# Patient Record
Sex: Male | Born: 1950 | Race: Black or African American | Hispanic: No | Marital: Married | Smoking: Never smoker
Health system: Southern US, Community
[De-identification: ages and names within clinical notes are randomized; demographics above are authoritative.]

## PROBLEM LIST (undated history)

## (undated) DIAGNOSIS — C801 Malignant (primary) neoplasm, unspecified: Secondary | ICD-10-CM

## (undated) DIAGNOSIS — E669 Obesity, unspecified: Secondary | ICD-10-CM

## (undated) DIAGNOSIS — Q791 Other congenital malformations of diaphragm: Secondary | ICD-10-CM

## (undated) DIAGNOSIS — N400 Enlarged prostate without lower urinary tract symptoms: Secondary | ICD-10-CM

## (undated) DIAGNOSIS — R7303 Prediabetes: Secondary | ICD-10-CM

## (undated) DIAGNOSIS — G4733 Obstructive sleep apnea (adult) (pediatric): Secondary | ICD-10-CM

## (undated) DIAGNOSIS — I5189 Other ill-defined heart diseases: Secondary | ICD-10-CM

## (undated) DIAGNOSIS — Z972 Presence of dental prosthetic device (complete) (partial): Secondary | ICD-10-CM

## (undated) DIAGNOSIS — E7800 Pure hypercholesterolemia, unspecified: Secondary | ICD-10-CM

## (undated) DIAGNOSIS — I10 Essential (primary) hypertension: Secondary | ICD-10-CM

## (undated) DIAGNOSIS — K08109 Complete loss of teeth, unspecified cause, unspecified class: Secondary | ICD-10-CM

## (undated) DIAGNOSIS — K219 Gastro-esophageal reflux disease without esophagitis: Secondary | ICD-10-CM

## (undated) DIAGNOSIS — Z87442 Personal history of urinary calculi: Secondary | ICD-10-CM

## (undated) DIAGNOSIS — E559 Vitamin D deficiency, unspecified: Secondary | ICD-10-CM

## (undated) DIAGNOSIS — I5032 Chronic diastolic (congestive) heart failure: Secondary | ICD-10-CM

## (undated) DIAGNOSIS — G4730 Sleep apnea, unspecified: Secondary | ICD-10-CM

## (undated) DIAGNOSIS — E119 Type 2 diabetes mellitus without complications: Secondary | ICD-10-CM

## (undated) DIAGNOSIS — M1990 Unspecified osteoarthritis, unspecified site: Secondary | ICD-10-CM

## (undated) HISTORY — PX: COLONOSCOPY: SHX174

---

## 1994-02-17 DIAGNOSIS — C642 Malignant neoplasm of left kidney, except renal pelvis: Secondary | ICD-10-CM

## 1994-02-17 DIAGNOSIS — C801 Malignant (primary) neoplasm, unspecified: Secondary | ICD-10-CM

## 1994-02-17 HISTORY — DX: Malignant (primary) neoplasm, unspecified: C80.1

## 1994-02-17 HISTORY — DX: Malignant neoplasm of left kidney, except renal pelvis: C64.2

## 1994-02-17 HISTORY — PX: OTHER SURGICAL HISTORY: SHX169

## 2004-08-16 ENCOUNTER — Ambulatory Visit: Payer: Self-pay | Admitting: Gastroenterology

## 2004-09-17 ENCOUNTER — Emergency Department: Payer: Self-pay | Admitting: Emergency Medicine

## 2006-02-06 ENCOUNTER — Ambulatory Visit: Payer: Self-pay | Admitting: Family Medicine

## 2006-02-25 ENCOUNTER — Ambulatory Visit: Payer: Self-pay

## 2007-02-21 ENCOUNTER — Emergency Department: Payer: Self-pay | Admitting: Emergency Medicine

## 2007-02-21 ENCOUNTER — Other Ambulatory Visit: Payer: Self-pay

## 2007-07-23 ENCOUNTER — Ambulatory Visit: Payer: Self-pay | Admitting: Family Medicine

## 2007-08-10 ENCOUNTER — Ambulatory Visit: Payer: Self-pay | Admitting: Family Medicine

## 2007-08-18 ENCOUNTER — Ambulatory Visit: Payer: Self-pay | Admitting: Oncology

## 2007-08-30 ENCOUNTER — Ambulatory Visit: Payer: Self-pay | Admitting: Oncology

## 2007-09-18 ENCOUNTER — Ambulatory Visit: Payer: Self-pay | Admitting: Oncology

## 2009-01-06 IMAGING — CR METASTATIC BONE SURVEY
1 series · 8 of 8 positions shown · non-contrast
Comparison: none

REASON FOR EXAM: mgus
COMMENTS:

[Series 1: view not recorded · 0.17mm/px · 8 of 21 slices shown]
[im 1/21]
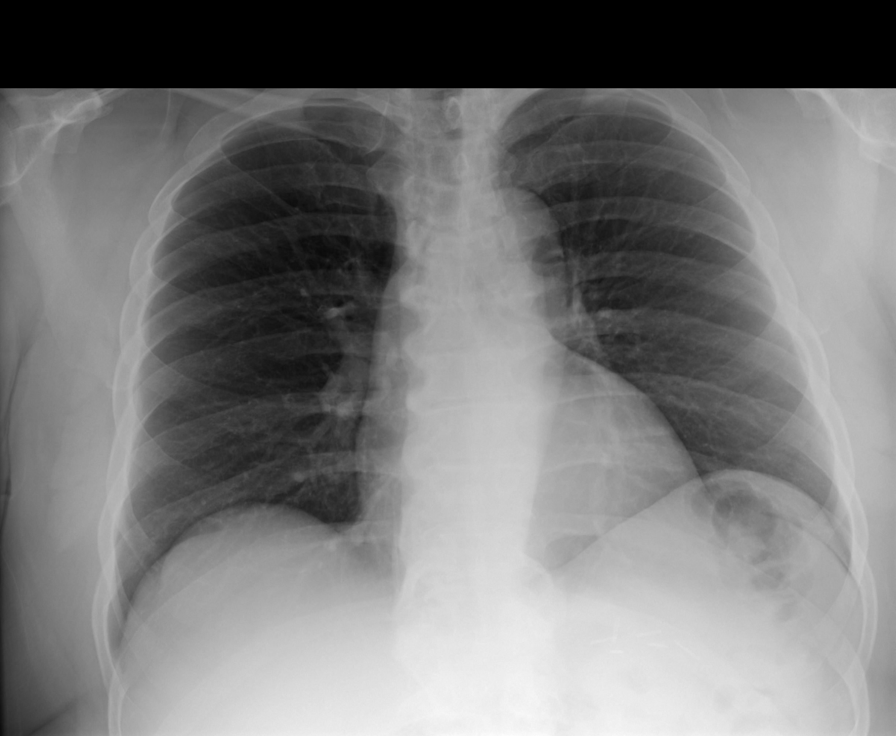
[im 3/21]
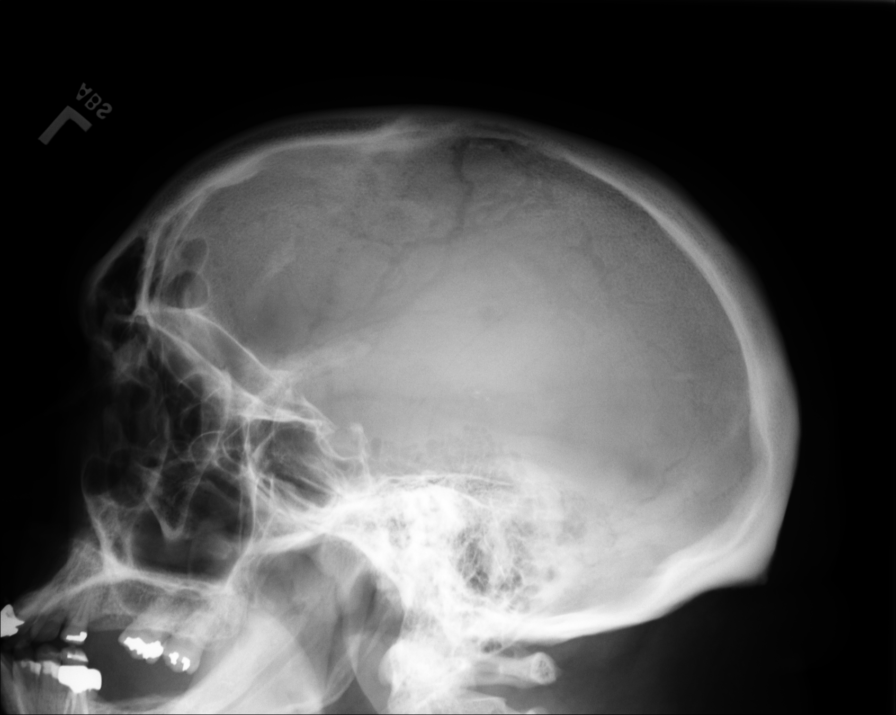
[im 6/21]
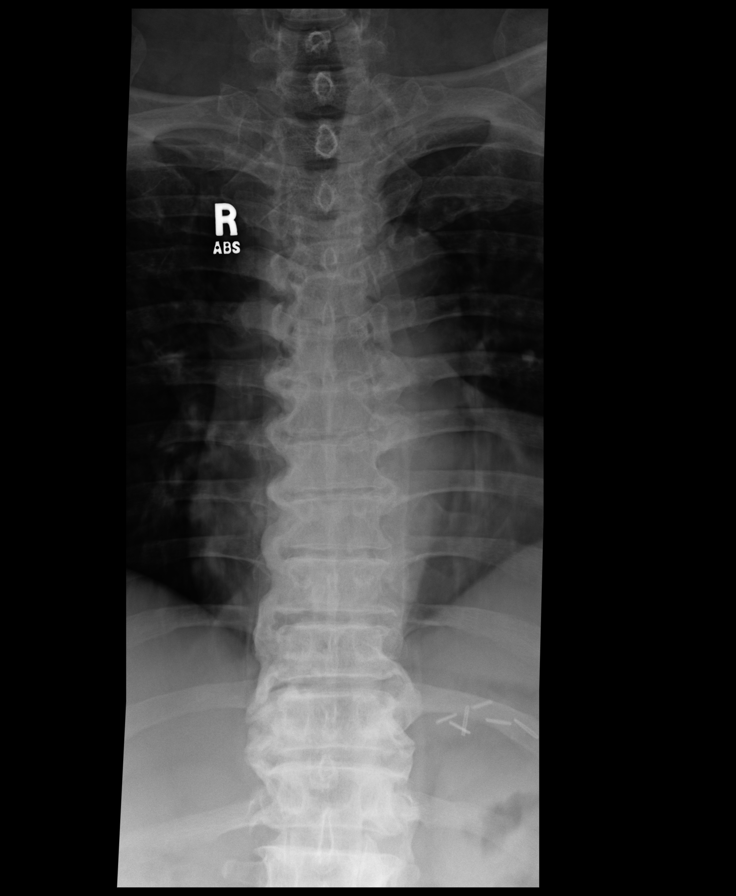
[im 9/21]
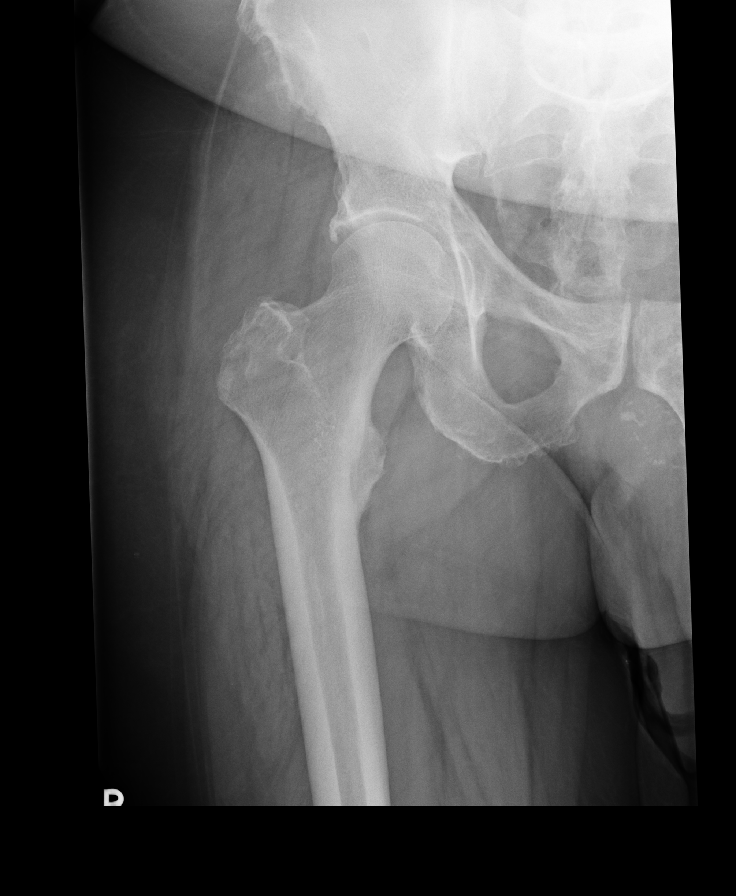
[im 12/21]
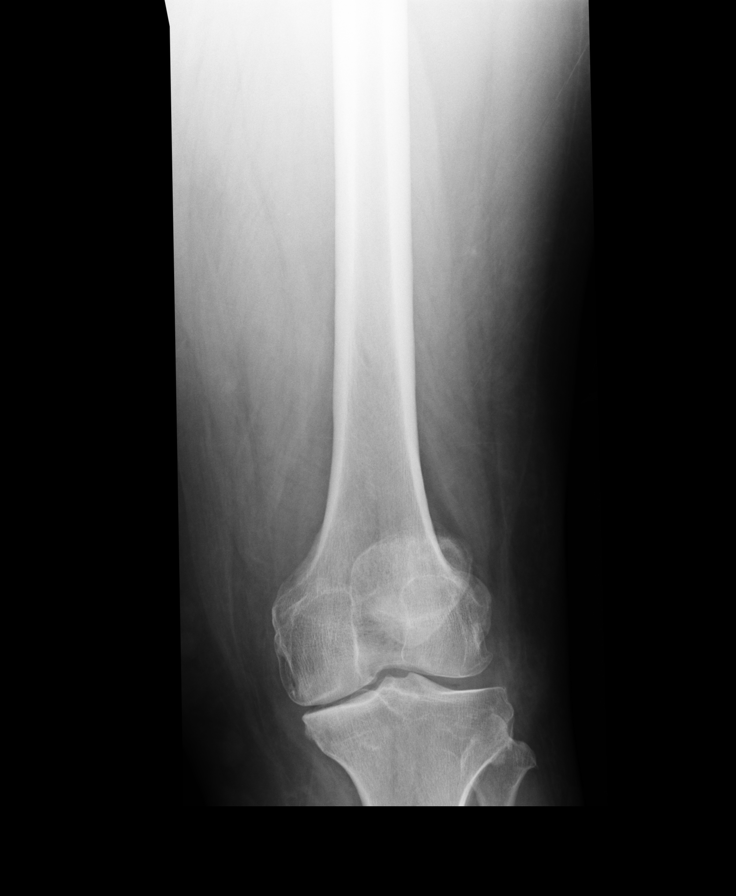
[im 15/21]
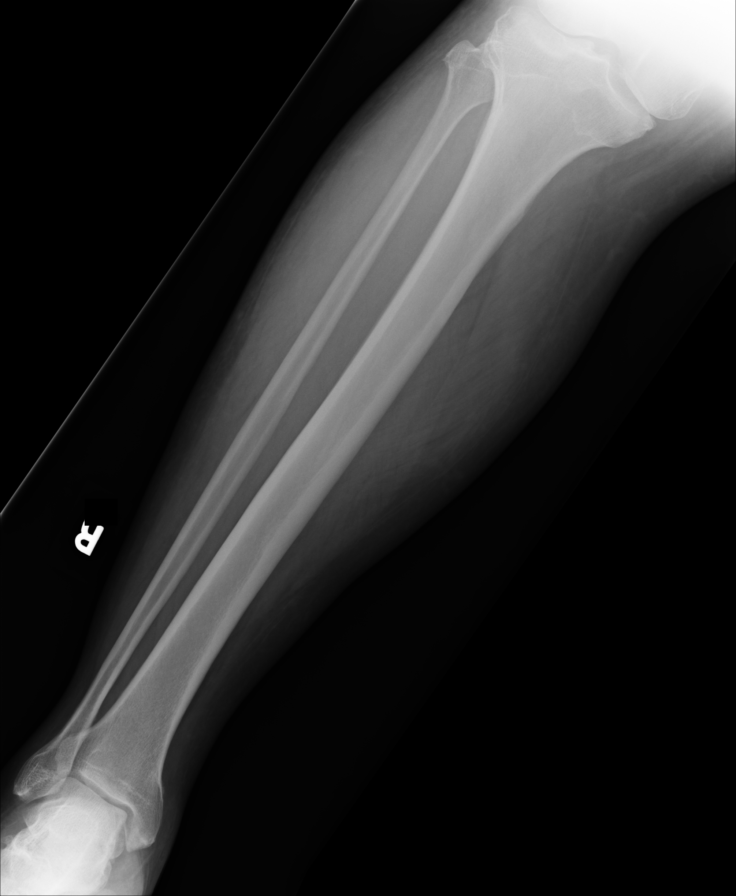
[im 18/21]
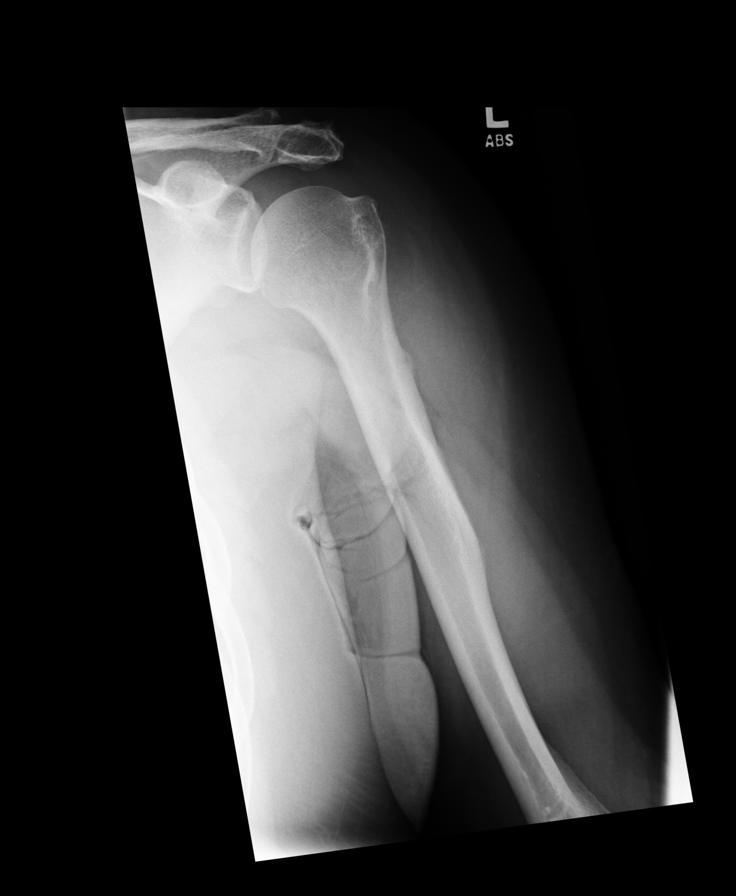
[im 21/21]
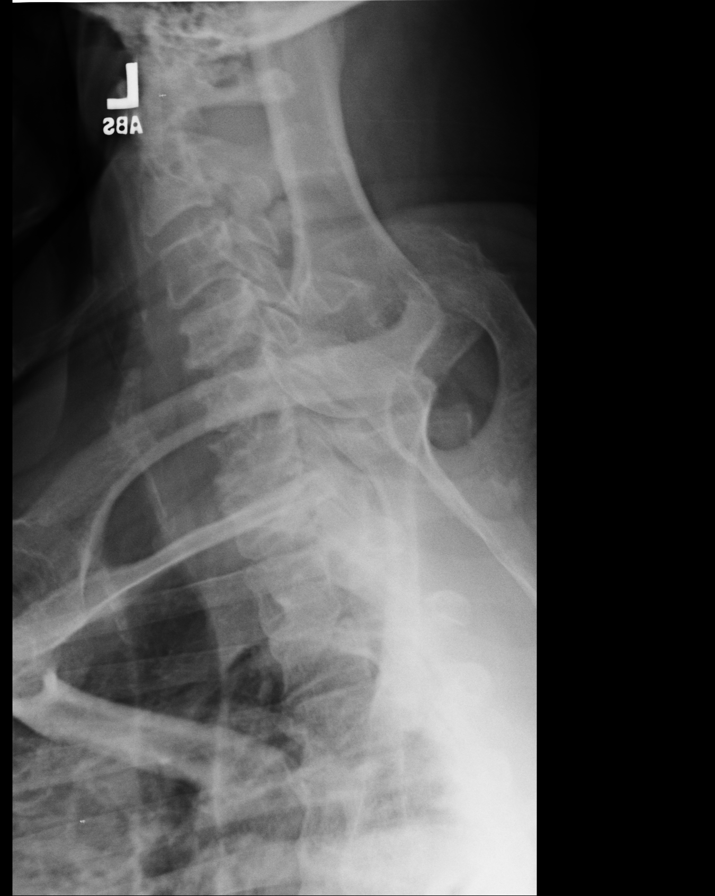

[8 of 8 positions shown; findings below may reference images not displayed]

PROCEDURE:     DXR - DXR BONE SURVEY METASTATIC  - August 30, 2007  [DATE]

RESULT:     No evidence of focal bony lesion to suggest metastasis or
metastatic disease. Nevertheless, a whole bone scan and serum studies for
myeloma should be considered if either of these two conditions is suspected.
 Diffuse degenerative change is present.  This is particularly prominent in
the spine.
IMPRESSION: 1.     Negative study for metastatic disease or myeloma.  However, whole
body bone scan and serum protein electrophoresis may prove useful for
further evaluation.
2.     Diffuse degenerative changes.
3.     Also incidental note is made of carotid atherosclerotic vascular
disease.

## 2013-02-17 DIAGNOSIS — C61 Malignant neoplasm of prostate: Secondary | ICD-10-CM

## 2013-02-17 HISTORY — DX: Malignant neoplasm of prostate: C61

## 2013-02-17 HISTORY — PX: OTHER SURGICAL HISTORY: SHX169

## 2013-04-20 LAB — URINALYSIS, COMPLETE
BACTERIA: NONE SEEN
Bilirubin,UR: NEGATIVE
Blood: NEGATIVE
Glucose,UR: NEGATIVE mg/dL (ref 0–75)
KETONE: NEGATIVE
Leukocyte Esterase: NEGATIVE
Nitrite: NEGATIVE
PH: 5 (ref 4.5–8.0)
Protein: NEGATIVE
RBC,UR: <1 /HPF (ref 0–5)
Specific Gravity: 1.021 (ref 1.003–1.030)
Squamous Epithelial: NONE SEEN
WBC UR: <1 /HPF (ref 0–5)

## 2013-04-20 LAB — CBC
HCT: 43.7 % (ref 40.0–52.0)
HGB: 14.1 g/dL (ref 13.0–18.0)
MCH: 30.8 pg (ref 26.0–34.0)
MCHC: 32.2 g/dL (ref 32.0–36.0)
MCV: 96 fL (ref 80–100)
Platelet: 193 10*3/uL (ref 150–440)
RBC: 4.57 10*6/uL (ref 4.40–5.90)
RDW: 14.9 % — ABNORMAL HIGH (ref 11.5–14.5)
WBC: 6.6 10*3/uL (ref 3.8–10.6)

## 2013-04-20 LAB — COMPREHENSIVE METABOLIC PANEL
ALK PHOS: 72 U/L
AST: 18 U/L (ref 15–37)
Albumin: 3.6 g/dL (ref 3.4–5.0)
Anion Gap: 6 — ABNORMAL LOW (ref 7–16)
BILIRUBIN TOTAL: 0.4 mg/dL (ref 0.2–1.0)
BUN: 15 mg/dL (ref 7–18)
CREATININE: 1.26 mg/dL (ref 0.60–1.30)
Calcium, Total: 9.5 mg/dL (ref 8.5–10.1)
Chloride: 103 mmol/L (ref 98–107)
Co2: 28 mmol/L (ref 21–32)
EGFR (African American): >60
EGFR (Non-African Amer.): 60 — ABNORMAL LOW
Glucose: 101 mg/dL — ABNORMAL HIGH (ref 65–99)
OSMOLALITY: 275 (ref 275–301)
POTASSIUM: 3.5 mmol/L (ref 3.5–5.1)
SGPT (ALT): 23 U/L (ref 12–78)
Sodium: 137 mmol/L (ref 136–145)
Total Protein: 8.2 g/dL (ref 6.4–8.2)

## 2013-04-20 LAB — LIPASE, BLOOD: Lipase: 131 U/L (ref 73–393)

## 2013-04-20 LAB — TROPONIN I: Troponin-I: <0.02 ng/mL

## 2013-04-21 ENCOUNTER — Observation Stay: Payer: Self-pay | Admitting: Internal Medicine

## 2013-04-21 LAB — CK-MB
CK-MB: 1.8 ng/mL (ref 0.5–3.6)
CK-MB: 1.6 ng/mL (ref 0.5–3.6)
CK-MB: 1.5 ng/mL (ref 0.5–3.6)

## 2013-04-21 LAB — TROPONIN I

## 2013-07-10 ENCOUNTER — Ambulatory Visit: Payer: Self-pay

## 2013-07-10 LAB — COMPREHENSIVE METABOLIC PANEL
Albumin: 3.6 g/dL (ref 3.4–5.0)
Alkaline Phosphatase: 82 U/L
Anion Gap: 12 (ref 7–16)
BILIRUBIN TOTAL: 0.4 mg/dL (ref 0.2–1.0)
BUN: 10 mg/dL (ref 7–18)
CALCIUM: 9.5 mg/dL (ref 8.5–10.1)
CHLORIDE: 101 mmol/L (ref 98–107)
Co2: 28 mmol/L (ref 21–32)
Creatinine: 1.19 mg/dL (ref 0.60–1.30)
EGFR (African American): >60
EGFR (Non-African Amer.): >60
GLUCOSE: 137 mg/dL — AB (ref 65–99)
Osmolality: 282 (ref 275–301)
Potassium: 4 mmol/L (ref 3.5–5.1)
SGOT(AST): 22 U/L (ref 15–37)
SGPT (ALT): 28 U/L (ref 12–78)
SODIUM: 141 mmol/L (ref 136–145)
TOTAL PROTEIN: 8.4 g/dL — AB (ref 6.4–8.2)

## 2013-07-10 LAB — CBC WITH DIFFERENTIAL/PLATELET
Basophil #: 0 10*3/uL (ref 0.0–0.1)
Basophil %: 0.3 %
EOS PCT: 0 %
Eosinophil #: 0 10*3/uL (ref 0.0–0.7)
HCT: 44.8 % (ref 40.0–52.0)
HGB: 15 g/dL (ref 13.0–18.0)
LYMPHS ABS: 0.9 10*3/uL — AB (ref 1.0–3.6)
LYMPHS PCT: 9.8 %
MCH: 31.8 pg (ref 26.0–34.0)
MCHC: 33.4 g/dL (ref 32.0–36.0)
MCV: 95 fL (ref 80–100)
MONOS PCT: 4.4 %
Monocyte #: 0.4 x10 3/mm (ref 0.2–1.0)
Neutrophil #: 7.5 10*3/uL — ABNORMAL HIGH (ref 1.4–6.5)
Neutrophil %: 85.5 %
Platelet: 199 10*3/uL (ref 150–440)
RBC: 4.71 10*6/uL (ref 4.40–5.90)
RDW: 15.1 % — ABNORMAL HIGH (ref 11.5–14.5)
WBC: 8.8 10*3/uL (ref 3.8–10.6)

## 2013-07-10 LAB — SEDIMENTATION RATE: Erythrocyte Sed Rate: 15 mm/hr (ref 0–20)

## 2013-07-10 LAB — URIC ACID: Uric Acid: 5.5 mg/dL (ref 3.5–7.2)

## 2013-07-20 DIAGNOSIS — R972 Elevated prostate specific antigen [PSA]: Secondary | ICD-10-CM | POA: Insufficient documentation

## 2013-09-02 ENCOUNTER — Ambulatory Visit: Payer: Self-pay | Admitting: Radiation Oncology

## 2014-04-06 DIAGNOSIS — M170 Bilateral primary osteoarthritis of knee: Secondary | ICD-10-CM | POA: Insufficient documentation

## 2014-06-10 NOTE — Discharge Summary (Signed)
PATIENT NAME:  Colin Mccoy, Colin Mccoy MR#:  [id number] DATE OF BIRTH:  November 14, 1950  DATE OF ADMISSION:  04/21/2013 DATE OF DISCHARGE:  04/21/2013  ADMITTING DIAGNOSIS: Chest pain, dizziness.   DISCHARGE DIAGNOSES:  1.  Chest pain was very atypical, seen by cardiology. He has had a recent stress test within the past few months that has been negative.  2.  Dizziness felt to be due to ongoing upper respiratory sinus-related issues. No evidence of arrhythmia or bradycardia.  3.  Morbid obesity.  4.  Constipation.  5.  History of kidney cancer on the left side.  6.  Left partial nephrectomy. 7.  Abnormal chest x-ray. Needs a followup CT and will let his primary care MD arrange this.  CONSULTANTS: Dr. Saralyn Pilar.  PERTINENT LABS AND EVALUATIONS: LFTs were normal. WBC count was normal. Urinalysis was negative for nitrates and leukocytes. Glucose 101, anion gap 6. Chest x-ray showed no acute cardiopulmonary processes. Right upper lobe nodular densities may represent scarring. EKG showed normal sinus rhythm and nonspecific ST-T wave changes. His troponin was less than 0.02 x 3.    HOSPITAL COURSE: The patient is a 64 year old African American male with previous history of having chest pain, who has had a negative stress test. He presented with multiple complaints, including dizziness, abdominal discomfort and bloating sensation. He was seen in the ED and was admitted. The patient had serial cardiac enzymes. The cardiac enzymes remained negative. He was seen by cardiology. They did not feel that his symptoms were cardiac related. He has had some upper airway congestion and other symptoms that could be causing the dizziness. The patient was placed under observation. Serial cardiac enzymes were checked and they were negative. He was ambulated. He was doing much better. He will be treated with supportive care. If no improvement, then he will need ENT and a GI evaluation. At this time, he is stable for discharge.    DISCHARGE MEDICATIONS: The patient was not able to provide [place] with his blood pressure medication and reflux medication, which he is supposed to continue. Along with that, he will be on aspirin 81, 1 tab p.o. daily, Mylanta maximum strength 5 mL 4 times a day as needed, Flonase 1 spray nasally daily to each nostril, Gas-X 1 tab 4 times a day p.r.n. for gas.   DIET: Low-sodium, low-fat, low-cholesterol.   ACTIVITY: As tolerated.   FOLLOWUP: With primary M.D. in 1 to 2 weeks. Follow up with Dr. Saralyn Pilar in 1 to 2 weeks.   TIME SPENT: 35 minutes.  ____________________________ Lafonda Mosses Posey Pronto, MD shp:aw D: 04/22/2013 08:27:12 ET T: 04/22/2013 08:57:57 ET JOB#: [id number]  cc: Bonham Zingale H. Posey Pronto, MD, <Dictator> Alric Seton MD ELECTRONICALLY SIGNED 04/26/2013 15:30

## 2014-06-10 NOTE — Consult Note (Signed)
PATIENT NAME:  Colin Mccoy, Colin Mccoy MR#:  [id number] DATE OF BIRTH:  11-29-1950  DATE OF CONSULTATION:  04/21/2013  REFERRING PHYSICIAN:  [place]. CONSULTING PHYSICIAN:  Isaias Cowman, MD  CHIEF COMPLAINT: Dizziness.   REASON FOR CONSULTATION: Consultation requested for evaluation of chest pain.   HISTORY OF PRESENT ILLNESS: The patient is a 64 year old gentleman with history of hypertension, hyperlipidemia and obesity. The patient reports that he was in his usual state of health until this past week, when he has had intermittent episodes of dizziness as well as vertigo. The patient reports that 3 days prior to admission, the patient was laying in bed and he felt like the room was spinning around. Yesterday, the patient experienced severe dizziness and presented to [facility] Emergency Room. In the Emergency Room, the patient did report that he has had some intermittent chest discomfort as well as abdominal discomfort with associated belching and burning. EKG was nondiagnostic. The patient was admitted to telemetry where he has ruled out for myocardial infarction by CPK, isoenzymes and troponin. The patient currently is chest pain free. The patient has undergone previous cardiac catheterization, which did not reveal evidence for significant coronary artery disease.   PAST MEDICAL HISTORY:  1.  Hypertension.  2.  Hyperlipidemia.  3.  Obesity.  4.  Gastroesophageal reflux disease.   MEDICATIONS: Aspirin 81 mg daily, blood pressure medication, acid reflux medication and cholesterol medication; however, the patient was unable to give definitive names of drugs for dosing.   SOCIAL HISTORY: The patient is married and resides with his wife. He denies tobacco abuse. He drinks 1 beer per day.   FAMILY HISTORY: No immediate family history for coronary artery disease or myocardial infarction.  REVIEW OF SYSTEMS: CONSTITUTIONAL: No fever or chills. The patient does have dizziness as described above. EYES:  No blurry vision. EARS: No hearing loss. RESPIRATORY: No shortness of breath. CARDIOVASCULAR: The patient does have some chest discomfort, which radiates up both from his abdomen. GASTROINTESTINAL: The patient complains of abdominal discomfort and bloating with belching. GENITOURINARY: No dysuria or hematuria. ENDOCRINE: No polyuria or polydipsia. MUSCULOSKELETAL: No arthralgias or myalgias. NEUROLOGICAL: No focal muscle weakness or numbness. The patient does have vertigo and dizziness as described above. PSYCHIATRIC: No depression or anxiety.   PHYSICAL EXAMINATION:  VITAL SIGNS: Blood pressure 116/77, pulse 60, respirations 20, temperature 98.4, pulse oximetry 93%.  HEENT: Pupils equal and reactive to light and accommodation.  NECK: Supple without thyromegaly.  LUNGS: Clear.  HEART: Normal JVP. Normal PMI. Regular rate and rhythm. Normal S1, S2. No appreciable gallop, murmur, or rub.  ABDOMEN: Soft and nontender. Pulses were intact bilaterally.  MUSCULOSKELETAL: Normal muscle tone.  NEUROLOGIC: The patient is alert and oriented x 3. Motor and sensory both grossly intact.   IMPRESSION: A 64 year old gentleman with known normal coronary anatomy by previous cardiac catheterization, who presents with recent history of sinus infection, vertigo and dizziness with intermittent chest discomfort, abdominal bloating and belching with negative stress test 3 months ago.   RECOMMENDATIONS:  1.  I agree with overall current therapy.  2.  Would defer full dose anticoagulation.  3.  Would defer invasive cardiac evaluation at this time in the absence of chest pain, negative troponin with known history of normal coronary anatomy.  4.  May discharge later today if the patient ambulates without difficulty. I will see him in follow-up at which time may consider GI evaluation for persistent abdominal belching, bloating and discomfort consistent with gastroesophageal reflux disease. The patient  may also benefit with  ENT evaluation for recent sinus infection and vertigo. ____________________________ Isaias Cowman, MD ap:aw D: 04/21/2013 09:09:47 ET T: 04/21/2013 09:30:44 ET JOB#: [id number]  cc: Isaias Cowman, MD, <Dictator> Isaias Cowman MD ELECTRONICALLY SIGNED 04/22/2013 13:15

## 2014-06-10 NOTE — H&P (Signed)
PATIENT NAME:  Colin Mccoy, Colin Mccoy MR#:  [id number] DATE OF BIRTH:  [birth year]  DATE OF ADMISSION:  04/21/2013  PRIMARY CARE PHYSICIAN:  Dr. Debbora Dus.  REFERRING PHYSICIAN:  Dr. Beather Arbour.   PRIMARY CARDIOLOGIST:  Dr. Saralyn Pilar.   CHIEF COMPLAINT:  Chest pain, severe dizziness.   HISTORY OF PRESENT ILLNESS:  The patient is a 64 year old African American male with past medical history of hypertension, hyperlipidemia and obesity, sees Dr. Saralyn Pilar as an outpatient, is presenting to the ER with a chief complaint of chest discomfort.  The patient is reporting that he has been having intermittent episodes of chest discomfort associated with dizziness for the past one week.  Denies any loss of consciousness.  Not associated with any shortness of breath and no radiation of the pain.  He denies any diaphoresis.  The patient is reporting that he was seen by Dr. Saralyn Pilar approximately three months ago and had a stress test done which was normal.  In the ER, the patient's initial troponin is negative.  The patient has received aspirin, nitroglycerin and Lovenox 1 mg/kg subQ was given by the ER physician.  During my examination, the patient is feeling comfortable and denies any chest pain or shortness of breath.  The patient's brother-in-law is at bedside.   PAST MEDICAL HISTORY:  Hypertension, hyperlipidemia and obesity, constipation, history of kidney cancer on the left side.   PAST SURGICAL HISTORY:  Left partial nephrectomy.   ALLERGIES:  No known drug allergies.   PSYCHOSOCIAL HISTORY:  Lives at home with wife.  Drinks one beer per day.  Denies any illicit drug usage.  Denies any history of smoking.   FAMILY HISTORY:  Mother had history of colon cancer.  Father with prostate cancer and coronary artery disease.   HOME MEDICATIONS:  Aspirin 81 mg once daily.  Acid reflux medicine, BP medication.  Cholesterol medication.  Stool softener.  Names and frequencies were not documented.   REVIEW OF SYSTEMS:   CONSTITUTIONAL:  Denies fever, fatigue, weakness.  EYES:  Denies blurry vision, glaucoma, cataracts.  EARS, NOSE, THROAT:  Denies epistaxis, discharge.  RESPIRATION:  Denies cough, COPD.  CARDIOVASCULAR:  Complaining of chest discomfort with dizziness.  Denies any palpitations.  GASTROINTESTINAL:  Denies nausea, vomiting, diarrhea, or abdominal pain.  Complaining of abdominal discomfort.  Feels bloated.  GENITOURINARY:  No dysuria or hematuria.  ENDOCRINE:  Denies polyuria, nocturia, thyroid problems.  The patient is obese.  HEMATOLOGIC AND LYMPHATIC:  No anemia, easy bruising, bleeding.  INTEGUMENTARY:  No acne, rash, lesions.  MUSCULOSKELETAL:  No joint pain in the neck and back.  Denies gout.  NEUROLOGIC:  No vertigo, ataxia.  PSYCHIATRIC:  No ADD, OCD.   PHYSICAL EXAMINATION: VITAL SIGNS:  Temperature 98.4, pulse 55, respirations 16 to 18, blood pressure 126/80, pulse ox 98% on 2 liters.  GENERAL APPEARANCE:  Not under acute distress.  Moderately built and obese.  HEENT:  Normocephalic, atraumatic.  Pupils are equally reacting to light and accommodation.  No scleral icterus.  No conjunctival injection.  No sinus tenderness.  No postnasal drip.  NECK:  Supple.  No JVD.  No thyromegaly.  Moist mucous membranes.  LUNGS:  Clear to auscultation bilaterally.  No accessory muscle usage.  No anterior chest wall tenderness on palpation. CARDIAC:  S1, S2 normal.  Regular rate and rhythm.  No murmurs.  GASTROINTESTINAL:  Soft.  Bowel sounds are positive in all four quadrants.  Obese, nontender, nondistended.  No hepatosplenomegaly.  No masses felt.  Abdomen  is distended.  NEUROLOGIC:  Awake, alert, oriented x 3.  Motor and sensory grossly intact.  Cranial nerves II through XII are intact.  Reflexes are 2+.  EXTREMITIES:  No edema.  No cyanosis.  No clubbing.  SKIN:  Warm to touch.  Normal turgor.  No rashes.  No lesions.  MUSCULOSKELETAL:  No joint effusion, tenderness, erythema.  PSYCHIATRIC:   Normal mood and affect.   LABORATORY AND IMAGING STUDIES:  LFTs are normal.  Troponin is normal.  CBC is normal.  Urinalysis yellow in color, clear in appearance.  Bilirubin and ketones are negative, specific gravity 1.021, nitrites and leukocyte esterase are negative.  Chem-8, glucose is at 101.  Anion gap is at 6.  The rest of the Chem-8 is normal.  Chest x-ray PA and lateral:  No acute cardiopulmonary disease,  right upper lobe nodular density may represent scarring or pulmonary nodule.  Follow-up noncontrast CT chest is recommended.  A 12 lead EKG:  Normal sinus rhythm at 71 beats per minute.  Normal PR and QRS interval.  No acute ST-T wave changes.   ASSESSMENT AND PLAN:  A 64 year old African American male presenting to the ER with a chief complaint of one week history of intermittent episodes of chest discomfort associated with dizziness and no loss of consciousness will be admitted with the following assessment and plan.  1.  Chest pain, rule out acute myocardial infarction.  We will admit him to telemetry.  Cycle cardiac biomarkers.  Acute coronary syndrome protocol with oxygen, nitroglycerin, aspirin, beta blocker and statin.  Cardiology consult is placed to Dr. Saralyn Pilar.  The patient is reporting that recent stress test done three months ago was normal.  2.  Hypertension.  Blood pressure is stable at this time.  The patient will be on beta blocker, metoprolol 25 mg by mouth twice daily and we will resume his home medications once medications are reconciled.  3.  Hyperlipidemia.  The patient will be on statin and we will check fasting lipid panel.  4.  History of pulmonary nodule.  Radiologist is recommending a noncontrast CT of the chest as a follow-up on a non-emergent basis.  5.  Obesity.  The patient will be benefited with diet and lifestyle modifications.  6.  CODE STATUS:  HE IS FULL CODE.   Plan of care discussed with the patient.  Total time spent on the admission is 45 minutes.     ____________________________ Nicholes Mango, MD ag:ea D: 04/21/2013 01:59:38 ET T: 04/21/2013 03:55:08 ET JOB#: [id number]  cc: Nicholes Mango, MD, <Dictator> Nicholes Mango MD ELECTRONICALLY SIGNED 04/27/2013 4:48

## 2014-09-29 DIAGNOSIS — K219 Gastro-esophageal reflux disease without esophagitis: Secondary | ICD-10-CM | POA: Insufficient documentation

## 2014-09-29 DIAGNOSIS — C61 Malignant neoplasm of prostate: Secondary | ICD-10-CM | POA: Insufficient documentation

## 2014-09-29 DIAGNOSIS — R079 Chest pain, unspecified: Secondary | ICD-10-CM | POA: Insufficient documentation

## 2014-09-29 DIAGNOSIS — R5383 Other fatigue: Secondary | ICD-10-CM | POA: Insufficient documentation

## 2014-09-29 DIAGNOSIS — E6609 Other obesity due to excess calories: Secondary | ICD-10-CM | POA: Insufficient documentation

## 2014-12-29 DIAGNOSIS — R300 Dysuria: Secondary | ICD-10-CM | POA: Insufficient documentation

## 2015-01-30 ENCOUNTER — Observation Stay
Admission: EM | Admit: 2015-01-30 | Discharge: 2015-02-01 | Disposition: A | Discharge status: Home / Self Care | Payer: Medicare Other | Attending: Internal Medicine | Admitting: Internal Medicine

## 2015-01-30 ENCOUNTER — Emergency Department: Payer: Medicare Other

## 2015-01-30 ENCOUNTER — Other Ambulatory Visit: Payer: Self-pay

## 2015-01-30 ENCOUNTER — Encounter: Payer: Self-pay | Admitting: Medical Oncology

## 2015-01-30 DIAGNOSIS — J0130 Acute sphenoidal sinusitis, unspecified: Secondary | ICD-10-CM | POA: Diagnosis not present

## 2015-01-30 DIAGNOSIS — K5900 Constipation, unspecified: Secondary | ICD-10-CM | POA: Insufficient documentation

## 2015-01-30 DIAGNOSIS — M1990 Unspecified osteoarthritis, unspecified site: Secondary | ICD-10-CM | POA: Insufficient documentation

## 2015-01-30 DIAGNOSIS — Z800 Family history of malignant neoplasm of digestive organs: Secondary | ICD-10-CM | POA: Insufficient documentation

## 2015-01-30 DIAGNOSIS — R42 Dizziness and giddiness: Secondary | ICD-10-CM | POA: Insufficient documentation

## 2015-01-30 DIAGNOSIS — M542 Cervicalgia: Secondary | ICD-10-CM | POA: Insufficient documentation

## 2015-01-30 DIAGNOSIS — E7800 Pure hypercholesterolemia, unspecified: Secondary | ICD-10-CM | POA: Diagnosis not present

## 2015-01-30 DIAGNOSIS — Z801 Family history of malignant neoplasm of trachea, bronchus and lung: Secondary | ICD-10-CM | POA: Insufficient documentation

## 2015-01-30 DIAGNOSIS — Z905 Acquired absence of kidney: Secondary | ICD-10-CM | POA: Insufficient documentation

## 2015-01-30 DIAGNOSIS — I10 Essential (primary) hypertension: Secondary | ICD-10-CM | POA: Diagnosis not present

## 2015-01-30 DIAGNOSIS — Z9842 Cataract extraction status, left eye: Secondary | ICD-10-CM | POA: Insufficient documentation

## 2015-01-30 DIAGNOSIS — Z7982 Long term (current) use of aspirin: Secondary | ICD-10-CM | POA: Diagnosis not present

## 2015-01-30 DIAGNOSIS — Z79899 Other long term (current) drug therapy: Secondary | ICD-10-CM | POA: Diagnosis not present

## 2015-01-30 DIAGNOSIS — Z6841 Body Mass Index (BMI) 40.0 and over, adult: Secondary | ICD-10-CM | POA: Diagnosis not present

## 2015-01-30 DIAGNOSIS — N400 Enlarged prostate without lower urinary tract symptoms: Secondary | ICD-10-CM | POA: Diagnosis not present

## 2015-01-30 DIAGNOSIS — M25512 Pain in left shoulder: Secondary | ICD-10-CM | POA: Insufficient documentation

## 2015-01-30 DIAGNOSIS — Z842 Family history of other diseases of the genitourinary system: Secondary | ICD-10-CM | POA: Diagnosis not present

## 2015-01-30 DIAGNOSIS — R9431 Abnormal electrocardiogram [ECG] [EKG]: Secondary | ICD-10-CM | POA: Insufficient documentation

## 2015-01-30 DIAGNOSIS — E785 Hyperlipidemia, unspecified: Secondary | ICD-10-CM | POA: Diagnosis not present

## 2015-01-30 DIAGNOSIS — K219 Gastro-esophageal reflux disease without esophagitis: Secondary | ICD-10-CM | POA: Diagnosis not present

## 2015-01-30 DIAGNOSIS — Z8546 Personal history of malignant neoplasm of prostate: Secondary | ICD-10-CM | POA: Insufficient documentation

## 2015-01-30 HISTORY — DX: Unspecified osteoarthritis, unspecified site: M19.90

## 2015-01-30 HISTORY — DX: Pure hypercholesterolemia, unspecified: E78.00

## 2015-01-30 HISTORY — DX: Malignant (primary) neoplasm, unspecified: C80.1

## 2015-01-30 HISTORY — DX: Benign prostatic hyperplasia without lower urinary tract symptoms: N40.0

## 2015-01-30 HISTORY — DX: Essential (primary) hypertension: I10

## 2015-01-30 HISTORY — DX: Gastro-esophageal reflux disease without esophagitis: K21.9

## 2015-01-30 LAB — BASIC METABOLIC PANEL
Anion gap: 12 (ref 5–15)
BUN: 11 mg/dL (ref 6–20)
CALCIUM: 9.2 mg/dL (ref 8.9–10.3)
CHLORIDE: 103 mmol/L (ref 101–111)
CO2: 27 mmol/L (ref 22–32)
CREATININE: 0.9 mg/dL (ref 0.61–1.24)
GFR calc Af Amer: >60 mL/min (ref >60)
GFR calc non Af Amer: >60 mL/min (ref >60)
Glucose, Bld: 149 mg/dL — ABNORMAL HIGH (ref 65–99)
Potassium: 3.8 mmol/L (ref 3.5–5.1)
SODIUM: 142 mmol/L (ref 135–145)

## 2015-01-30 LAB — CBC
HCT: 41 % (ref 40.0–52.0)
Hemoglobin: 13.2 g/dL (ref 13.0–18.0)
MCH: 30.8 pg (ref 26.0–34.0)
MCHC: 32.3 g/dL (ref 32.0–36.0)
MCV: 95.4 fL (ref 80.0–100.0)
PLATELETS: 186 10*3/uL (ref 150–440)
RBC: 4.3 MIL/uL — ABNORMAL LOW (ref 4.40–5.90)
RDW: 14.7 % — AB (ref 11.5–14.5)
WBC: 8.2 10*3/uL (ref 3.8–10.6)

## 2015-01-30 LAB — URINALYSIS COMPLETE WITH MICROSCOPIC (ARMC ONLY)
BILIRUBIN URINE: NEGATIVE
Bacteria, UA: NONE SEEN
GLUCOSE, UA: NEGATIVE mg/dL
Hgb urine dipstick: NEGATIVE
Ketones, ur: NEGATIVE mg/dL
LEUKOCYTES UA: NEGATIVE
Nitrite: NEGATIVE
Protein, ur: NEGATIVE mg/dL
Specific Gravity, Urine: 1.014 (ref 1.005–1.030)
pH: 7 (ref 5.0–8.0)

## 2015-01-30 MED ORDER — DOCUSATE SODIUM 100 MG PO CAPS
100.0000 mg | ORAL_CAPSULE | Freq: Two times a day (BID) | ORAL | Status: DC
  Administered 2015-01-30 – 2015-02-01 (×4): 100 mg via ORAL
  Filled 2015-01-30 (×5): qty 1

## 2015-01-30 MED ORDER — BENAZEPRIL HCL 20 MG PO TABS
20.0000 mg | ORAL_TABLET | Freq: Every day | ORAL | Status: DC
  Administered 2015-01-31 – 2015-02-01 (×2): 20 mg via ORAL
  Filled 2015-01-30 (×2): qty 1

## 2015-01-30 MED ORDER — AMLODIPINE BESYLATE 10 MG PO TABS
10.0000 mg | ORAL_TABLET | Freq: Every day | ORAL | Status: DC
  Administered 2015-01-31 – 2015-02-01 (×2): 10 mg via ORAL
  Filled 2015-01-30 (×2): qty 1

## 2015-01-30 MED ORDER — PANTOPRAZOLE SODIUM 40 MG PO TBEC
40.0000 mg | DELAYED_RELEASE_TABLET | Freq: Every day | ORAL | Status: DC
  Administered 2015-01-30 – 2015-02-01 (×3): 40 mg via ORAL
  Filled 2015-01-30 (×3): qty 1

## 2015-01-30 MED ORDER — PRAVASTATIN SODIUM 20 MG PO TABS
80.0000 mg | ORAL_TABLET | Freq: Every day | ORAL | Status: DC
  Administered 2015-01-31: 80 mg via ORAL
  Filled 2015-01-30 (×3): qty 4

## 2015-01-30 MED ORDER — MECLIZINE HCL 25 MG PO TABS
25.0000 mg | ORAL_TABLET | Freq: Three times a day (TID) | ORAL | Status: DC
  Administered 2015-01-30 – 2015-02-01 (×6): 25 mg via ORAL
  Filled 2015-01-30 (×9): qty 2

## 2015-01-30 MED ORDER — DIAZEPAM 5 MG/ML IJ SOLN
10.0000 mg | Freq: Once | INTRAMUSCULAR | Status: AC
  Administered 2015-01-30: 10 mg via INTRAVENOUS
  Filled 2015-01-30: qty 2

## 2015-01-30 MED ORDER — ASPIRIN EC 81 MG PO TBEC
81.0000 mg | DELAYED_RELEASE_TABLET | Freq: Every day | ORAL | Status: DC
  Administered 2015-01-31 – 2015-02-01 (×2): 81 mg via ORAL
  Filled 2015-01-30 (×2): qty 1

## 2015-01-30 MED ORDER — SODIUM CHLORIDE 0.9 % IV SOLN
Freq: Once | INTRAVENOUS | Status: AC
  Administered 2015-01-30: 17:00:00 via INTRAVENOUS

## 2015-01-30 MED ORDER — TAMSULOSIN HCL 0.4 MG PO CAPS
0.4000 mg | ORAL_CAPSULE | Freq: Every day | ORAL | Status: DC
  Administered 2015-01-30 – 2015-01-31 (×2): 0.4 mg via ORAL
  Filled 2015-01-30 (×2): qty 1

## 2015-01-30 MED ORDER — POLYETHYLENE GLYCOL 3350 17 G PO PACK: 17.0000 g | PACK | Freq: Every day | ORAL | Status: DC | PRN

## 2015-01-30 MED ORDER — AMOXICILLIN-POT CLAVULANATE 875-125 MG PO TABS
1.0000 | ORAL_TABLET | Freq: Two times a day (BID) | ORAL | Status: DC
  Administered 2015-01-30 – 2015-02-01 (×5): 1 via ORAL
  Filled 2015-01-30 (×5): qty 1

## 2015-01-30 MED ORDER — ENOXAPARIN SODIUM 40 MG/0.4ML ~~LOC~~ SOLN
40.0000 mg | Freq: Two times a day (BID) | SUBCUTANEOUS | Status: DC
  Administered 2015-01-30 – 2015-02-01 (×4): 40 mg via SUBCUTANEOUS
  Filled 2015-01-30 (×4): qty 0.4

## 2015-01-30 NOTE — ED Provider Notes (Addendum)
[facility] Emergency Department Provider Note  Time seen: 11:44 AM  I have reviewed the triage vital signs and the nursing notes.   HISTORY  Chief Complaint Dizziness    HPI Colin PICARIELLO Sr. is a 64 y.o. male with a past medical history of BPH, hypertension, arthritis, gastric reflux, hyperlipidemia who presents the emergency department with acute onset of dizziness. According to the patient he was sitting in his bed, when he got up he felt acutely dizzy as if the bed was spinning around him.Denies any headache, focal weakness or numbness. Denies ear pain. Denies history of vertigo.     Past Medical History  Diagnosis Date  . BPH (benign prostatic hyperplasia)   . Cancer [facility])     Prostate CA  . Hypertension   . Arthritis   . GERD (gastroesophageal reflux disease)   . High cholesterol     There are no active problems to display for this patient.   No past surgical history on file.  No current outpatient prescriptions on file.  Allergies Review of patient's allergies indicates no known allergies.  No family history on file.  Social History Social History  Substance Use Topics  . Smoking status: Never Smoker   . Smokeless tobacco: None  . Alcohol Use: None    Review of Systems Constitutional: Negative for fever. Cardiovascular: Negative for chest pain. Respiratory: Negative for shortness of breath. Gastrointestinal: Negative for abdominal pain Musculoskeletal: Negative for back pain. Neurological: Negative for headache 10-point ROS otherwise negative.  ____________________________________________   PHYSICAL EXAM:  VITAL SIGNS: ED Triage Vitals  Enc Vitals Group     BP 01/30/15 1044 159/83 mmHg     Pulse Rate 01/30/15 1044 63     Resp 01/30/15 1044 22     Temp 01/30/15 1044 98.4 F (36.9 C)     Temp Source 01/30/15 1044 Oral     SpO2 01/30/15 1044 99 %     Weight 01/30/15 1044 350 lb (158.759 kg)     Height 01/30/15 1044  5\' 9"  (1.753 m)     Head Cir --      Peak Flow --      Pain Score 01/30/15 1106 3     Pain Loc --      Pain Edu? --      Excl. in [place]? --     Constitutional: Alert and oriented. Well appearing and in no distress. Eyes: Normal exam, beating nystagmus on exam, PERRL ENT   Head: Normocephalic and atraumatic   Mouth/Throat: Mucous membranes are moist. Cardiovascular: Normal rate, regular rhythm. No murmur Respiratory: Normal respiratory effort without tachypnea nor retractions. Breath sounds are clear  Gastrointestinal: Soft and nontender. No distention.  Musculoskeletal: Nontender with normal range of motion in all extremities. Neurologic:  Normal speech and language. No gross focal neurologic deficits Skin:  Skin is warm, dry and intact.  Psychiatric: Mood and affect are normal. Speech and behavior are normal.  ____________________________________________   RADIOLOGY  CT head negative  EKG reviewed and interpreted by myself shows normal sinus rhythm at 63 bpm, narrow QRS, left axis deviation, normal intervals, nonspecific ST changes. ____________________________________________    INITIAL IMPRESSION / ASSESSMENT AND PLAN / ED COURSE  Pertinent labs & imaging results that were available during my care of the patient were reviewed by me and considered in my medical decision making (see chart for details).  Patient presents with sudden onset of spinning sensation this morning. Patient states feels like  the room is spinning around him, somewhat better with his eyes closed. Patient has nystagmus on exam, history very suggestive of vertigo. We will check labs, CT head to rule out intracranial abnormality. We will treat with IV Valium, and closely monitor in the emergency department.  CT head is negative, labs within normal limits. Exam is very consistent with BPPV. Patient currently sleeping status post Valium administration.  Patient unable to sit up due to significant  vertigo with nausea. Symptoms still most suggestive of BPPV however the patient is unable to sit up, I do not believe he could be safely discharged home at this time. The patient will be admitted for further treatment.  ____________________________________________   FINAL CLINICAL IMPRESSION(S) / ED DIAGNOSES  Vertigo   Harvest Dark, MD 01/30/15 [place], MD 01/30/15 [phone]

## 2015-01-30 NOTE — ED Notes (Signed)
Patient c/o dizziness with the room spinning upon the arrival of this Probation officer. Patient was anxious and throwing out his arm to steady himself, states the bed is moving. Patient is alert and oriented. Wife at bedside.

## 2015-01-30 NOTE — H&P (Signed)
[facility] [place] NAME: Colin Mccoy    MR#:  [id number]  DATE OF BIRTH:  11-12-1950  DATE OF ADMISSION:  01/30/2015  PRIMARY CARE PHYSICIAN: [facility] clinic. Dr. Jeananne Rama  REQUESTING/REFERRING PHYSICIAN: [facility]  CHIEF COMPLAINT:   Chief Complaint  Patient presents with  . Dizziness    HISTORY OF PRESENT ILLNESS:  Colin Mccoy  is a 64 y.o. male with a known history of hypertension, hyperlipidemia, gastroesophageal reflux disease. He presents to the emergency room today with dizziness and room spinning starting this a.m. at 8 AM. The ER physician has been watching him for while but they have been unsuccessful on trying to get him up and walking 3 times already today. Hospitalist services were contacted for further evaluation. The patient is also having some neck pain and left shoulder pain going on for 3 weeks. When I went into the room, he was unable to lift his head up off the pillow.  PAST MEDICAL HISTORY:   Past Medical History  Diagnosis Date  . BPH (benign prostatic hyperplasia)   . Cancer [facility])     Prostate CA  . Hypertension   . Arthritis   . GERD (gastroesophageal reflux disease)   . High cholesterol     PAST SURGICAL HISTORY:   Past Surgical History  Procedure Laterality Date  . Partial kidney resection    . Prostate seed implantation      SOCIAL HISTORY:   Social History  Substance Use Topics  . Smoking status: Never Smoker   . Smokeless tobacco: Not on file  . Alcohol Use: Yes    FAMILY HISTORY:   Family History  Problem Relation Age of Onset  . Colon cancer Mother   . Lung cancer Father   . Benign prostatic hyperplasia Father     DRUG ALLERGIES:  No Known Allergies  REVIEW OF SYSTEMS:  CONSTITUTIONAL: No fever, positive for fatigue.  EYES: No blurred or double vision. Wears reading glasses EARS, NOSE, AND THROAT: No tinnitus or ear pain. No sore throat dysphagia to  solid food.  RESPIRATORY:  some cough,  no shortness of breath, wheezing or hemoptysis.  CARDIOVASCULAR: No chest pain, orthopnea, edema.  GASTROINTESTINAL: No nausea, vomiting, diarrhea or abdominal pain. No blood in bowel movements. Positive for constipation  GENITOURINARY:  occasional burning on urination  ENDOCRINE: No polyuria, nocturia,  HEMATOLOGY: No anemia, easy bruising or bleeding SKIN: No rash or lesion. MUSCULOSKELETAL: positive for joint pain and left neck and shoulder pain   NEUROLOGIC: No tingling, numbness, weakness.  PSYCHIATRY: No anxiety or depression.   MEDICATIONS AT HOME:       medication reconciliation not done. Pravastatin 80 mg daily, omeprazole 20 mg daily,, Norvasc 10 mg daily, benazepril 10 mg daily, meloxicam 15 mg daily, aspirin 81 mg daily, Colace 100 mg possibly twice a day, MiraLAX when necessary, Flomax 0.4 mg daily    VITAL SIGNS:  Blood pressure 147/87, pulse 56, temperature 98.4 F (36.9 C), temperature source Oral, resp. rate 19, height 5\' 9"  (1.753 m), weight 158.759 kg (350 lb), SpO2 100 %.  PHYSICAL EXAMINATION:  GENERAL:  64 y.o.-year-old patient lying in the bed with no acute distress.  EYES: Pupils equal, round, reactive to light and accommodation. No scleral icterus. Extraocular muscles intact.  HEENT: Head atraumatic, normocephalic. Oropharynx and nasopharynx clear.  tympanic membrane on the left bulging and erythematous  NECK:  Supple, no jugular venous distention. No thyroid enlargement,  no tenderness.  LUNGS: Normal breath sounds bilaterally, no wheezing, rales,rhonchi or crepitation. No use of accessory muscles of respiration.  CARDIOVASCULAR: S1, S2 normal. No murmurs, rubs, or gallops.  ABDOMEN: Soft, nontender, nondistended. Bowel sounds present. No organomegaly or mass.  EXTREMITIES: 2+  edema,  no cyanosis, or clubbing.  NEUROLOGIC: Cranial nerves II through XII are intact. Muscle strength 5/5 in all extremities. Sensation  intact. Gait not checked.  PSYCHIATRIC: The patient is alert and oriented x 3.  SKIN: No rash, lesion, or ulcer.   LABORATORY PANEL:   CBC  Recent Labs Lab 01/30/15 1133  WBC 8.2  HGB 13.2  HCT 41.0  PLT 186   ------------------------------------------------------------------------------------------------------------------  Chemistries   Recent Labs Lab 01/30/15 1133  NA 142  K 3.8  CL 103  CO2 27  GLUCOSE 149*  BUN 11  CREATININE 0.90  CALCIUM 9.2   ------------------------------------------------------------------------------------------------------------------    RADIOLOGY:  Ct Head Wo Contrast  01/30/2015  CLINICAL DATA:  Dizziness which began after the patient woke up this morning. No known injury. Initial encounter. EXAM: CT HEAD WITHOUT CONTRAST TECHNIQUE: Contiguous axial images were obtained from the base of the skull through the vertex without intravenous contrast. COMPARISON:  None. FINDINGS: There is no evidence of acute intracranial abnormality including hemorrhage, infarct, mass lesion, mass effect and abnormal extra-axial fluid collection. No hydrocephalus or pneumocephalus. The calvarium is intact. Imaged paranasal sinuses and mastoid air cells are clear. IMPRESSION: Negative head CT. Electronically Signed   By: Inge Rise M.D.   On: 01/30/2015 12:51    EKG:   Normal sinus rhythm 63 bpm left axis deviation   IMPRESSION AND PLAN:   1. Benign positional vertigo with otitis media on the left. Since the patient is unable to lift up his head from the pillow today, I will admit as an observation. I will give IV fluid hydration 1 L. Augmentin 875 mg twice a day. Antivert 25 mg 3 times a day. Physical therapy evaluation tomorrow. 2. Essential hypertension continue Norvasc and benazepril 3. hyperlipidemia unspecified continue pravastatin  4. BPH and history of prostate cancer on Flomax 5. Constipation on Colace and MiraLAX 6. Obesity- weight loss  needed 7. Left neck and left shoulder pain- hard to assess with the patient unable to lift his head up off the pillow without feeling dizzy. Better exam will have to be repeated tomorrow.  All the records are reviewed and case discussed with ED provider. Management plans discussed with the patient, family and they are in agreement.  CODE STATUS: Full code   TOTAL TIME TAKING CARE OF THIS PATIENT: 50  minutes.    Loletha Grayer M.D on 01/30/2015 at 3:25 PM  Between 7am to 6pm - Pager - [phone]  After 6pm call admission pager [facility]  Office  [phone]  CC: Primary care physician; [facility] clinic Dr. Jeananne Rama.

## 2015-01-30 NOTE — Progress Notes (Signed)
ANTICOAGULATION CONSULT NOTE - Initial Consult  Pharmacy Consult for Lovenox  Indication: VTE prophylaxis  No Known Allergies  Patient Measurements: Height: 5\' 9"  (175.3 cm) Weight: (!) 350 lb (158.759 kg) IBW/kg (Calculated) : 70.7 Heparin Dosing Weight:   Vital Signs: Temp: 98.4 F (36.9 C) (12/13 1044) Temp Source: Oral (12/13 1044) BP: 138/87 mmHg (12/13 1545) Pulse Rate: 53 (12/13 1545)  Labs:  Recent Labs  01/30/15 1133  HGB 13.2  HCT 41.0  PLT 186  CREATININE 0.90    Estimated Creatinine Clearance: 120.9 mL/min (by C-G formula based on Cr of 0.9).   Medical History: Past Medical History  Diagnosis Date  . BPH (benign prostatic hyperplasia)   . Cancer [facility])     Prostate CA  . Hypertension   . Arthritis   . GERD (gastroesophageal reflux disease)   . High cholesterol     Medications:  Scheduled:  . sodium chloride   Intravenous Once  . [START ON 01/31/2015] amLODipine  10 mg Oral Daily  . amoxicillin-clavulanate  1 tablet Oral Q12H  . [START ON 01/31/2015] aspirin EC  81 mg Oral Daily  . [START ON 01/31/2015] benazepril  20 mg Oral Daily  . docusate sodium  100 mg Oral BID  . enoxaparin (LOVENOX) injection  40 mg Subcutaneous Q12H  . meclizine  25 mg Oral TID  . pantoprazole  40 mg Oral Daily  . pravastatin  80 mg Oral q1800  . tamsulosin  0.4 mg Oral QPC supper    Assessment: CrCl = 120.9 ml/min TBW = 158.8 kg  BMI = 51.8  Goal of Therapy:  DVT prophylaxis   Plan:  Lovenox 40 mg SQ Q24H originally ordered. Will adjust dose to lovenox 40 mg SQ Q12H based on BMI > 40.   Senai Ramnath D 01/30/2015,4:24 PM

## 2015-01-30 NOTE — ED Notes (Signed)
Patient attempted to sit up, but was unable due to worsening dizziness.

## 2015-01-30 NOTE — ED Notes (Signed)
Patient was able to sit on side of the stretcher for 1 minute, but then had to lie down due to dizziness. Dizziness resolved when supine.

## 2015-01-30 NOTE — ED Notes (Signed)
PT reports that he woke up this am with dizziness, pt denies headache. Pt also reports that he has been having sob with exertion. Pt reports left shoulder pain x 1 month without injury.

## 2015-01-31 ENCOUNTER — Observation Stay: Payer: Medicare Other

## 2015-01-31 MED ORDER — DIAZEPAM 5 MG PO TABS: 5.0000 mg | ORAL_TABLET | Freq: Three times a day (TID) | ORAL | Status: DC | PRN

## 2015-01-31 NOTE — Evaluation (Addendum)
Physical Therapy Evaluation Patient Details Name: Colin HONEA Sr. MRN: [id number] DOB: 09-19-1950 Today's Date: 01/31/2015   History of Present Illness  Keyun Trenary is a 64 y.o. male with a known history of hypertension, hyperlipidemia, gastroesophageal reflux disease. He presented to the emergency room yesterday with dizziness and vertigo. The patient is also having some neck pain and left shoulder pain going on for 3 weeks. Pt was unable to lift his head up off the pillow in the ER. ER physician indicates nystagmus on exam but does not clarify direction or quality of nystagmus. Hospitalist reports otitis media in L ear and admitted pt under observation for BPPV. Pt reports that yesterday morning he was laying still in bed when all of a sudden he started to see the room spin. Pt denies rolling or turning in bed. Symptoms lasted for 1-2 seconds initially and then the symptoms returned a second time and it lasted a couple seconds. Pt initially thought it was his blood pressure. He took his BP meds and sat in the living room. Pt went to the restroom and had a bowel movement. When he stood up from the commode he started having vertigo again. Pt reports that occasionally he has trouble focusing and he feels like his visual field is shifting. Pt reports history of L neck and shoulder pain over the last month. Pt describes his symptoms as vertigo, lightheadedness, unsteadiness. Denies syncope. He had multiple episodes yesterday which lasted seconds in duration. Pt reports stress test a couple months ago which was normal. Symptoms are motion provoked and occur with head changes and position changes. Easing factors are resting and closing his eyes. Pt states that his symptoms are improved today. He has no prior history of similar episodes. No reported falls in the last 12 months. Pt was previously independent with ADLs/IADLs without assistive device. Pt denies tinnitus, pain, or ear drainage. No diplopia or recent  changes in vision. Pt denies nausea/vomiting, dysarthria, dysphagia, drop attacks, bowel and bladder changes, recent weight loss, headache, migraines,  Clinical Impression  Upon arrival pt reports improving symptoms. Pt with normal ocular alignment and full ocular ROM. L horizontal nystagmus noted at mid range L gaze which is absent in central and R gaze. With smooth pursuit gaze appears to be mildly disconjugate. Convergence intact bilaterally. Horizontal and vertical saccades are accurate. Pt comes up to sitting independently. After sitting for 2-3 minutes pt becomes acutely "lightheaded" as well as vertiginous. Pt reports severe vertigo and starts to have spontaneous R horizontal nystagmus which persists for greater than 1 minute and then starts to convert to pure R rotational nystagmus. Nystagmus slowly dissipates. Pt comes to standing and while standing after approximately 15 seconds vertigo starts again but this time nystagmus converts to L pure horizontal. Pt returned to supine position in bed as he becomes severely unsteady reporting that he feels like "the bed is turning over." RN contacted who comes to room and observes nystagmus. Nystagmus persists for >2 minutes. MD contacted and notified of direction changing long duration nystagmus with intermittent torsional component. Highly concerning for CVA. Patient's vertigo does not occur with head turns/head positioning during evaluation. No upbeating component to nystamgus observed. Nystagmus does not follow any pattern which would be present with BPPV. VOR testing deferred at this time due to central signs and further vestibular work-up deferred. MD notes that she will order MRI. If complete neuro work-up is negative pt will need outpatient follow-up with ENT. At this time pt is severely  unsteady in standing due to vertigo. Unable/unsafe to ambulate. Pt will need SNF placement at discharge until he can ambulate safely. Pt will benefit from skilled PT  services to address deficits in strength, balance, and mobility in order to return to full function at home.       Follow Up Recommendations SNF (Pt unable to ambulate at this time due to vertigo)    Equipment Recommendations  None recommended by PT    Recommendations for Other Services       Precautions / Restrictions Precautions Precautions: Fall Restrictions Weight Bearing Restrictions: No      Mobility  Bed Mobility Overal bed mobility: Independent             General bed mobility comments: No difficulty going from supine to sitting  Transfers Overall transfer level: Needs assistance Equipment used: Rolling walker (2 wheeled) Transfers: Sit to/from Stand Sit to Stand: Min guard         General transfer comment: Pt comes from sit to stand with CGA only. Good LE strength noted during transfer. Upon standing pt reports he feels lightheaded. Waiting at EOB and then pt starts with severe vertigo forcing him to lay back down in the bed. Pt reports that he feels like the bed is "flipping over." Vertigo persists for 1-2 minutes before it improves. RN notified who comes to room and observes nystagmus.   Ambulation/Gait             General Gait Details: Unable to attempt at this time due to severe vertigo  Stairs            Wheelchair Mobility    Modified Rankin (Stroke Patients Only)       Balance Overall balance assessment: Needs assistance   Sitting balance-Leahy Scale: Good       Standing balance-Leahy Scale: Poor                               Pertinent Vitals/Pain Pain Assessment: No/denies pain    Home Living Family/patient expects to be discharged to:: Private residence Living Arrangements: Spouse/significant other;Children Available Help at Discharge: Family Type of Home: House Home Access: Stairs to enter Entrance Stairs-Rails: None Entrance Stairs-Number of Steps: 2 Home Layout: One level Home Equipment: Environmental consultant - 2  wheels;Cane - single point;Shower seat - built in (no BSC, no wheelchair, no hosptial bed)      Prior Function Level of Independence: Independent               Hand Dominance   Dominant Hand: Right (Alternates between L and R)    Extremity/Trunk Assessment   Upper Extremity Assessment: Overall WFL for tasks assessed           Lower Extremity Assessment: Overall WFL for tasks assessed         Communication   Communication: No difficulties  Cognition Arousal/Alertness: Awake/alert Behavior During Therapy: WFL for tasks assessed/performed Overall Cognitive Status: Within Functional Limits for tasks assessed                      General Comments      Exercises        Assessment/Plan    PT Assessment Patient needs continued PT services  PT Diagnosis Difficulty walking;Abnormality of gait   PT Problem List Decreased activity tolerance;Decreased balance;Decreased mobility;Obesity  PT Treatment Interventions DME instruction;Gait training;Stair training;Functional mobility training;Therapeutic activities;Therapeutic exercise;Balance training;Neuromuscular re-education;Cognitive remediation;Patient/family  education   PT Goals (Current goals can be found in the Care Plan section) Acute Rehab PT Goals Patient Stated Goal: Decrease vertigo PT Goal Formulation: With patient Time For Goal Achievement: 02/14/15 Potential to Achieve Goals: Fair    Frequency Min 2X/week   Barriers to discharge        Co-evaluation               End of Session Equipment Utilized During Treatment: Gait belt Activity Tolerance: Treatment limited secondary to medical complications (Comment) Patient left: in bed;with call bell/phone within reach;with bed alarm set;with nursing/sitter in room Nurse Communication: Mobility status;Other (comment) (Concerns regarding nystagmus)    Functional Assessment Tool Used: clinical judgement Functional Limitation: Mobility: Walking  and moving around Mobility: Walking and Moving Around Current Status [phone]): At least 60 percent but less than 80 percent impaired, limited or restricted Mobility: Walking and Moving Around Goal Status [phone]): At least 1 percent but less than 20 percent impaired, limited or restricted    Time: 0850-0935 PT Time Calculation (min) (ACUTE ONLY): 45 min   Charges:   PT Evaluation $Initial PT Evaluation Tier I: 1 Procedure PT Treatments $Neuromuscular Re-education: 8-22 mins   PT G Codes:   PT G-Codes **NOT FOR INPATIENT CLASS** Functional Assessment Tool Used: clinical judgement Functional Limitation: Mobility: Walking and moving around Mobility: Walking and Moving Around Current Status [phone]): At least 60 percent but less than 80 percent impaired, limited or restricted Mobility: Walking and Moving Around Goal Status [phone]): At least 1 percent but less than 20 percent impaired, limited or restricted   Phillips Grout PT, DPT   Regan Mcbryar 01/31/2015, 9:55 AM

## 2015-01-31 NOTE — Clinical Social Work Note (Signed)
Clinical Social Work Assessment  Patient Details  Name: Colin Mccoy. MRN: [id number] Date of Birth: 05-06-1950  Date of referral:  01/31/15               Reason for consult:  Facility Placement                Permission sought to share information with:  Chartered certified accountant granted to share information::  No  Name::        Agency::     Relationship::     Contact Information:     Housing/Transportation Living arrangements for the past 2 months:  Single Family Home Source of Information:  Patient, Spouse Patient Interpreter Needed:  None Criminal Activity/Legal Involvement Pertinent to Current Situation/Hospitalization:  No - Comment as needed Significant Relationships:  Adult Children, Spouse Lives with:  Adult Children, Spouse Do you feel safe going back to the place where you live?  Yes Need for family participation in patient care:  Yes (Comment)  Care giving concerns: Patient lives with his wife Colin Mccoy [phone] and adult son Colin Mccoy who has autism spectrum disorder.    Social Worker assessment / plan: Holiday representative (CSW) met with patient to discuss D/C plan. PT is recommending SNF because patient could not ambulate today. Patient's wife Colin Mccoy and son Moo were at bedside. Patient was alert and oriented and laying in the bed. CSW explained that PT is recommending short term rehab. Patient reported that he is not going to rehab and that he is going home. Wife reported that if patient's dizziness becomes under control she can manage patient at home. Wife reported that patient's sister and daughters live close by and can provide support. Wife reported that she works with the school system and is going on Holiday break for 2 weeks and can be with her husband 24/7 during that time. CSW asked if patient would be interested in home health. Patient reported that he does not feel like he would benefit from PT. Patient reported that he is independent  and this dizziness is new and had a sudden onset. RN Case Manager aware of above. CSW will continue to follow and assist as needed.   Employment status:  Retired Nurse, adult PT Recommendations:  [place] / Referral to community resources:  Other (Comment Required) (Patient is refusing SNF. )  Patient/Family's Response to care: Patient is refusing SNF.   Patient/Family's Understanding of and Emotional Response to Diagnosis, Current Treatment, and Prognosis: Patient was pleasant throughout assessment.   Emotional Assessment Appearance:  Appears stated age Attitude/Demeanor/Rapport:    Affect (typically observed):  Pleasant Orientation:  Oriented to Self, Oriented to Place, Oriented to  Time, Oriented to Situation Alcohol / Substance use:  Not Applicable Psych involvement (Current and /or in the community):  No (Comment)  Discharge Needs  Concerns to be addressed:  Discharge Planning Concerns Readmission within the last 30 days:  No Current discharge risk:  Dependent with Mobility Barriers to Discharge:  Continued Medical Work up   Loralyn Freshwater, LCSW 01/31/2015, 12:24 PM

## 2015-01-31 NOTE — Progress Notes (Signed)
Pts, heart rate down to 30's per telemetry monitor. Dr. Lavetta Nielsen notified and no new orders.

## 2015-01-31 NOTE — Progress Notes (Signed)
Pt called nursing to bedside this AM during session, pt experiencing severe dizziness and vertigo, states "the bed is spinning, I feel like I'm falling", noted with left rotation nystagmus. Per PT, pt began to experience symptoms when trying to stand up. PT paged MD, will update w/ conversation.

## 2015-01-31 NOTE — Care Management Note (Signed)
Case Management Note  Patient Details  Name: MAE CIANCI Sr. MRN: [id number] Date of Birth: 08/02/1950  Subjective/Objective:    Case discussed with CSW. She has met with patient and he is refusing SNF and home PT.  No additional needs identified.                Action/Plan:   Expected Discharge Date:                  Expected Discharge Plan:     In-House Referral:     Discharge planning Services     Post Acute Care Choice:    Choice offered to:     DME Arranged:    DME Agency:     HH Arranged:    [place] Agency:     Status of Service:     Medicare Important Message Given:    Date Medicare IM Given:    Medicare IM give by:    Date Additional Medicare IM Given:    Additional Medicare Important Message give by:     If discussed at [place] of Stay Meetings, dates discussed:    Additional Comments:  Jolly Mango, RN 01/31/2015, 10:44 AM

## 2015-01-31 NOTE — Progress Notes (Addendum)
Pt stated that he had similar issue with dizziness but less severe, occurred approx. 10 yrs ago. Was prescribed "equilibruim medicine" by ENT specialist at [facility]. Unable to remember MD's name or the name of medication. Pt had no further information available.

## 2015-01-31 NOTE — Progress Notes (Signed)
[facility] NAME: Treven Pegan    MR#:  [id number]  DATE OF BIRTH:  16-Nov-1950  SUBJECTIVE:   Came in with feeling dizzy and light headed. No vomting. Denies ear pain or discharge REVIEW OF SYSTEMS:   Review of Systems  Constitutional: Negative for fever, chills and weight loss.  HENT: Negative for ear discharge, ear pain and nosebleeds.   Eyes: Negative for blurred vision, pain and discharge.  Respiratory: Negative for sputum production, shortness of breath, wheezing and stridor.   Cardiovascular: Negative for chest pain, palpitations, orthopnea and PND.  Gastrointestinal: Negative for nausea, vomiting, abdominal pain and diarrhea.  Genitourinary: Negative for urgency and frequency.  Musculoskeletal: Positive for joint pain. Negative for back pain.  Neurological: Positive for dizziness and weakness. Negative for sensory change, speech change and focal weakness.  Psychiatric/Behavioral: Negative for depression and hallucinations. The patient is not nervous/anxious.   All other systems reviewed and are negative.  Tolerating Diet:yes Tolerating PT: yes  DRUG ALLERGIES:  No Known Allergies  VITALS:  Blood pressure 114/61, pulse 65, temperature 98.4 F (36.9 C), temperature source Oral, resp. rate 20, height 5\' 9"  (1.753 m), weight 350 lb (158.759 kg), SpO2 97 %.  PHYSICAL EXAMINATION:   Physical Exam  GENERAL:  64 y.o.-year-old patient lying in the bed with no acute distress. Morbidly obese EYES: Pupils equal, round, reactive to light and accommodation. No scleral icterus. Extraocular muscles intact.  HEENT: Head atraumatic, normocephalic. Oropharynx and nasopharynx clear. Nystagmus+ NECK:  Supple, no jugular venous distention. No thyroid enlargement, no tenderness.  LUNGS: Normal breath sounds bilaterally, no wheezing, rales, rhonchi. No use of accessory muscles of respiration.  CARDIOVASCULAR: S1, S2 normal. No murmurs,  rubs, or gallops.  ABDOMEN: Soft, nontender, nondistended. Bowel sounds present. No organomegaly or mass.  EXTREMITIES: No cyanosis, clubbing or edema b/l.    NEUROLOGIC: Cranial nerves II through XII are intact. No focal Motor or sensory deficits b/l.  nystagmus + PSYCHIATRIC: The patient is alert and oriented x 3.  SKIN: No obvious rash, lesion, or ulcer.    LABORATORY PANEL:   CBC  Recent Labs Lab 01/30/15 1133  WBC 8.2  HGB 13.2  HCT 41.0  PLT 186    Chemistries   Recent Labs Lab 01/30/15 1133  NA 142  K 3.8  CL 103  CO2 27  GLUCOSE 149*  BUN 11  CREATININE 0.90  CALCIUM 9.2    Cardiac Enzymes No results for input(s): TROPONINI in the last 168 hours.  RADIOLOGY:  Ct Head Wo Contrast  01/30/2015  CLINICAL DATA:  Dizziness which began after the patient woke up this morning. No known injury. Initial encounter. EXAM: CT HEAD WITHOUT CONTRAST TECHNIQUE: Contiguous axial images were obtained from the base of the skull through the vertex without intravenous contrast. COMPARISON:  None. FINDINGS: There is no evidence of acute intracranial abnormality including hemorrhage, infarct, mass lesion, mass effect and abnormal extra-axial fluid collection. No hydrocephalus or pneumocephalus. The calvarium is intact. Imaged paranasal sinuses and mastoid air cells are clear. IMPRESSION: Negative head CT. Electronically Signed   By: Inge Rise M.D.   On: 01/30/2015 12:51     ASSESSMENT AND PLAN:  Oza Ramnath is a 64 y.o. male with a known history of hypertension, hyperlipidemia, gastroesophageal reflux disease. He presents to the emergency room today with dizziness and room spinning starting this a.m. at 8 AM.  1.Acute onset vertigo/dizziness -D/D  Benign positional vertigo with  otitis media on the left vs central causes -cont po augmetin -asa -MRI brain unable to do due to pt size -repeat CT head in am -ENT consult as out pt. D/w pt -seen by PT. -cont meclizine and prn  valium  2. Essential hypertension continue Norvasc and benazepril  3. hyperlipidemia unspecified continue pravastatin   4. BPH and history of prostate cancer on Flomax  5. Constipation on Colace and MiraLAX  6. Obesity- weight loss needed  Case discussed with Care Management/Social Worker. Management plans discussed with the patient, family and they are in agreement.  CODE STATUS: full  DVT Prophylaxis: lovenox  TOTAL TIME TAKING CARE OF THIS PATIENT: 40 minutes.  >50% time spent on counselling and coordination of care  POSSIBLE D/C IN 1-2 DAYS, DEPENDING ON CLINICAL CONDITION.   Aries Kasa M.D on 01/31/2015 at 4:15 PM  Between 7am to 6pm - Pager - [phone]  After 6pm go to [email or web address] - password [email or web address] [facility]  [facility]  Office  [phone]  CC: Primary care physician; No primary care provider on file.

## 2015-01-31 NOTE — Progress Notes (Signed)
Per report, O2 at 2L applied due to low HR (40's). Pt stable this AM, removed O2, will continue to monitor. Pt states no dizziness/vertigo this AM, good appetite, resting in bed.

## 2015-02-01 ENCOUNTER — Observation Stay: Payer: Medicare Other

## 2015-02-01 MED ORDER — LORATADINE 10 MG PO TABS: 10.0000 mg | ORAL_TABLET | Freq: Every day | ORAL | Status: DC

## 2015-02-01 MED ORDER — AMOXICILLIN-POT CLAVULANATE 875-125 MG PO TABS: 1.0000 | ORAL_TABLET | Freq: Two times a day (BID) | ORAL | Status: DC

## 2015-02-01 MED ORDER — MECLIZINE HCL 25 MG PO TABS: 25.0000 mg | ORAL_TABLET | Freq: Two times a day (BID) | ORAL | Status: AC | PRN

## 2015-02-01 NOTE — Discharge Instructions (Signed)
Dizziness °Dizziness is a common problem. It makes you feel unsteady or lightheaded. You may feel like you are about to pass out (faint). Dizziness can lead to injury if you stumble or fall. Anyone can get dizzy, but dizziness is more common in older adults. This condition can be caused by a number of things, including: °· Medicines. °· Dehydration. °· Illness. °HOME CARE °Following these instructions may help with your condition: °Eating and Drinking °· Drink enough fluid to keep your pee (urine) clear or pale yellow. This helps to keep you from getting dehydrated. Try to drink more clear fluids, such as water. °· Do not drink alcohol. °· Limit how much caffeine you drink or eat if told by your doctor. °· Limit how much salt you drink or eat if told by your doctor. °Activity °· Avoid making quick movements. °¨ When you stand up from sitting in a chair, steady yourself until you feel okay. °¨ In the morning, first sit up on the side of the bed. When you feel okay, stand slowly while you hold onto something. Do this until you know that your balance is fine. °· Move your legs often if you need to stand in one place for a long time. Tighten and relax your muscles in your legs while you are standing. °· Do not drive or use heavy machinery if you feel dizzy. °· Avoid bending down if you feel dizzy. Place items in your home so that they are easy for you to reach without leaning over. °Lifestyle °· Do not use any tobacco products, including cigarettes, chewing tobacco, or electronic cigarettes. If you need help quitting, ask your doctor. °· Try to lower your stress level, such as with yoga or meditation. Talk with your doctor if you need help. °General Instructions °· Watch your dizziness for any changes. °· Take medicines only as told by your doctor. Talk with your doctor if you think that your dizziness is caused by a medicine that you are taking. °· Tell a friend or a family member that you are feeling dizzy. If he or  she notices any changes in your behavior, have this person call your doctor. °· Keep all follow-up visits as told by your doctor. This is important. °GET HELP IF: °· Your dizziness does not go away. °· Your dizziness or light-headedness gets worse. °· You feel sick to your stomach (nauseous). °· You have trouble hearing. °· You have new symptoms. °· You are unsteady on your feet or you feel like the room is spinning. °GET HELP RIGHT AWAY IF: °· You throw up (vomit) or have diarrhea and are unable to eat or drink anything. °· You have trouble: °¨ Talking. °¨ Walking. °¨ Swallowing. °¨ Using your arms, hands, or legs. °· You feel generally weak. °· You are not thinking clearly or you have trouble forming sentences. It may take a friend or family member to notice this. °· You have: °¨ Chest pain. °¨ Pain in your belly (abdomen). °¨ Shortness of breath. °¨ Sweating. °· Your vision changes. °· You are bleeding. °· You have a headache. °· You have neck pain or a stiff neck. °· You have a fever. °  °This information is not intended to replace advice given to you by your health care provider. Make sure you discuss any questions you have with your health care provider. °  °Document Released: 01/23/2011 Document Revised: 06/20/2014 Document Reviewed: 01/30/2014 °Elsevier Interactive Patient Education ©2016 [employer] ° °

## 2015-02-01 NOTE — Progress Notes (Signed)
MD ordered patient to be discharged home.  Discharge instructions were reviewed with the patient and wife and they voiced understanding.  Follow-up appointment was made.  IV was removed with catheter intact.  All patients questions were answered.  Patient left via wheelchair escorted by auxillary.

## 2015-02-01 NOTE — Discharge Summary (Signed)
[facility] [place] NAME: Yadrian Hibma    MR#:  [id number]  DATE OF BIRTH:  1951/01/25  DATE OF ADMISSION:  01/30/2015 ADMITTING PHYSICIAN: Loletha Grayer, MD  DATE OF DISCHARGE: 02/01/15  PRIMARY CARE PHYSICIAN: No primary care provider on file.    ADMISSION DIAGNOSIS:  Dizziness [R42]  DISCHARGE DIAGNOSIS:  Dizziness/vertigo symptoms suspected due to Acute sinusitis Morbid obesity  SECONDARY DIAGNOSIS:   Past Medical History  Diagnosis Date  . BPH (benign prostatic hyperplasia)   . Cancer [facility])     Prostate CA  . Hypertension   . Arthritis   . GERD (gastroesophageal reflux disease)   . High cholesterol     HOSPITAL COURSE:   Truen Schaul is a 64 y.o. male with a known history of hypertension, hyperlipidemia, gastroesophageal reflux disease. He presents to the emergency room today with dizziness and room spinning starting this a.m. at 8 AM.  1.Acute onset vertigo/dizziness suspected due to sinusitis.  -cont po augmetin for about 10 more days. Follow-up with ENT as outpatient. -By mouth Claritin -asa -MRI brain unable to do due to pt size -repeat CT head negative for CVA. Positive for acute sphenoidal sinusitis -ENT consult as out pt. D/w pt -seen by PT. no PT recommendations -cont meclizine prn   2. Essential hypertension continue Norvasc and benazepril  3. hyperlipidemia unspecified continue pravastatin   4. BPH and history of prostate cancer on Flomax  5. Constipation on Colace and MiraLAX  6. Obesity- weight loss needed  Discussed at length with patient and patient's wife. Agreeable with above plan. Discharge patient to home. CONSULTS OBTAINED:  Treatment Team:  Clyde Canterbury, MD  DRUG ALLERGIES:  No Known Allergies  DISCHARGE MEDICATIONS:   Current Discharge Medication List    START taking these medications   Details  amoxicillin-clavulanate (AUGMENTIN) 875-125 MG tablet Take 1 tablet by mouth  every 12 (twelve) hours. Qty: 20 tablet, Refills: 0    loratadine (CLARITIN) 10 MG tablet Take 1 tablet (10 mg total) by mouth daily. Qty: 30 tablet, Refills: 0    meclizine (ANTIVERT) 25 MG tablet Take 1 tablet (25 mg total) by mouth 2 (two) times daily as needed for dizziness. Qty: 30 tablet, Refills: 0      CONTINUE these medications which have NOT CHANGED   Details  acetaminophen (TYLENOL) 325 MG tablet Take 650 mg by mouth every 6 (six) hours as needed for mild pain or headache.    amLODipine (NORVASC) 10 MG tablet Take 10 mg by mouth daily.    aspirin EC 81 MG tablet Take 81 mg by mouth daily.    benazepril-hydrochlorthiazide (LOTENSIN HCT) 20-25 MG tablet Take 1 tablet by mouth daily.    docusate sodium (COLACE) 100 MG capsule Take 200 mg by mouth daily.    meloxicam (MOBIC) 15 MG tablet Take 15 mg by mouth daily.    omeprazole (PRILOSEC) 20 MG capsule Take 20 mg by mouth daily.    pravastatin (PRAVACHOL) 80 MG tablet Take 80 mg by mouth at bedtime.    tamsulosin (FLOMAX) 0.4 MG CAPS capsule Take 0.4 mg by mouth daily.        If you experience worsening of your admission symptoms, develop shortness of breath, life threatening emergency, suicidal or homicidal thoughts you must seek medical attention immediately by calling 911 or calling your MD immediately  if symptoms less severe.  You Must read complete instructions/literature along with all the possible adverse reactions/side  effects for all the Medicines you take and that have been prescribed to you. Take any new Medicines after you have completely understood and accept all the possible adverse reactions/side effects.   Please note  You were cared for by a hospitalist during your hospital stay. If you have any questions about your discharge medications or the care you received while you were in the hospital after you are discharged, you can call the unit and asked to speak with the hospitalist on call if the  hospitalist that took care of you is not available. Once you are discharged, your primary care physician will handle any further medical issues. Please note that NO REFILLS for any discharge medications will be authorized once you are discharged, as it is imperative that you return to your primary care physician (or establish a relationship with a primary care physician if you do not have one) for your aftercare needs so that they can reassess your need for medications and monitor your lab values. Today   SUBJECTIVE  Feels better today.  VITAL SIGNS:  Blood pressure 147/82, pulse 67, temperature 98.7 F (37.1 C), temperature source Oral, resp. rate 18, height 5\' 9"  (1.753 m), weight 350 lb (158.759 kg), SpO2 99 %.  I/O:   Intake/Output Summary (Last 24 hours) at 02/01/15 1404 Last data filed at 02/01/15 1336  Gross per 24 hour  Intake    600 ml  Output   1000 ml  Net   -400 ml    PHYSICAL EXAMINATION:  GENERAL:  64 y.o.-year-old patient lying in the bed with no acute distress. obese EYES: Pupils equal, round, reactive to light and accommodation. No scleral icterus. Extraocular muscles intact.  HEENT: Head atraumatic, normocephalic. Oropharynx and nasopharynx clear.  NECK:  Supple, no jugular venous distention. No thyroid enlargement, no tenderness.  LUNGS: Normal breath sounds bilaterally, no wheezing, rales,rhonchi or crepitation. No use of accessory muscles of respiration.  CARDIOVASCULAR: S1, S2 normal. No murmurs, rubs, or gallops.  ABDOMEN: Soft, non-tender, non-distended. Bowel sounds present. No organomegaly or mass.  EXTREMITIES:++ pedal edema -no cyanosis, or clubbing.  NEUROLOGIC: Cranial nerves II through XII are intact. Muscle strength 5/5 in all extremities. Sensation intact. Gait not checked.  PSYCHIATRIC: The patient is alert and oriented x 3.  SKIN: No obvious rash, lesion, or ulcer.   DATA REVIEW:   CBC   Recent Labs Lab 01/30/15 1133  WBC 8.2  HGB 13.2   HCT 41.0  PLT 186    Chemistries   Recent Labs Lab 01/30/15 1133  NA 142  K 3.8  CL 103  CO2 27  GLUCOSE 149*  BUN 11  CREATININE 0.90  CALCIUM 9.2    Microbiology Results   No results found for this or any previous visit (from the past 240 hour(s)).  RADIOLOGY:  Ct Head Wo Contrast  02/01/2015  CLINICAL DATA:  Dizziness which began earlier today at 8 a.m. Unable to walk. EXAM: CT HEAD WITHOUT CONTRAST TECHNIQUE: Contiguous axial images were obtained from the base of the skull through the vertex without intravenous contrast. COMPARISON:  01/30/2015 CT head, also at [facility]. FINDINGS: No evidence for acute infarction, hemorrhage, mass lesion, hydrocephalus, or extra-axial fluid. Normal for age cerebral volume. Minor hypoattenuation of white matter, likely chronic microvascular ischemic change. Minor vascular calcifications distal RIGHT vertebral, but no CT signs of large vessel occlusion. Calvarium intact. Negative acute findings in the orbits. LEFT cataract extraction. No middle ear or mastoid fluid. Incompletely visualized paranasal sinuses demonstrate  moderate fluid accumulation in the RIGHT division of the sphenoid there appears to be some slight osseous reaction, suggesting chronicity. Increase layering fluid in the same RIGHT division from the study 3 days ago suggests superimposed acute component. IMPRESSION: No acute intracranial findings. Overall stable intracranial appearance from priors. Chronic and acute sphenoid sinusitis. Interval worsening from 01/30/2015. Electronically Signed   By: Staci Righter M.D.   On: 02/01/2015 09:11     Management plans discussed with the patient, family and they are in agreement.  CODE STATUS:     Code Status Orders        Start     Ordered   01/30/15 1514  Full code   Continuous     01/30/15 1515    Advance Directive Documentation        Most Recent Value   Type of Advance Directive  Healthcare Power of Attorney, Living will    Pre-existing out of facility DNR order (yellow form or pink MOST form)     "MOST" Form in Place?        TOTAL TIME TAKING CARE OF THIS PATIENT: *40 minutes.    Hendryx Ricke M.D on 02/01/2015 at 2:04 PM  Between 7am to 6pm - Pager - [phone] After 6pm go to [email or web address] - password [email or web address] [facility]  [place] [facility]  Office  [phone]  CC: Primary care physician; No primary care provider on file.

## 2015-02-01 NOTE — Progress Notes (Signed)
MD gave orders to discontinue telemetry

## 2015-02-01 NOTE — Progress Notes (Signed)
Physical Therapy Treatment Patient Details Name: CORNELIUS ZEITZ Sr. MRN: [id number] DOB: 07-Oct-1950 Today's Date: 02/01/2015    History of Present Illness Mena Michelotti is a 64 y.o. male with a known history of hypertension, hyperlipidemia, gastroesophageal reflux disease. He presented to the emergency room yesterday with dizziness and vertigo. The patient is also having some neck pain and left shoulder pain going on for 3 weeks. Pt was unable to lift his head up off the pillow in the ER. ER physician indicates nystagmus on exam but does not clarify direction or quality of nystagmus. Hospitalist reports otitis media in L ear and admitted pt under observation for BPPV. Pt reports that yesterday morning he was laying still in bed when all of a sudden he started to see the room spin. Pt denies rolling or turning in bed. Symptoms lasted for 1-2 seconds initially and then the symptoms returned a second time and it lasted a couple seconds. Pt initially thought it was his blood pressure. He took his BP meds and sat in the living room. Pt went to the restroom and had a bowel movement. When he stood up from the commode he started having vertigo again. Pt reports that occasionally he has trouble focusing and he feels like his visual field is shifting. Pt reports history of L neck and shoulder pain over the last month. Pt describes his symptoms as vertigo, lightheadedness, unsteadiness. Denies syncope. He had multiple episodes yesterday which lasted seconds in duration. Pt reports stress test a couple months ago which was normal. Symptoms are motion provoked and occur with head changes and position changes. Easing factors are resting and closing his eyes. Pt states that his symptoms are improved today. He has no prior history of similar episodes. No reported falls in the last 12 months. Pt was previously independent with ADLs/IADLs without assistive device. Pt denies tinnitus, pain, or ear drainage. No diplopia or recent  changes in vision. Pt denies nausea/vomiting, dysarthria, dysphagia, drop attacks, bowel and bladder changes, recent weight loss, headache, migraines,    PT Comments    Pt is asymptomatic upon arrival. No resting or gaze evoked nystagmus observed. Pt able to perform transfers and full ambulation around RN station without reported vertigo or instability. Gait speed is [facility] for full household ambulation. Upon return to room pt placed in L sidelying position in bed. He starts to have vigorous ageotrophic horizontal nystagmus which starts immediately and persists for >5 minutes without evidence of fatigability. Pt complains of severe vertigo during episode. Pt returned to sitting position and nystagmus diminishes significantly but persists with R beating horizontal. Faint torsional component observed with nystagmus and confirmed by second therapist. Pt placed in R sidelying and nystagmus starts again but reverses to ageotrophic L horizontal nystagmus immediately and persists without ceasing until pt returned to sitting. Patient's symptoms are concerning for central causes and very unlikely related to vestibular disorders given presentation and history. Did not discuss with family/patient but discussed concerns with MD earlier in the day. Family is upset and concerned that pt has more severe issue than simple vertigo. Advised pt to discuss concerns with nursing staff and hospitalist. Offered to have nursing director speak with family and they reported that they would like to speak with her. Nursing director notified. Pt is safe to return home. Advised pt to use rolling walker and lower himself to ground if he has symptoms while ambulating at home. Pt would benefit from OP PT for balance training to reduce fall risk and  work on [employer] operational officer. He should follow-up with neurology/ENT as OP as appropriate per MD referral. Should be educated about signs and symptoms of stroke.      Follow Up Recommendations   Outpatient PT (OP Vestibular PT if central causes ruled out)     Equipment Recommendations  None recommended by PT    Recommendations for Other Services       Precautions / Restrictions Precautions Precautions: Fall Restrictions Weight Bearing Restrictions: No    Mobility  Bed Mobility Overal bed mobility: Independent             General bed mobility comments: No difficulty going from sitting to sidelying  Transfers Overall transfer level: Needs assistance Equipment used: Rolling walker (2 wheeled) Transfers: Sit to/from Stand Sit to Stand: Min guard         General transfer comment: Pt able to come to standing with CGA only. No reported vertigo during sit to stand transfers on this date  Ambulation/Gait Ambulation/Gait assistance: Min guard Ambulation Distance (Feet): 220 Feet Assistive device: Rolling walker (2 wheeled) (bariatric) Gait Pattern/deviations: Step-through pattern   Gait velocity interpretation: at or above normal speed for age/gender General Gait Details: Gait speed WFL. No instability noted. Minimal head turns by patient. +2 present for chair follow. No reported vertigo during ambulation. No reported fatigue or lightheadedness during gait   Stairs            Wheelchair Mobility    Modified Rankin (Stroke Patients Only)       Balance Overall balance assessment: Needs assistance   Sitting balance-Leahy Scale: Good       Standing balance-Leahy Scale: Fair                      Cognition Arousal/Alertness: Awake/alert Behavior During Therapy: WFL for tasks assessed/performed Overall Cognitive Status: Within Functional Limits for tasks assessed                      Exercises      General Comments        Pertinent Vitals/Pain Pain Assessment: No/denies pain    Home Living                      Prior Function            PT Goals (current goals can now be found in the care plan section) Acute  Rehab PT Goals Patient Stated Goal: Decrease vertigo PT Goal Formulation: With patient Time For Goal Achievement: 02/14/15 Potential to Achieve Goals: Fair Progress towards PT goals: Progressing toward goals    Frequency  Min 2X/week    PT Plan Discharge plan needs to be updated    Co-evaluation             End of Session Equipment Utilized During Treatment: Gait belt Activity Tolerance: Patient tolerated treatment well Patient left: in chair;with call bell/phone within reach;with family/visitor present     Time: 1135-1205 PT Time Calculation (min) (ACUTE ONLY): 30 min  Charges:  $Therapeutic Activity: 8-22 mins                    G Codes:      Lyndel Safe Daivon Rayos PT, DPT   Shanekia Latella 02/01/2015, 12:39 PM

## 2015-02-28 ENCOUNTER — Ambulatory Visit: Payer: Medicare Other | Attending: Internal Medicine

## 2015-02-28 DIAGNOSIS — G4733 Obstructive sleep apnea (adult) (pediatric): Secondary | ICD-10-CM | POA: Insufficient documentation

## 2015-09-29 ENCOUNTER — Ambulatory Visit
Admission: EM | Admit: 2015-09-29 | Discharge: 2015-09-29 | Disposition: A | Discharge status: Home / Self Care | Payer: Medicare Other

## 2015-09-29 DIAGNOSIS — A084 Viral intestinal infection, unspecified: Secondary | ICD-10-CM

## 2015-09-29 MED ORDER — LOPERAMIDE HCL 2 MG PO CAPS: 2.0000 mg | ORAL_CAPSULE | ORAL | 0 refills | Status: AC | PRN

## 2015-09-29 NOTE — ED Triage Notes (Signed)
Pt reports eating chicken Wednesday night and watermelon on Thursday morning. Started Thursday with stomach pain and mucousy diarrhea. No blood. Episodes 2-3 x per day. No vomiting.

## 2015-09-29 NOTE — ED Provider Notes (Signed)
CSN: [id number]     Arrival date & time 09/29/15  [id number] History   First MD Initiated Contact with Patient 09/29/15 1005     Chief Complaint  Patient presents with  . Diarrhea   (Consider location/radiation/quality/duration/timing/severity/associated sxs/prior Treatment) Colin Mccoy is a well-appearing 65 y.o male with history of prostate cancer, hypertension, GERD, hyperlipidemia, presents today for diarrhea x 3 days. He also endorses generalized abdominal pain 5/10, and feeling woozy but no dizziness. Patient is unsure if he ate something bad. He ate some chicken a day prior to the onset of his diarrhea. He states that the chicken have been left in the fridge for 3 days. He also ate some watermelon the day of the onset of symptoms. He have had 4 episodes of diarrhea today. His diarrhea is watery and explosive. He feels the urge to use the restroom every time when has the abdominal pain and the pain will resolved after each bowel movement. He denies nausea or vomiting. He is able to tolerate fluid; been drinking water at home as well.       Past Medical History:  Diagnosis Date  . Arthritis   . BPH (benign prostatic hyperplasia)   . Cancer [facility])    Prostate CA  . Cancer ([place])   . GERD (gastroesophageal reflux disease)   . High cholesterol   . Hypertension    Past Surgical History:  Procedure Laterality Date  . Partial kidney resection    . Prostate seed implantation     Family History  Problem Relation Age of Onset  . Colon cancer Mother   . Lung cancer Father   . Benign prostatic hyperplasia Father    Social History  Substance Use Topics  . Smoking status: Never Smoker  . Smokeless tobacco: Never Used  . Alcohol use Yes     Comment: social    Review of Systems  Constitutional: Positive for appetite change, chills, fatigue and fever.  Respiratory: Negative for cough and shortness of breath.   Cardiovascular: Negative for chest pain, palpitations and leg swelling.   Gastrointestinal: Positive for abdominal pain and diarrhea. Negative for nausea and vomiting.  Neurological: Negative for dizziness ( ), weakness and headaches.       Feels woozy at times    Allergies  Review of patient's allergies indicates no known allergies.  Home Medications   Prior to Admission medications   Medication Sig Start Date End Date Taking? Authorizing Provider  acetaminophen (TYLENOL) 325 MG tablet Take 650 mg by mouth every 6 (six) hours as needed for mild pain or headache.   Yes Historical Provider, MD  amLODipine (NORVASC) 10 MG tablet Take 10 mg by mouth daily.   Yes Historical Provider, MD  aspirin EC 81 MG tablet Take 81 mg by mouth daily.   Yes Historical Provider, MD  docusate sodium (COLACE) 100 MG capsule Take 200 mg by mouth daily.   Yes Historical Provider, MD  furosemide (LASIX) 20 MG tablet Take 20 mg by mouth.   Yes Historical Provider, MD  losartan (COZAAR) 100 MG tablet Take 100 mg by mouth daily.   Yes Historical Provider, MD  meloxicam (MOBIC) 15 MG tablet Take 15 mg by mouth daily.   Yes Historical Provider, MD  omeprazole (PRILOSEC) 20 MG capsule Take 20 mg by mouth daily.   Yes Historical Provider, MD  pravastatin (PRAVACHOL) 80 MG tablet Take 80 mg by mouth at bedtime.   Yes Historical Provider, MD  tamsulosin (FLOMAX) 0.4 MG CAPS  capsule Take 0.4 mg by mouth daily.   Yes Historical Provider, MD  benazepril-hydrochlorthiazide (LOTENSIN HCT) 20-25 MG tablet Take 1 tablet by mouth daily.    Historical Provider, MD  loperamide (IMODIUM) 2 MG capsule Take 1 capsule (2 mg total) by mouth as needed for diarrhea or loose stools (4 mg orally, followed by 2 mg after each episode of diarrhea, up to 8 mg/day). 09/29/15 10/01/15  Barry Dienes, NP  loratadine (CLARITIN) 10 MG tablet Take 1 tablet (10 mg total) by mouth daily. 02/01/15   Fritzi Mandes, MD  meclizine (ANTIVERT) 25 MG tablet Take 1 tablet (25 mg total) by mouth 2 (two) times daily as needed for dizziness.  02/01/15   Fritzi Mandes, MD   Meds Ordered and Administered this Visit  Medications - No data to display  BP 123/64 (BP Location: Left Arm)   Pulse 69   Temp 99.8 F (37.7 C) (Oral)   Resp 20   SpO2 99%  No data found.   Physical Exam  Constitutional: He is oriented to person, place, and time. He appears well-developed and well-nourished.  HENT:  Head: Normocephalic and atraumatic.  Oral mucosa slightly dry  Neck: Normal range of motion. Neck supple.  Cardiovascular: Normal rate and regular rhythm.   Murmur heard. Strong and intact distal pulses. Grade II/VI systolic murmur present most prominent at the pulmonic area; have had ECHO with PCP  Pulmonary/Chest: Effort normal and breath sounds normal. He has no wheezes.  Abdominal: Soft. Bowel sounds are normal.  No tenderness on palpation, no mass  Lymphadenopathy:    He has no cervical adenopathy.  Neurological: He is alert and oriented to person, place, and time. Coordination normal.  Skin: Skin is warm and dry.  Good tugor    Urgent Care Course   Clinical Course    Procedures (including critical care time)  Labs Review Labs Reviewed - No data to display  Imaging Review No results found.    MDM   1. Viral gastroenteritis    Most likely from the chicken he ate the day prior to the onset of diarrhea. Patient able to tolerate oral fluid without any difficulty. His BP and HR was normal. IV fluid not indicated at the moment. Education provided at on the diagnosis. Patient discharged home in good condition; instructed to do drink plenty of water. Instructed to take tylenol or ibuprofen for fever. Also instructed to follow up with PCP if his diarrhea does not improve. All questions were answered.    Barry Dienes, NP 09/29/15 1036

## 2016-01-23 DIAGNOSIS — R600 Localized edema: Secondary | ICD-10-CM | POA: Insufficient documentation

## 2016-01-23 DIAGNOSIS — R0602 Shortness of breath: Secondary | ICD-10-CM | POA: Insufficient documentation

## 2016-09-08 ENCOUNTER — Encounter: Payer: Self-pay | Admitting: *Deleted

## 2016-09-09 ENCOUNTER — Ambulatory Visit: Payer: Medicare Other | Admitting: Anesthesiology

## 2016-09-09 ENCOUNTER — Ambulatory Visit
Admission: RE | Admit: 2016-09-09 | Discharge: 2016-09-09 | Disposition: A | Discharge status: Home / Self Care | Payer: Medicare Other | Source: Ambulatory Visit | Attending: Gastroenterology | Admitting: Gastroenterology

## 2016-09-09 ENCOUNTER — Encounter: Payer: Self-pay | Admitting: *Deleted

## 2016-09-09 ENCOUNTER — Encounter
Admission: RE | Disposition: A | Discharge status: Home / Self Care | Payer: Self-pay | Source: Ambulatory Visit | Attending: Gastroenterology

## 2016-09-09 DIAGNOSIS — Z7982 Long term (current) use of aspirin: Secondary | ICD-10-CM | POA: Insufficient documentation

## 2016-09-09 DIAGNOSIS — Z79899 Other long term (current) drug therapy: Secondary | ICD-10-CM | POA: Diagnosis not present

## 2016-09-09 DIAGNOSIS — K219 Gastro-esophageal reflux disease without esophagitis: Secondary | ICD-10-CM | POA: Diagnosis not present

## 2016-09-09 DIAGNOSIS — Z8546 Personal history of malignant neoplasm of prostate: Secondary | ICD-10-CM | POA: Insufficient documentation

## 2016-09-09 DIAGNOSIS — E7800 Pure hypercholesterolemia, unspecified: Secondary | ICD-10-CM | POA: Diagnosis not present

## 2016-09-09 DIAGNOSIS — N400 Enlarged prostate without lower urinary tract symptoms: Secondary | ICD-10-CM | POA: Diagnosis not present

## 2016-09-09 DIAGNOSIS — Z1211 Encounter for screening for malignant neoplasm of colon: Secondary | ICD-10-CM | POA: Diagnosis present

## 2016-09-09 DIAGNOSIS — K5730 Diverticulosis of large intestine without perforation or abscess without bleeding: Secondary | ICD-10-CM | POA: Insufficient documentation

## 2016-09-09 DIAGNOSIS — D123 Benign neoplasm of transverse colon: Secondary | ICD-10-CM | POA: Insufficient documentation

## 2016-09-09 DIAGNOSIS — E119 Type 2 diabetes mellitus without complications: Secondary | ICD-10-CM | POA: Diagnosis not present

## 2016-09-09 DIAGNOSIS — D120 Benign neoplasm of cecum: Secondary | ICD-10-CM | POA: Insufficient documentation

## 2016-09-09 DIAGNOSIS — K5520 Angiodysplasia of colon without hemorrhage: Secondary | ICD-10-CM | POA: Insufficient documentation

## 2016-09-09 DIAGNOSIS — Z800 Family history of malignant neoplasm of digestive organs: Secondary | ICD-10-CM | POA: Insufficient documentation

## 2016-09-09 DIAGNOSIS — I10 Essential (primary) hypertension: Secondary | ICD-10-CM | POA: Diagnosis not present

## 2016-09-09 DIAGNOSIS — Z6841 Body Mass Index (BMI) 40.0 and over, adult: Secondary | ICD-10-CM | POA: Insufficient documentation

## 2016-09-09 DIAGNOSIS — M1990 Unspecified osteoarthritis, unspecified site: Secondary | ICD-10-CM | POA: Diagnosis not present

## 2016-09-09 DIAGNOSIS — E559 Vitamin D deficiency, unspecified: Secondary | ICD-10-CM | POA: Insufficient documentation

## 2016-09-09 HISTORY — DX: Type 2 diabetes mellitus without complications: E11.9

## 2016-09-09 HISTORY — PX: COLONOSCOPY WITH PROPOFOL: SHX5780

## 2016-09-09 HISTORY — DX: Vitamin D deficiency, unspecified: E55.9

## 2016-09-09 LAB — GLUCOSE, CAPILLARY: Glucose-Capillary: 98 mg/dL (ref 65–99)

## 2016-09-09 SURGERY — COLONOSCOPY WITH PROPOFOL
Anesthesia: General

## 2016-09-09 MED ORDER — MIDAZOLAM HCL 2 MG/2ML IJ SOLN
INTRAMUSCULAR | Status: AC
  Filled 2016-09-09: qty 2

## 2016-09-09 MED ORDER — SODIUM CHLORIDE 0.9 % IV SOLN
INTRAVENOUS | Status: DC
  Administered 2016-09-09: 14:00:00 via INTRAVENOUS

## 2016-09-09 MED ORDER — PROPOFOL 500 MG/50ML IV EMUL
INTRAVENOUS | Status: AC
  Filled 2016-09-09: qty 50

## 2016-09-09 MED ORDER — PROPOFOL 10 MG/ML IV BOLUS
INTRAVENOUS | Status: DC | PRN
  Administered 2016-09-09: 20 mg via INTRAVENOUS

## 2016-09-09 MED ORDER — LIDOCAINE HCL (CARDIAC) 20 MG/ML IV SOLN
INTRAVENOUS | Status: DC | PRN
  Administered 2016-09-09: 30 mg via INTRAVENOUS

## 2016-09-09 MED ORDER — MIDAZOLAM HCL 2 MG/2ML IJ SOLN
INTRAMUSCULAR | Status: DC | PRN
  Administered 2016-09-09: 1 mg via INTRAVENOUS

## 2016-09-09 MED ORDER — SODIUM CHLORIDE 0.9 % IV SOLN: INTRAVENOUS | Status: DC

## 2016-09-09 MED ORDER — PROPOFOL 500 MG/50ML IV EMUL
INTRAVENOUS | Status: DC | PRN
  Administered 2016-09-09: 140 ug/kg/min via INTRAVENOUS

## 2016-09-09 MED ORDER — GLYCOPYRROLATE 0.2 MG/ML IJ SOLN
INTRAMUSCULAR | Status: DC | PRN
  Administered 2016-09-09: 0.2 mg via INTRAVENOUS

## 2016-09-09 NOTE — H&P (Signed)
Outpatient short stay form Pre-procedure 09/09/2016 3:29 PM Lollie Sails MD  Primary Physician: [facility]  Reason for visit:  Colonoscopy  History of present illness:  Patient is a 66 year old male presenting today as above. He has a family history of colon cancer primary relative. He tolerated his procedure well. He takes a daily 81 mg aspirin that is been held for several days. He takes no other aspirin products or blood thinning agents.    Current Facility-Administered Medications:  .  0.9 %  sodium chloride infusion, , Intravenous, Continuous, Lollie Sails, MD, Last Rate: 20 mL/hr at 09/09/16 1358 .  0.9 %  sodium chloride infusion, , Intravenous, Continuous, Lollie Sails, MD  Prescriptions Prior to Admission  Medication Sig Dispense Refill Last Dose  . acetaminophen (TYLENOL) 325 MG tablet Take 650 mg by mouth every 6 (six) hours as needed for mild pain or headache.   Past Week at Unknown time  . amLODipine (NORVASC) 10 MG tablet Take 10 mg by mouth daily.   09/08/2016 at Unknown time  . aspirin EC 81 MG tablet Take 81 mg by mouth daily.   09/08/2016 at Unknown time  . docusate sodium (COLACE) 100 MG capsule Take 200 mg by mouth daily.   Past Week at Unknown time  . fluticasone (FLONASE) 50 MCG/ACT nasal spray Place 2 sprays into both nostrils daily.   Past Month at Unknown time  . loratadine (CLARITIN) 10 MG tablet Take 1 tablet (10 mg total) by mouth daily. 30 tablet 0 Past Month at Unknown time  . losartan (COZAAR) 100 MG tablet Take 100 mg by mouth daily.   09/08/2016 at Unknown time  . meclizine (ANTIVERT) 25 MG tablet Take 1 tablet (25 mg total) by mouth 2 (two) times daily as needed for dizziness. 30 tablet 0 Past Week at Unknown time  . meloxicam (MOBIC) 15 MG tablet Take 15 mg by mouth daily.   09/08/2016 at Unknown time  . nitroGLYCERIN (NITROSTAT) 0.4 MG SL tablet Place 0.4 mg under the tongue every 5 (five) minutes as needed for chest pain.      Marland Kitchen omeprazole (PRILOSEC) 20 MG capsule Take 20 mg by mouth daily.   09/08/2016 at Unknown time  . pravastatin (PRAVACHOL) 80 MG tablet Take 80 mg by mouth at bedtime.   Past Week at Unknown time  . tamsulosin (FLOMAX) 0.4 MG CAPS capsule Take 0.4 mg by mouth daily.   09/08/2016 at Unknown time  . traMADol (ULTRAM) 50 MG tablet Take by mouth every 6 (six) hours as needed.   Past Week at Unknown time  . benazepril-hydrochlorthiazide (LOTENSIN HCT) 20-25 MG tablet Take 1 tablet by mouth daily.   Not Taking at Unknown time  . furosemide (LASIX) 20 MG tablet Take 20 mg by mouth.        No Known Allergies   Past Medical History:  Diagnosis Date  . Arthritis   . BPH (benign prostatic hyperplasia)   . Cancer [facility])    Prostate CA  . Cancer ([place])   . Diabetes mellitus without complication ([place])   . GERD (gastroesophageal reflux disease)   . High cholesterol   . Hypertension   . Vitamin D deficiency     Review of systems:      Physical Exam    Heart and lungs: Regular rate and rhythm without rub or gallop, lungs are bilaterally clear.    HEENT: Normocephalic atraumatic eyes are anicteric    Other:  Pertinant exam for procedure: Soft nontender, obese, not possible palpate internal organs. Bowel sounds positive normoactive.    Planned proceedures: Colonoscopy and indicated procedures. I have discussed the risks benefits and complications of procedures to include not limited to bleeding, infection, perforation and the risk of sedation and the patient wishes to proceed.    Lollie Sails, MD Gastroenterology 09/09/2016  3:29 PM

## 2016-09-09 NOTE — Anesthesia Post-op Follow-up Note (Cosign Needed)
Anesthesia QCDR form completed.        

## 2016-09-09 NOTE — Transfer of Care (Signed)
Immediate Anesthesia Transfer of Care Note  Patient: Colin Mccoy.  Procedure(s) Performed: Procedure(s): COLONOSCOPY WITH PROPOFOL (N/A)  Patient Location: PACU  Anesthesia Type:General  Level of Consciousness: awake  Airway & Oxygen Therapy: Patient Spontanous Breathing and Patient connected to nasal cannula oxygen  Post-op Assessment: Report given to RN and Post -op Vital signs reviewed and stable  Post vital signs: Reviewed and stable  Last Vitals:  Vitals:   09/09/16 1329 09/09/16 1615  BP: 129/77 129/69  Pulse: (!) 55 (!) 59  Resp: 18 (!) 25  Temp: (!) 35.8 C (!) 36.1 C    Last Pain:  Vitals:   09/09/16 1615  TempSrc: Temporal  PainSc:          Complications: No apparent anesthesia complications

## 2016-09-09 NOTE — Op Note (Signed)
[facility] Gastroenterology Patient Name: Colin Mccoy Procedure Date: 09/09/2016 3:30 PM MRN: [id number] Account #: [id number] Date of Birth: Aug 10, 1950 Admit Type: Outpatient Age: 66 Room: [facility] ENDO ROOM 3 Gender: Male Note Status: Finalized Procedure:            Colonoscopy Indications:          Family history of colon cancer in a first-degree                        relative Providers:            Lollie Sails, MD Referring MD:         No Local Md, MD (Referring MD) Medicines:            Monitored Anesthesia Care Complications:        No immediate complications. Procedure:            Pre-Anesthesia Assessment:                       - ASA Grade Assessment: III - A patient with severe                        systemic disease.                       After obtaining informed consent, the colonoscope was                        passed under direct vision. Throughout the procedure,                        the patient's blood pressure, pulse, and oxygen                        saturations were monitored continuously. The                        Colonoscope was introduced through the anus and                        advanced to the the cecum, identified by appendiceal                        orifice and ileocecal valve. The colonoscopy was                        performed with moderate difficulty. Successful                        completion of the procedure was aided by using manual                        pressure. Findings:      Multiple medium-mouthed diverticula were found in the sigmoid colon and       distal descending colon.      A 3 mm polyp was found in the transverse colon. The polyp was sessile.       The polyp was removed with a cold biopsy forceps. Resection and       retrieval were complete.      A 1 mm polyp was found in the cecum. The polyp  was sessile. The polyp       was removed with a cold biopsy forceps. Resection and retrieval were       complete.      The retroflexed view of the distal rectum and anal verge was normal and       showed no anal or rectal abnormalities.      The digital rectal exam was normal.      Multiple small patchy angioectasias without bleeding were found in the       rectum and in the distal rectum. Impression:           - Diverticulosis in the sigmoid colon and in the distal                        descending colon.                       - One 3 mm polyp in the transverse colon, removed with                        a cold biopsy forceps. Resected and retrieved.                       - One 1 mm polyp in the cecum, removed with a cold                        biopsy forceps. Resected and retrieved.                       - The distal rectum and anal verge are normal on                        retroflexion view. Recommendation:       - Discharge patient to home.                       - Await pathology results.                       - Telephone GI clinic for pathology results in 1 week. Procedure Code(s):    --- Professional ---                       [phone], Colonoscopy, flexible; with biopsy, single or                        multiple Diagnosis Code(s):    --- Professional ---                       D12.3, Benign neoplasm of transverse colon (hepatic                        flexure or splenic flexure)                       D12.0, Benign neoplasm of cecum                       Z80.0, Family history of malignant neoplasm of                        digestive organs  K57.30, Diverticulosis of large intestine without                        perforation or abscess without bleeding CPT copyright 2016 [employer]. All rights reserved. The codes documented in this report are preliminary and upon coder review may  be revised to meet current compliance requirements. Lollie Sails, MD 09/09/2016 4:13:52 PM This report has been signed electronically. Number of Addenda: 0 Note Initiated On:  09/09/2016 3:30 PM Scope Withdrawal Time: 0 hours 11 minutes 19 seconds  Total Procedure Duration: 0 hours 24 minutes 0 seconds       [facility]

## 2016-09-09 NOTE — Anesthesia Procedure Notes (Signed)
Date/Time: 09/09/2016 3:34 PM Performed by: Johnna Acosta Pre-anesthesia Checklist: Patient identified, Emergency Drugs available, Suction available, Patient being monitored and Timeout performed Patient Re-evaluated:Patient Re-evaluated prior to induction Oxygen Delivery Method: Nasal cannula

## 2016-09-09 NOTE — Anesthesia Preprocedure Evaluation (Addendum)
Anesthesia Evaluation  Patient identified by MRN, date of birth, ID band Patient awake    Reviewed: Allergy & Precautions, H&P , NPO status , Patient's Chart, lab work & pertinent test results, reviewed documented beta blocker date and time   History of Anesthesia Complications Negative for: history of anesthetic complications  Airway Mallampati: IV  TM Distance: >3 FB Neck ROM: full    Dental  (+) Partial Upper, Dental Advidsory Given, Missing   Pulmonary neg pulmonary ROS,           Cardiovascular Exercise Tolerance: Good hypertension, (-) angina(-) CAD, (-) Past MI, (-) Cardiac Stents and (-) CABG negative cardio ROS  (-) dysrhythmias (-) Valvular Problems/Murmurs     Neuro/Psych negative neurological ROS  negative psych ROS   GI/Hepatic negative GI ROS, Neg liver ROS, GERD  ,  Endo/Other  negative endocrine ROSdiabetes (borderline)Morbid obesity  Renal/GU negative Renal ROS  negative genitourinary   Musculoskeletal negative musculoskeletal ROS (+)   Abdominal   Peds negative pediatric ROS (+)  Hematology negative hematology ROS (+)   Anesthesia Other Findings Past Medical History: No date: Arthritis No date: BPH (benign prostatic hyperplasia) No date: Cancer ([facility])     Comment:  Prostate CA No date: Cancer ([facility]) No date: Diabetes mellitus without complication ([facility]) No date: GERD (gastroesophageal reflux disease) No date: High cholesterol No date: Hypertension No date: Vitamin D deficiency   Reproductive/Obstetrics negative OB ROS                            Anesthesia Physical Anesthesia Plan  ASA: III  Anesthesia Plan: General   Post-op Pain Management:    Induction: Intravenous  PONV Risk Score and Plan: 2 and Propofol  Airway Management Planned: Natural Airway and Nasal Cannula  Additional Equipment:   Intra-op Plan:   Post-operative Plan:   Informed Consent:  I have reviewed the patients History and Physical, chart, labs and discussed the procedure including the risks, benefits and alternatives for the proposed anesthesia with the patient or authorized representative who has indicated his/her understanding and acceptance.   Dental Advisory Given  Plan Discussed with: Anesthesiologist, CRNA and Surgeon  Anesthesia Plan Comments:         Anesthesia Quick Evaluation

## 2016-09-10 ENCOUNTER — Encounter: Payer: Self-pay | Admitting: Gastroenterology

## 2016-09-11 LAB — SURGICAL PATHOLOGY

## 2016-09-16 NOTE — Anesthesia Postprocedure Evaluation (Signed)
Anesthesia Post Note  Patient: Colin PARADA Sr.  Procedure(s) Performed: Procedure(s) (LRB): COLONOSCOPY WITH PROPOFOL (N/A)  Patient location during evaluation: PACU Anesthesia Type: General Level of consciousness: awake and alert Pain management: pain level controlled Vital Signs Assessment: post-procedure vital signs reviewed and stable Respiratory status: spontaneous breathing, nonlabored ventilation, respiratory function stable and patient connected to nasal cannula oxygen Cardiovascular status: blood pressure returned to baseline and stable Postop Assessment: no signs of nausea or vomiting Anesthetic complications: no     Last Vitals:  Vitals:   09/09/16 1634 09/09/16 1644  BP: (!) 112/92 129/74  Pulse: (!) 42 (!) 41  Resp: 19 13  Temp:      Last Pain:  Vitals:   09/10/16 0730  TempSrc:   PainSc: 0-No pain                 Molli Barrows

## 2016-11-18 ENCOUNTER — Encounter (INDEPENDENT_AMBULATORY_CARE_PROVIDER_SITE_OTHER): Payer: Self-pay | Admitting: Vascular Surgery

## 2016-11-18 ENCOUNTER — Ambulatory Visit (INDEPENDENT_AMBULATORY_CARE_PROVIDER_SITE_OTHER): Payer: Medicare Other | Admitting: Vascular Surgery

## 2016-11-18 VITALS — BP 152/85 | HR 63 | Resp 16 | Ht 69.0 in | Wt 349.0 lb

## 2016-11-18 DIAGNOSIS — E785 Hyperlipidemia, unspecified: Secondary | ICD-10-CM

## 2016-11-18 DIAGNOSIS — I10 Essential (primary) hypertension: Secondary | ICD-10-CM | POA: Diagnosis not present

## 2016-11-18 DIAGNOSIS — R600 Localized edema: Secondary | ICD-10-CM | POA: Diagnosis not present

## 2016-11-18 DIAGNOSIS — R0989 Other specified symptoms and signs involving the circulatory and respiratory systems: Secondary | ICD-10-CM | POA: Diagnosis not present

## 2016-11-18 NOTE — Progress Notes (Signed)
Subjective:    Patient ID: Colin Coe., male    DOB: 09/17/1950, 66 y.o.   MRN: [id number] Chief Complaint  Patient presents with  . New Patient (Initial Visit)    Left Leg pulse absent   Presents as a new patient referred by Dr. Jefm Bryant for "absent pedal pulses". Patient underwent a recent annual physical exam and states physician was unable to feel pulses" his feet". Patient denies any claudication-like symptoms, rest pain or ulceration to his lower extremity. Patient does inform that after walking long distances his lower extremity does feel "tired" and "aching". Patient experiences bilateral lower extremity edema. States his edema is worse in the left lower extremity when compared to the right. The swelling "worsens towards the end of the day". At this time, the patient does not wear medical grade 1 compression stockings or elevate his legs on that basis. Patient denies any trauma or DVT to the lower extremity. Patient denies any fever, nausea or vomiting.   Review of Systems  Constitutional: Negative.   HENT: Negative.   Eyes: Negative.   Respiratory: Negative.   Cardiovascular: Positive for leg swelling.  Gastrointestinal: Negative.   Endocrine: Negative.   Genitourinary: Negative.   Musculoskeletal: Negative.   Skin: Negative.   Allergic/Immunologic: Negative.   Neurological: Negative.   Hematological: Negative.   Psychiatric/Behavioral: Negative.       Objective:   Physical Exam  Constitutional: He is oriented to person, place, and time. He appears well-developed and well-nourished. No distress.  HENT:  Head: Normocephalic and atraumatic.  Eyes: Pupils are equal, round, and reactive to light. Conjunctivae are normal.  Neck: Normal range of motion.  Cardiovascular: Normal rate, regular rhythm, normal heart sounds and intact distal pulses.   Pulses:      Radial pulses are 2+ on the right side, and 2+ on the left side.  Unable to palpate pedal pulses due to edema and  body habitus  Pulmonary/Chest: Effort normal.  Musculoskeletal: Normal range of motion. He exhibits edema (right lower extremity: mild to moderate edema. Left lower extremity: moderate edema).  Neurological: He is alert and oriented to person, place, and time.  Skin: He is not diaphoretic.  Bilateral moderate stasis dermatitis noted. Fibrosis noted bilaterally.  Psychiatric: He has a normal mood and affect. His behavior is normal. Judgment and thought content normal.  Vitals reviewed.  BP (!) 152/85 (BP Location: Right Wrist)   Pulse 63   Resp 16   Ht 5\' 9"  (1.753 m)   Wt (!) 349 lb (158.3 kg)   BMI 51.54 kg/m   Past Medical History:  Diagnosis Date  . Arthritis   . BPH (benign prostatic hyperplasia)   . Cancer [facility])    Prostate CA  . Cancer ([place])   . Diabetes mellitus without complication ([place])   . GERD (gastroesophageal reflux disease)   . High cholesterol   . Hypertension   . Vitamin D deficiency    Social History   Social History  . Marital status: Married    Spouse name: N/A  . Number of children: N/A  . Years of education: N/A   Occupational History  . Not on file.   Social History Main Topics  . Smoking status: Never Smoker  . Smokeless tobacco: Never Used  . Alcohol use Yes     Comment: social  . Drug use: No  . Sexual activity: Not on file   Other Topics Concern  . Not on file   Social  History Narrative  . No narrative on file   Past Surgical History:  Procedure Laterality Date  . COLONOSCOPY WITH PROPOFOL N/A 09/09/2016   Procedure: COLONOSCOPY WITH PROPOFOL;  Surgeon: Lollie Sails, MD;  Location: [facility] ENDOSCOPY;  Service: Endoscopy;  Laterality: N/A;  . Partial kidney resection    . Prostate seed implantation      Family History  Problem Relation Age of Onset  . Colon cancer Mother   . Lung cancer Father   . Benign prostatic hyperplasia Father    No Known Allergies     Assessment & Plan:  Presents as a new patient referred by  Dr. Jefm Bryant for "absent pedal pulses". Patient underwent a recent annual physical exam and states physician was unable to feel pulses" his feet". Patient denies any claudication-like symptoms, rest pain or ulceration to his lower extremity. Patient does inform that after walking long distances his lower extremity does feel "tired" and "aching". Patient experiences bilateral lower extremity edema. States his edema is worse in the left lower extremity when compared to the right. The swelling "worsens towards the end of the day". At this time, the patient does not wear medical grade 1 compression stockings or elevate his legs on that basis. Patient denies any trauma or DVT to the lower extremity. Patient denies any fever, nausea or vomiting.  1. Absent pedal pulses - New Patient with absent pedal pulses on exam Multiple risk factors for peripheral artery disease We'll order an ABI to assess for any peripheral artery disease that may be contributing to the absent pedal pulses  - VAS [place] ABI WITH/WO TBI; Future  2. Bilateral lower extremity edema - New Patient with moderate bilateral edema on exam Fibrosis is severe stasis dermatitis are noted The patient was encouraged to wear graduated compression stockings (20-30 mmHg) on a daily basis. The patient was instructed to begin wearing the stockings first thing in the morning and removing them in the evening. The patient was instructed specifically not to sleep in the stockings. Prescription given. In addition, behavioral modification including elevation during the day will be initiated. We'll order a bilateral venous duplex to rule out any contributing venous reflux  - VAS [place] LOWER EXTREMITY VENOUS REFLUX; Future  3. Hyperlipidemia, unspecified hyperlipidemia type - Stable Encouraged good control as its slows the progression of atherosclerotic disease  4. Essential hypertension - Stable Encouraged good control as its slows the progression of  atherosclerotic disease  Current Outpatient Prescriptions on File Prior to Visit  Medication Sig Dispense Refill  . acetaminophen (TYLENOL) 325 MG tablet Take 650 mg by mouth every 6 (six) hours as needed for mild pain or headache.    Marland Kitchen amLODipine (NORVASC) 10 MG tablet Take 10 mg by mouth daily.    Marland Kitchen aspirin EC 81 MG tablet Take 81 mg by mouth daily.    . benazepril-hydrochlorthiazide (LOTENSIN HCT) 20-25 MG tablet Take 1 tablet by mouth daily.    Marland Kitchen docusate sodium (COLACE) 100 MG capsule Take 200 mg by mouth daily.    . fluticasone (FLONASE) 50 MCG/ACT nasal spray Place 2 sprays into both nostrils daily.    . furosemide (LASIX) 20 MG tablet Take 20 mg by mouth.    . loratadine (CLARITIN) 10 MG tablet Take 1 tablet (10 mg total) by mouth daily. 30 tablet 0  . losartan (COZAAR) 100 MG tablet Take 100 mg by mouth daily.    . meclizine (ANTIVERT) 25 MG tablet Take 1 tablet (25 mg total) by  mouth 2 (two) times daily as needed for dizziness. 30 tablet 0  . meloxicam (MOBIC) 15 MG tablet Take 15 mg by mouth daily.    . nitroGLYCERIN (NITROSTAT) 0.4 MG SL tablet Place 0.4 mg under the tongue every 5 (five) minutes as needed for chest pain.    Marland Kitchen omeprazole (PRILOSEC) 20 MG capsule Take 20 mg by mouth daily.    . pravastatin (PRAVACHOL) 80 MG tablet Take 80 mg by mouth at bedtime.    . tamsulosin (FLOMAX) 0.4 MG CAPS capsule Take 0.4 mg by mouth daily.    . traMADol (ULTRAM) 50 MG tablet Take by mouth every 6 (six) hours as needed.     No current facility-administered medications on file prior to visit.     There are no Patient Instructions on file for this visit. No Follow-up on file.   Calyn Sivils A Racquelle Hyser, PA-C

## 2016-11-27 DIAGNOSIS — Z01810 Encounter for preprocedural cardiovascular examination: Secondary | ICD-10-CM | POA: Insufficient documentation

## 2016-12-24 ENCOUNTER — Ambulatory Visit (INDEPENDENT_AMBULATORY_CARE_PROVIDER_SITE_OTHER): Payer: Medicare Other | Admitting: Vascular Surgery

## 2016-12-24 ENCOUNTER — Ambulatory Visit (INDEPENDENT_AMBULATORY_CARE_PROVIDER_SITE_OTHER): Payer: Medicare Other

## 2016-12-24 ENCOUNTER — Encounter (INDEPENDENT_AMBULATORY_CARE_PROVIDER_SITE_OTHER): Payer: Self-pay | Admitting: Vascular Surgery

## 2016-12-24 VITALS — BP 151/80 | HR 62 | Resp 17 | Ht 69.0 in | Wt 349.0 lb

## 2016-12-24 DIAGNOSIS — R0989 Other specified symptoms and signs involving the circulatory and respiratory systems: Secondary | ICD-10-CM

## 2016-12-24 DIAGNOSIS — E785 Hyperlipidemia, unspecified: Secondary | ICD-10-CM | POA: Diagnosis not present

## 2016-12-24 DIAGNOSIS — R600 Localized edema: Secondary | ICD-10-CM

## 2016-12-24 NOTE — Progress Notes (Signed)
Subjective:    Patient ID: Colin Coe., male    DOB: June 12, 1950, 66 y.o.   MRN: [id number] Chief Complaint  Patient presents with  . Follow-up    pt conv abi,bil ven reflux   Patient presents to review vascular studies. The patient was last seen on 11/18/2016 in the evaluation of "absent pedal pulses". The patient presents today without complaint. He patient will possibly be undergoing bilateral knee replacements in the future. He continues to have pain located primarily in the knee. Since our last visit, the patient has started to wear medical grade 1 compression stockings and engaging and elevation. He notes minimal improvement to is symptoms. The patient underwent a bilateral ABI which was notable for triphasic bilateral tibials and no significant lower extremity arterial disease. Bilateral toe brachial indices were normal. There is no previous ankle brachial index for comparison.The patient underwent a bilateral lower extremity venous reflux exam which was notable for no deep vein thrombosis or superficial thrombophlebitis of the bilateral lower extremity. The patient has incompetence of the bilateral great saphenous veins incompetence of the right small saphenous veins.    Review of Systems  Constitutional: Negative.   HENT: Negative.   Eyes: Negative.   Respiratory: Negative.   Cardiovascular:       Bilateral knee pain  Gastrointestinal: Negative.   Endocrine: Negative.   Genitourinary: Negative.   Musculoskeletal: Negative.   Skin: Negative.   Allergic/Immunologic: Negative.   Neurological: Negative.   Hematological: Negative.   Psychiatric/Behavioral: Negative.       Objective:   Physical Exam  Constitutional: He is oriented to person, place, and time. He appears well-developed and well-nourished. No distress.  HENT:  Head: Normocephalic and atraumatic.  Eyes: Conjunctivae are normal. Pupils are equal, round, and reactive to light.  Neck: Normal range of motion.    Cardiovascular: Normal rate, regular rhythm, normal heart sounds and intact distal pulses.  Pulses:      Radial pulses are 2+ on the right side, and 2+ on the left side.  Hard to palpate pedal pulses due to edema  Pulmonary/Chest: Effort normal and breath sounds normal.  Musculoskeletal: Normal range of motion. He exhibits edema (moderate bilateral edema noted).  Neurological: He is alert and oriented to person, place, and time.  Skin: Skin is warm and dry. He is not diaphoretic.  Psychiatric: He has a normal mood and affect. His behavior is normal. Judgment and thought content normal.  Vitals reviewed.  BP (!) 151/80 (BP Location: Right Arm)   Pulse 62   Resp 17   Ht 5\' 9"  (1.753 m)   Wt (!) 349 lb (158.3 kg)   BMI 51.54 kg/m   Past Medical History:  Diagnosis Date  . Arthritis   . BPH (benign prostatic hyperplasia)   . Cancer [facility])    Prostate CA  . Cancer ([place])   . Diabetes mellitus without complication ([place])   . GERD (gastroesophageal reflux disease)   . High cholesterol   . Hypertension   . Vitamin D deficiency    Social History   Socioeconomic History  . Marital status: Married    Spouse name: Not on file  . Number of children: Not on file  . Years of education: Not on file  . Highest education level: Not on file  Social Needs  . Financial resource strain: Not on file  . Food insecurity - worry: Not on file  . Food insecurity - inability: Not on file  .  Transportation needs - medical: Not on file  . Transportation needs - non-medical: Not on file  Occupational History  . Not on file  Tobacco Use  . Smoking status: Never Smoker  . Smokeless tobacco: Never Used  Substance and Sexual Activity  . Alcohol use: Yes    Comment: social  . Drug use: No  . Sexual activity: Not on file  Other Topics Concern  . Not on file  Social History Narrative  . Not on file   Past Surgical History:  Procedure Laterality Date  . Partial kidney resection    . Prostate  seed implantation     Family History  Problem Relation Age of Onset  . Colon cancer Mother   . Lung cancer Father   . Benign prostatic hyperplasia Father    No Known Allergies     Assessment & Plan:  Patient presents to review vascular studies. The patient was last seen on 11/18/2016 in the evaluation of "absent pedal pulses". The patient presents today without complaint. He patient will possibly be undergoing bilateral knee replacements in the future. He continues to have pain located primarily in the knee. Since our last visit, the patient has started to wear medical grade 1 compression stockings and engaging and elevation. He notes minimal improvement to is symptoms. The patient underwent a bilateral ABI which was notable for triphasic bilateral tibials and no significant lower extremity arterial disease. Bilateral toe brachial indices were normal. There is no previous ankle brachial index for comparison.The patient underwent a bilateral lower extremity venous reflux exam which was notable for no deep vein thrombosis or superficial thrombophlebitis of the bilateral lower extremity. The patient has incompetence of the bilateral great saphenous veins incompetence of the right small saphenous veins.   1. Absent pedal pulses - Stable Patient with absent pedal pulses most likely anatomical in nature The patient had a normal ABI Patient with pain primarily in the knees He denies any claudication-like symptoms, rest pain or ulceration   2. Bilateral lower extremity edema - Stable The patient has bilateral greater saphenous vein reflux and right small saphenous vein reflux At this time, the patient is not interested in any additional therapy I recommend continuing conservative therapy including wearing medical grade 1 compression stockings on engaging and elevation The patient will call our office if he changes his mind  3. Hyperlipidemia, unspecified hyperlipidemia type - Stable Encouraged  good control as its slows the progression of atherosclerotic disease  Current Outpatient Medications on File Prior to Visit  Medication Sig Dispense Refill  . acetaminophen (TYLENOL) 325 MG tablet Take 650 mg by mouth every 6 (six) hours as needed for mild pain or headache.    Marland Kitchen amLODipine (NORVASC) 10 MG tablet Take 10 mg by mouth daily.    Marland Kitchen aspirin EC 81 MG tablet Take 81 mg by mouth daily.    . benazepril-hydrochlorthiazide (LOTENSIN HCT) 20-25 MG tablet Take 1 tablet by mouth daily.    Marland Kitchen docusate sodium (COLACE) 100 MG capsule Take 200 mg by mouth daily.    . fluticasone (FLONASE) 50 MCG/ACT nasal spray Place 2 sprays into both nostrils daily.    . furosemide (LASIX) 20 MG tablet Take 20 mg by mouth.    . loratadine (CLARITIN) 10 MG tablet Take 1 tablet (10 mg total) by mouth daily. 30 tablet 0  . losartan (COZAAR) 100 MG tablet Take 100 mg by mouth daily.    . meclizine (ANTIVERT) 25 MG tablet Take 1 tablet (25  mg total) by mouth 2 (two) times daily as needed for dizziness. 30 tablet 0  . meloxicam (MOBIC) 15 MG tablet Take 15 mg by mouth daily.    . nitroGLYCERIN (NITROSTAT) 0.4 MG SL tablet Place 0.4 mg under the tongue every 5 (five) minutes as needed for chest pain.    Marland Kitchen omeprazole (PRILOSEC) 20 MG capsule Take 20 mg by mouth daily.    . pravastatin (PRAVACHOL) 80 MG tablet Take 80 mg by mouth at bedtime.    . tamsulosin (FLOMAX) 0.4 MG CAPS capsule Take 0.4 mg by mouth daily.    . traMADol (ULTRAM) 50 MG tablet Take by mouth every 6 (six) hours as needed.     No current facility-administered medications on file prior to visit.    There are no Patient Instructions on file for this visit. No Follow-up on file.  Aiyannah Fayad A Ricka Westra, PA-C

## 2017-01-01 ENCOUNTER — Other Ambulatory Visit: Payer: Self-pay | Admitting: Orthopedic Surgery

## 2017-01-01 DIAGNOSIS — M1712 Unilateral primary osteoarthritis, left knee: Secondary | ICD-10-CM

## 2017-01-07 ENCOUNTER — Ambulatory Visit
Admission: RE | Admit: 2017-01-07 | Discharge: 2017-01-07 | Disposition: A | Discharge status: Home / Self Care | Payer: Medicare Other | Source: Ambulatory Visit | Attending: Orthopedic Surgery | Admitting: Orthopedic Surgery

## 2017-01-07 ENCOUNTER — Ambulatory Visit: Payer: Medicare Other

## 2017-01-07 DIAGNOSIS — M7122 Synovial cyst of popliteal space [Baker], left knee: Secondary | ICD-10-CM | POA: Diagnosis not present

## 2017-01-07 DIAGNOSIS — M1712 Unilateral primary osteoarthritis, left knee: Secondary | ICD-10-CM | POA: Diagnosis not present

## 2017-02-04 ENCOUNTER — Encounter
Admission: RE | Admit: 2017-02-04 | Discharge: 2017-02-04 | Disposition: A | Discharge status: Home / Self Care | Payer: Medicare Other | Source: Ambulatory Visit | Attending: Orthopedic Surgery | Admitting: Orthopedic Surgery

## 2017-02-04 ENCOUNTER — Other Ambulatory Visit: Payer: Self-pay

## 2017-02-04 DIAGNOSIS — Z01818 Encounter for other preprocedural examination: Secondary | ICD-10-CM | POA: Insufficient documentation

## 2017-02-04 DIAGNOSIS — R7303 Prediabetes: Secondary | ICD-10-CM | POA: Insufficient documentation

## 2017-02-04 DIAGNOSIS — R001 Bradycardia, unspecified: Secondary | ICD-10-CM | POA: Diagnosis not present

## 2017-02-04 DIAGNOSIS — I10 Essential (primary) hypertension: Secondary | ICD-10-CM | POA: Insufficient documentation

## 2017-02-04 DIAGNOSIS — Z0183 Encounter for blood typing: Secondary | ICD-10-CM | POA: Diagnosis not present

## 2017-02-04 DIAGNOSIS — Z01812 Encounter for preprocedural laboratory examination: Secondary | ICD-10-CM | POA: Diagnosis not present

## 2017-02-04 DIAGNOSIS — I440 Atrioventricular block, first degree: Secondary | ICD-10-CM | POA: Diagnosis not present

## 2017-02-04 HISTORY — DX: Personal history of urinary calculi: Z87.442

## 2017-02-04 HISTORY — DX: Prediabetes: R73.03

## 2017-02-04 HISTORY — DX: Sleep apnea, unspecified: G47.30

## 2017-02-04 LAB — URINALYSIS, ROUTINE W REFLEX MICROSCOPIC
BACTERIA UA: NONE SEEN
BILIRUBIN URINE: NEGATIVE
Glucose, UA: NEGATIVE mg/dL
Hgb urine dipstick: NEGATIVE
Ketones, ur: NEGATIVE mg/dL
LEUKOCYTES UA: NEGATIVE
NITRITE: NEGATIVE
PH: 5 (ref 5.0–8.0)
PROTEIN: NEGATIVE mg/dL
RBC / HPF: NONE SEEN RBC/hpf (ref 0–5)
Specific Gravity, Urine: 1.02 (ref 1.005–1.030)
Squamous Epithelial / LPF: NONE SEEN

## 2017-02-04 LAB — SURGICAL PCR SCREEN
MRSA, PCR: NEGATIVE
STAPHYLOCOCCUS AUREUS: NEGATIVE

## 2017-02-04 LAB — PROTIME-INR
INR: 1.17
Prothrombin Time: 14.8 seconds (ref 11.4–15.2)

## 2017-02-04 LAB — TYPE AND SCREEN
ABO/RH(D): O POS
Antibody Screen: NEGATIVE

## 2017-02-04 LAB — BASIC METABOLIC PANEL
Anion gap: 6 (ref 5–15)
BUN: 16 mg/dL (ref 6–20)
CHLORIDE: 106 mmol/L (ref 101–111)
CO2: 27 mmol/L (ref 22–32)
CREATININE: 1.03 mg/dL (ref 0.61–1.24)
Calcium: 9.1 mg/dL (ref 8.9–10.3)
GFR calc Af Amer: >60 mL/min (ref >60)
GFR calc non Af Amer: >60 mL/min (ref >60)
GLUCOSE: 119 mg/dL — AB (ref 65–99)
Potassium: 3.6 mmol/L (ref 3.5–5.1)
Sodium: 139 mmol/L (ref 135–145)

## 2017-02-04 LAB — CBC
HEMATOCRIT: 38.5 % — AB (ref 40.0–52.0)
HEMOGLOBIN: 12.8 g/dL — AB (ref 13.0–18.0)
MCH: 31.7 pg (ref 26.0–34.0)
MCHC: 33.1 g/dL (ref 32.0–36.0)
MCV: 95.6 fL (ref 80.0–100.0)
Platelets: 176 10*3/uL (ref 150–440)
RBC: 4.03 MIL/uL — ABNORMAL LOW (ref 4.40–5.90)
RDW: 14.7 % — AB (ref 11.5–14.5)
WBC: 4.5 10*3/uL (ref 3.8–10.6)

## 2017-02-04 LAB — SEDIMENTATION RATE: Sed Rate: 17 mm/hr (ref 0–20)

## 2017-02-04 LAB — APTT: aPTT: 30 seconds (ref 24–36)

## 2017-02-04 NOTE — Care Management (Signed)
EKG read. No significant changes noted.

## 2017-02-04 NOTE — Patient Instructions (Signed)
  Your procedure is scheduled [id number] Jan.3 , 2019. Report to Same Day Surgery. To find out your arrival time please call [phone] between 1PM - 3PM on Wednesday Jan. 2, 2019 .  Remember: Instructions that are not followed completely may result in serious medical risk, up to and including death, or upon the discretion of your surgeon and anesthesiologist your surgery may need to be rescheduled.    _x___ 1. Do not eat food after midnight night prior to surgery.     No gum chewing or hard candies, snacks or breakfast.     May drink the following up unti 2 hours prior to ARRIVAL to hospital:      water      Gatorade     clear apple juice      black coffee      or black tea      _x___ 2. No Alcohol for 24 hours before or after surgery.   ____ 3. Bring all medications with you on the day of surgery if instructed.    __x__ 4. Notify your doctor if there is any change in your medical condition     (cold, fever, infections).    _____ 5.   Do Not Smoke or use e-cigarettes For 24 Hours Prior to Your   Surgery.  Do not use any chewable tobacco products for at least 6   hours prior to  surgery.                  Do not wear jewelry, make-up, hairpins, clips or nail polish.  Do not wear lotions, powders, or perfumes.   Do not shave 48 hours prior to surgery. Men may shave face and neck.  Do not bring valuables to the hospital.    [facility] is not responsible for any belongings or valuables.               Contacts, dentures or bridgework may not be worn into surgery.  Leave your suitcase in the car. After surgery it may be brought to your room.  For patients admitted to the hospital, discharge time is determined by your  treatment team.   Patients discharged the day of surgery will not be allowed to drive home.    Please read over the following fact sheets that you were given:   [facility] Preparing for Surgery             _x___ Take these medicines the morning of  surgery with A SIP OF WATER:    1. omeprazole (PRILOSEC) take at bedtime the night prior to surgery and the morning of surgery.  2. traMADol (ULTRAM)     ____ Fleet Enema (as directed)   __x__ Use CHG Soap as directed on instruction sheet  ____ Use inhalers on the day of surgery and bring to hospital day of surgery  ____ Stop metformin 2 days prior to surgery    ____ Take 1/2 of usual insulin dose the night before surgery and none on the morning of          surgery.   _x___ Stop aspirin 7 days prior to surgery.  __x__ Stop Anti-inflammatories such as Advil, Aleve, Ibuprofen, meloxicam (MOBIC), Motrin, Naproxen,  Naprosyn, Goodies powders or aspirin products. OK to take Tylenol or Tramadol.   ____ Stop supplements until after surgery.    _x___ Bring C-Pap to the hospital.

## 2017-02-05 LAB — URINE CULTURE: Culture: NO GROWTH

## 2017-02-09 NOTE — Pre-Procedure Instructions (Signed)
UC FAXED TO DR HOOTEN 

## 2017-02-18 MED ORDER — TRANEXAMIC ACID 1000 MG/10ML IV SOLN
1000.0000 mg | INTRAVENOUS | Status: AC
  Administered 2017-02-19: 1000 mg via INTRAVENOUS
  Filled 2017-02-18: qty 10

## 2017-02-18 MED ORDER — DEXTROSE 5 % IV SOLN
3.0000 g | Freq: Once | INTRAVENOUS | Status: AC
  Administered 2017-02-19: 3 g via INTRAVENOUS
  Filled 2017-02-18: qty 3000

## 2017-02-19 ENCOUNTER — Encounter
Admission: RE | Disposition: A | Discharge status: Skilled Nursing Facility | Payer: Self-pay | Source: Ambulatory Visit | Attending: Orthopedic Surgery

## 2017-02-19 ENCOUNTER — Ambulatory Visit: Payer: Medicare Other | Admitting: Anesthesiology

## 2017-02-19 ENCOUNTER — Inpatient Hospital Stay
Admission: RE | Admit: 2017-02-19 | Discharge: 2017-02-22 | DRG: 470 | Disposition: A | Discharge status: Skilled Nursing Facility | Payer: Medicare Other | Source: Ambulatory Visit | Attending: Orthopedic Surgery | Admitting: Orthopedic Surgery

## 2017-02-19 ENCOUNTER — Other Ambulatory Visit: Payer: Self-pay

## 2017-02-19 ENCOUNTER — Encounter: Payer: Self-pay | Admitting: *Deleted

## 2017-02-19 ENCOUNTER — Inpatient Hospital Stay: Payer: Medicare Other

## 2017-02-19 DIAGNOSIS — N400 Enlarged prostate without lower urinary tract symptoms: Secondary | ICD-10-CM | POA: Diagnosis present

## 2017-02-19 DIAGNOSIS — Z85528 Personal history of other malignant neoplasm of kidney: Secondary | ICD-10-CM

## 2017-02-19 DIAGNOSIS — K0889 Other specified disorders of teeth and supporting structures: Secondary | ICD-10-CM | POA: Diagnosis present

## 2017-02-19 DIAGNOSIS — E7800 Pure hypercholesterolemia, unspecified: Secondary | ICD-10-CM | POA: Diagnosis present

## 2017-02-19 DIAGNOSIS — K219 Gastro-esophageal reflux disease without esophagitis: Secondary | ICD-10-CM | POA: Diagnosis present

## 2017-02-19 DIAGNOSIS — Z905 Acquired absence of kidney: Secondary | ICD-10-CM | POA: Diagnosis not present

## 2017-02-19 DIAGNOSIS — Z9989 Dependence on other enabling machines and devices: Secondary | ICD-10-CM

## 2017-02-19 DIAGNOSIS — Z8546 Personal history of malignant neoplasm of prostate: Secondary | ICD-10-CM | POA: Diagnosis not present

## 2017-02-19 DIAGNOSIS — Z972 Presence of dental prosthetic device (complete) (partial): Secondary | ICD-10-CM

## 2017-02-19 DIAGNOSIS — Z79899 Other long term (current) drug therapy: Secondary | ICD-10-CM | POA: Diagnosis not present

## 2017-02-19 DIAGNOSIS — K5900 Constipation, unspecified: Secondary | ICD-10-CM | POA: Diagnosis present

## 2017-02-19 DIAGNOSIS — Z7982 Long term (current) use of aspirin: Secondary | ICD-10-CM | POA: Diagnosis not present

## 2017-02-19 DIAGNOSIS — G8918 Other acute postprocedural pain: Secondary | ICD-10-CM

## 2017-02-19 DIAGNOSIS — G4730 Sleep apnea, unspecified: Secondary | ICD-10-CM | POA: Diagnosis present

## 2017-02-19 DIAGNOSIS — Z6841 Body Mass Index (BMI) 40.0 and over, adult: Secondary | ICD-10-CM

## 2017-02-19 DIAGNOSIS — I10 Essential (primary) hypertension: Secondary | ICD-10-CM | POA: Diagnosis present

## 2017-02-19 DIAGNOSIS — Z87442 Personal history of urinary calculi: Secondary | ICD-10-CM | POA: Diagnosis not present

## 2017-02-19 DIAGNOSIS — R7303 Prediabetes: Secondary | ICD-10-CM | POA: Diagnosis present

## 2017-02-19 DIAGNOSIS — Z86010 Personal history of colonic polyps: Secondary | ICD-10-CM

## 2017-02-19 DIAGNOSIS — Z8042 Family history of malignant neoplasm of prostate: Secondary | ICD-10-CM

## 2017-02-19 DIAGNOSIS — M1712 Unilateral primary osteoarthritis, left knee: Secondary | ICD-10-CM | POA: Diagnosis present

## 2017-02-19 HISTORY — PX: TOTAL KNEE ARTHROPLASTY: SHX125

## 2017-02-19 LAB — GLUCOSE, CAPILLARY: Glucose-Capillary: 124 mg/dL — ABNORMAL HIGH (ref 65–99)

## 2017-02-19 LAB — CBC
HCT: 38.7 % — ABNORMAL LOW (ref 40.0–52.0)
Hemoglobin: 12.9 g/dL — ABNORMAL LOW (ref 13.0–18.0)
MCH: 32 pg (ref 26.0–34.0)
MCHC: 33.3 g/dL (ref 32.0–36.0)
MCV: 96.3 fL (ref 80.0–100.0)
PLATELETS: 190 10*3/uL (ref 150–440)
RBC: 4.02 MIL/uL — ABNORMAL LOW (ref 4.40–5.90)
RDW: 14.6 % — AB (ref 11.5–14.5)
WBC: 10.3 10*3/uL (ref 3.8–10.6)

## 2017-02-19 LAB — TYPE AND SCREEN
ABO/RH(D): O POS
Antibody Screen: NEGATIVE

## 2017-02-19 LAB — CREATININE, SERUM
Creatinine, Ser: 1.21 mg/dL (ref 0.61–1.24)
GFR, EST NON AFRICAN AMERICAN: 60 mL/min — AB (ref >60)

## 2017-02-19 SURGERY — ARTHROPLASTY, KNEE, TOTAL
Anesthesia: Spinal | Laterality: Left

## 2017-02-19 MED ORDER — ONDANSETRON HCL 4 MG/2ML IJ SOLN: 4.0000 mg | Freq: Four times a day (QID) | INTRAMUSCULAR | Status: DC | PRN

## 2017-02-19 MED ORDER — POLYETHYLENE GLYCOL 3350 17 G PO PACK
17.0000 g | PACK | Freq: Every day | ORAL | Status: DC
  Administered 2017-02-20 – 2017-02-22 (×3): 17 g via ORAL
  Filled 2017-02-19 (×3): qty 1

## 2017-02-19 MED ORDER — EPHEDRINE SULFATE 50 MG/ML IJ SOLN
INTRAMUSCULAR | Status: AC
  Filled 2017-02-19: qty 1

## 2017-02-19 MED ORDER — NEOMYCIN-POLYMYXIN B GU 40-200000 IR SOLN
Status: DC | PRN
  Administered 2017-02-19: 16 mL

## 2017-02-19 MED ORDER — FENTANYL CITRATE (PF) 100 MCG/2ML IJ SOLN
INTRAMUSCULAR | Status: AC
  Filled 2017-02-19: qty 2

## 2017-02-19 MED ORDER — LIDOCAINE HCL (PF) 2 % IJ SOLN
INTRAMUSCULAR | Status: AC
  Filled 2017-02-19: qty 10

## 2017-02-19 MED ORDER — OXYCODONE HCL 5 MG PO TABS
5.0000 mg | ORAL_TABLET | ORAL | Status: DC | PRN
  Administered 2017-02-19 – 2017-02-22 (×4): 5 mg via ORAL
  Filled 2017-02-19 (×3): qty 1

## 2017-02-19 MED ORDER — PHENYLEPHRINE HCL 10 MG/ML IJ SOLN
INTRAMUSCULAR | Status: DC | PRN
  Administered 2017-02-19 (×2): 100 ug via INTRAVENOUS

## 2017-02-19 MED ORDER — SUGAMMADEX SODIUM 500 MG/5ML IV SOLN
INTRAVENOUS | Status: AC
  Filled 2017-02-19: qty 5

## 2017-02-19 MED ORDER — METOCLOPRAMIDE HCL 5 MG/ML IJ SOLN: 5.0000 mg | Freq: Three times a day (TID) | INTRAMUSCULAR | Status: DC | PRN

## 2017-02-19 MED ORDER — BUPIVACAINE-EPINEPHRINE (PF) 0.25% -1:200000 IJ SOLN
INTRAMUSCULAR | Status: DC | PRN
  Administered 2017-02-19: 30 mL via PERINEURAL

## 2017-02-19 MED ORDER — MAGNESIUM HYDROXIDE 400 MG/5ML PO SUSP: 30.0000 mL | Freq: Every day | ORAL | Status: DC | PRN

## 2017-02-19 MED ORDER — FLUTICASONE PROPIONATE 50 MCG/ACT NA SUSP
1.0000 | Freq: Every day | NASAL | Status: DC | PRN
  Filled 2017-02-19: qty 16

## 2017-02-19 MED ORDER — LORATADINE 10 MG PO TABS: 10.0000 mg | ORAL_TABLET | Freq: Every day | ORAL | Status: DC | PRN

## 2017-02-19 MED ORDER — ACETAMINOPHEN 10 MG/ML IV SOLN
INTRAVENOUS | Status: DC | PRN
  Administered 2017-02-19: 1000 mg via INTRAVENOUS

## 2017-02-19 MED ORDER — SODIUM CHLORIDE 0.9 % IJ SOLN
INTRAMUSCULAR | Status: AC
  Filled 2017-02-19: qty 20

## 2017-02-19 MED ORDER — DOCUSATE SODIUM 100 MG PO CAPS
200.0000 mg | ORAL_CAPSULE | Freq: Every day | ORAL | Status: DC
  Administered 2017-02-20 – 2017-02-22 (×3): 200 mg via ORAL
  Filled 2017-02-19 (×3): qty 2

## 2017-02-19 MED ORDER — ONDANSETRON HCL 4 MG PO TABS: 4.0000 mg | ORAL_TABLET | Freq: Four times a day (QID) | ORAL | Status: DC | PRN

## 2017-02-19 MED ORDER — METHOCARBAMOL 500 MG PO TABS
500.0000 mg | ORAL_TABLET | Freq: Four times a day (QID) | ORAL | Status: DC | PRN
  Administered 2017-02-20 – 2017-02-21 (×5): 500 mg via ORAL
  Filled 2017-02-19 (×5): qty 1

## 2017-02-19 MED ORDER — LOSARTAN POTASSIUM 50 MG PO TABS
100.0000 mg | ORAL_TABLET | Freq: Every day | ORAL | Status: DC
  Administered 2017-02-20 – 2017-02-22 (×3): 100 mg via ORAL
  Filled 2017-02-19 (×3): qty 2

## 2017-02-19 MED ORDER — ONDANSETRON HCL 4 MG/2ML IJ SOLN: 4.0000 mg | Freq: Once | INTRAMUSCULAR | Status: DC | PRN

## 2017-02-19 MED ORDER — SODIUM CHLORIDE 0.9 % IJ SOLN
INTRAMUSCULAR | Status: DC | PRN
  Administered 2017-02-19: 68 mL

## 2017-02-19 MED ORDER — FUROSEMIDE 20 MG PO TABS
20.0000 mg | ORAL_TABLET | Freq: Every day | ORAL | Status: DC
  Administered 2017-02-21 – 2017-02-22 (×2): 20 mg via ORAL
  Filled 2017-02-19 (×2): qty 1

## 2017-02-19 MED ORDER — MIDAZOLAM HCL 5 MG/5ML IJ SOLN
INTRAMUSCULAR | Status: DC | PRN
  Administered 2017-02-19: 2 mg via INTRAVENOUS

## 2017-02-19 MED ORDER — ROCURONIUM BROMIDE 50 MG/5ML IV SOLN
INTRAVENOUS | Status: AC
  Filled 2017-02-19: qty 1

## 2017-02-19 MED ORDER — VASOPRESSIN 20 UNIT/ML IV SOLN
INTRAVENOUS | Status: DC | PRN
  Administered 2017-02-19 (×3): 1 [IU] via INTRAVENOUS

## 2017-02-19 MED ORDER — ATORVASTATIN CALCIUM 20 MG PO TABS
80.0000 mg | ORAL_TABLET | Freq: Every day | ORAL | Status: DC
  Administered 2017-02-19 – 2017-02-21 (×3): 80 mg via ORAL
  Filled 2017-02-19 (×3): qty 4

## 2017-02-19 MED ORDER — AMLODIPINE BESYLATE 10 MG PO TABS
10.0000 mg | ORAL_TABLET | Freq: Every day | ORAL | Status: DC
  Administered 2017-02-19 – 2017-02-20 (×2): 10 mg via ORAL
  Filled 2017-02-19 (×3): qty 1

## 2017-02-19 MED ORDER — PROPOFOL 500 MG/50ML IV EMUL
INTRAVENOUS | Status: AC
  Filled 2017-02-19: qty 50

## 2017-02-19 MED ORDER — TAMSULOSIN HCL 0.4 MG PO CAPS
0.4000 mg | ORAL_CAPSULE | Freq: Every day | ORAL | Status: DC
  Administered 2017-02-20 – 2017-02-22 (×3): 0.4 mg via ORAL
  Filled 2017-02-19 (×3): qty 1

## 2017-02-19 MED ORDER — PHENYLEPHRINE HCL 10 MG/ML IJ SOLN
INTRAMUSCULAR | Status: AC
  Filled 2017-02-19: qty 1

## 2017-02-19 MED ORDER — FENTANYL CITRATE (PF) 100 MCG/2ML IJ SOLN
INTRAMUSCULAR | Status: DC | PRN
  Administered 2017-02-19 (×5): 50 ug via INTRAVENOUS

## 2017-02-19 MED ORDER — OXYCODONE HCL 5 MG PO TABS
10.0000 mg | ORAL_TABLET | ORAL | Status: DC | PRN
  Administered 2017-02-19 – 2017-02-22 (×13): 10 mg via ORAL
  Filled 2017-02-19 (×14): qty 2

## 2017-02-19 MED ORDER — ACETAMINOPHEN 650 MG RE SUPP: 650.0000 mg | RECTAL | Status: DC | PRN

## 2017-02-19 MED ORDER — FENTANYL CITRATE (PF) 100 MCG/2ML IJ SOLN
25.0000 ug | INTRAMUSCULAR | Status: DC | PRN
  Administered 2017-02-19 (×3): 25 ug via INTRAVENOUS

## 2017-02-19 MED ORDER — SUCCINYLCHOLINE CHLORIDE 20 MG/ML IJ SOLN
INTRAMUSCULAR | Status: AC
  Filled 2017-02-19: qty 1

## 2017-02-19 MED ORDER — SUCCINYLCHOLINE CHLORIDE 20 MG/ML IJ SOLN
INTRAMUSCULAR | Status: DC | PRN
  Administered 2017-02-19: 120 mg via INTRAVENOUS

## 2017-02-19 MED ORDER — MORPHINE SULFATE (PF) 10 MG/ML IV SOLN
INTRAVENOUS | Status: AC
  Filled 2017-02-19: qty 1

## 2017-02-19 MED ORDER — ONDANSETRON HCL 4 MG/2ML IJ SOLN
INTRAMUSCULAR | Status: DC | PRN
  Administered 2017-02-19: 4 mg via INTRAVENOUS

## 2017-02-19 MED ORDER — ROCURONIUM BROMIDE 100 MG/10ML IV SOLN
INTRAVENOUS | Status: DC | PRN
  Administered 2017-02-19: 40 mg via INTRAVENOUS
  Administered 2017-02-19: 20 mg via INTRAVENOUS

## 2017-02-19 MED ORDER — PHENOL 1.4 % MT LIQD
1.0000 | OROMUCOSAL | Status: DC | PRN
  Filled 2017-02-19: qty 177

## 2017-02-19 MED ORDER — MORPHINE SULFATE 10 MG/ML IJ SOLN
INTRAMUSCULAR | Status: DC | PRN
  Administered 2017-02-19: 10 mg via INTRAVENOUS

## 2017-02-19 MED ORDER — DEXTROSE 5 % IV SOLN
2.0000 g | Freq: Four times a day (QID) | INTRAVENOUS | Status: AC
  Administered 2017-02-19 (×3): 2 g via INTRAVENOUS
  Filled 2017-02-19 (×3): qty 20

## 2017-02-19 MED ORDER — HYDROMORPHONE HCL 1 MG/ML IJ SOLN: 1.0000 mg | INTRAMUSCULAR | Status: DC | PRN

## 2017-02-19 MED ORDER — MIDAZOLAM HCL 2 MG/2ML IJ SOLN
INTRAMUSCULAR | Status: AC
  Filled 2017-02-19: qty 2

## 2017-02-19 MED ORDER — NEOMYCIN-POLYMYXIN B GU 40-200000 IR SOLN
Status: AC
  Filled 2017-02-19: qty 20

## 2017-02-19 MED ORDER — PANTOPRAZOLE SODIUM 40 MG PO TBEC
80.0000 mg | DELAYED_RELEASE_TABLET | Freq: Every day | ORAL | Status: DC
  Administered 2017-02-20 – 2017-02-22 (×3): 80 mg via ORAL
  Filled 2017-02-19 (×3): qty 2

## 2017-02-19 MED ORDER — BUPIVACAINE LIPOSOME 1.3 % IJ SUSP
INTRAMUSCULAR | Status: AC
  Filled 2017-02-19: qty 20

## 2017-02-19 MED ORDER — VASOPRESSIN 20 UNIT/ML IV SOLN
INTRAVENOUS | Status: AC
  Filled 2017-02-19: qty 1

## 2017-02-19 MED ORDER — NITROGLYCERIN 0.4 MG SL SUBL: 0.4000 mg | SUBLINGUAL_TABLET | SUBLINGUAL | Status: DC | PRN

## 2017-02-19 MED ORDER — SODIUM CHLORIDE 0.9 % IV SOLN
INTRAVENOUS | Status: DC
  Administered 2017-02-19 – 2017-02-20 (×2): via INTRAVENOUS

## 2017-02-19 MED ORDER — MECLIZINE HCL 25 MG PO TABS
25.0000 mg | ORAL_TABLET | Freq: Two times a day (BID) | ORAL | Status: DC | PRN
  Administered 2017-02-19 – 2017-02-20 (×2): 25 mg via ORAL
  Filled 2017-02-19 (×4): qty 1

## 2017-02-19 MED ORDER — BUPIVACAINE HCL (PF) 0.5 % IJ SOLN
INTRAMUSCULAR | Status: AC
  Filled 2017-02-19: qty 10

## 2017-02-19 MED ORDER — LIDOCAINE HCL (CARDIAC) 20 MG/ML IV SOLN
INTRAVENOUS | Status: DC | PRN
  Administered 2017-02-19: 100 mg via INTRAVENOUS

## 2017-02-19 MED ORDER — BUPIVACAINE LIPOSOME 1.3 % IJ SUSP
INTRAMUSCULAR | Status: DC | PRN
  Administered 2017-02-19: 60 mL

## 2017-02-19 MED ORDER — ENOXAPARIN SODIUM 30 MG/0.3ML ~~LOC~~ SOLN
30.0000 mg | Freq: Two times a day (BID) | SUBCUTANEOUS | Status: DC
  Administered 2017-02-20 – 2017-02-22 (×5): 30 mg via SUBCUTANEOUS
  Filled 2017-02-19 (×5): qty 0.3

## 2017-02-19 MED ORDER — PROPOFOL 10 MG/ML IV BOLUS
INTRAVENOUS | Status: DC | PRN
  Administered 2017-02-19: 250 mg via INTRAVENOUS

## 2017-02-19 MED ORDER — BUPIVACAINE-EPINEPHRINE (PF) 0.25% -1:200000 IJ SOLN
INTRAMUSCULAR | Status: AC
  Filled 2017-02-19: qty 30

## 2017-02-19 MED ORDER — ONDANSETRON HCL 4 MG/2ML IJ SOLN
INTRAMUSCULAR | Status: AC
  Filled 2017-02-19: qty 2

## 2017-02-19 MED ORDER — ASPIRIN EC 81 MG PO TBEC
81.0000 mg | DELAYED_RELEASE_TABLET | Freq: Every day | ORAL | Status: DC
  Administered 2017-02-20 – 2017-02-22 (×3): 81 mg via ORAL
  Filled 2017-02-19 (×3): qty 1

## 2017-02-19 MED ORDER — SODIUM CHLORIDE 0.9 % IJ SOLN
INTRAMUSCULAR | Status: AC
  Filled 2017-02-19: qty 50

## 2017-02-19 MED ORDER — METHOCARBAMOL 1000 MG/10ML IJ SOLN
500.0000 mg | Freq: Four times a day (QID) | INTRAMUSCULAR | Status: DC | PRN
  Filled 2017-02-19: qty 5

## 2017-02-19 MED ORDER — SUGAMMADEX SODIUM 500 MG/5ML IV SOLN
INTRAVENOUS | Status: DC | PRN
  Administered 2017-02-19: 320 mg via INTRAVENOUS

## 2017-02-19 MED ORDER — ACETAMINOPHEN 325 MG PO TABS
650.0000 mg | ORAL_TABLET | ORAL | Status: DC | PRN
  Administered 2017-02-20: 650 mg via ORAL
  Filled 2017-02-19: qty 2

## 2017-02-19 MED ORDER — METOCLOPRAMIDE HCL 10 MG PO TABS: 5.0000 mg | ORAL_TABLET | Freq: Three times a day (TID) | ORAL | Status: DC | PRN

## 2017-02-19 MED ORDER — EPHEDRINE SULFATE 50 MG/ML IJ SOLN
INTRAMUSCULAR | Status: DC | PRN
  Administered 2017-02-19 (×4): 10 mg via INTRAVENOUS

## 2017-02-19 MED ORDER — ALUM & MAG HYDROXIDE-SIMETH 200-200-20 MG/5ML PO SUSP
30.0000 mL | ORAL | Status: DC | PRN
  Administered 2017-02-19: 30 mL via ORAL
  Filled 2017-02-19: qty 30

## 2017-02-19 MED ORDER — ZOLPIDEM TARTRATE 5 MG PO TABS: 5.0000 mg | ORAL_TABLET | Freq: Every evening | ORAL | Status: DC | PRN

## 2017-02-19 MED ORDER — ACETAMINOPHEN 10 MG/ML IV SOLN
INTRAVENOUS | Status: AC
  Filled 2017-02-19: qty 100

## 2017-02-19 MED ORDER — FENTANYL CITRATE (PF) 100 MCG/2ML IJ SOLN
INTRAMUSCULAR | Status: AC
  Administered 2017-02-19: 25 ug via INTRAVENOUS
  Filled 2017-02-19: qty 2

## 2017-02-19 MED ORDER — SODIUM CHLORIDE 0.9 % IV SOLN
INTRAVENOUS | Status: DC
  Administered 2017-02-19: 07:00:00 via INTRAVENOUS

## 2017-02-19 MED ORDER — BISACODYL 10 MG RE SUPP
10.0000 mg | Freq: Every day | RECTAL | Status: DC | PRN
  Administered 2017-02-22: 10 mg via RECTAL
  Filled 2017-02-19: qty 1

## 2017-02-19 MED ORDER — MENTHOL 3 MG MT LOZG
1.0000 | LOZENGE | OROMUCOSAL | Status: DC | PRN
  Filled 2017-02-19: qty 9

## 2017-02-19 MED ORDER — MAGNESIUM CITRATE PO SOLN: 1.0000 | Freq: Once | ORAL | Status: DC | PRN

## 2017-02-19 SURGICAL SUPPLY — 69 items
BANDAGE ACE 6X5 VEL STRL LF (GAUZE/BANDAGES/DRESSINGS) ×9 IMPLANT
BLADE SAW 1 (BLADE) ×3 IMPLANT
BLOCK CUTTING FEMUR 4 LT MED (MISCELLANEOUS) IMPLANT
BLOCK CUTTING TIBIAL 5 LT (MISCELLANEOUS) IMPLANT
BONE CEMENT GENTAMICIN (Cement) ×6 IMPLANT
CANISTER SUCT 1200ML W/VALVE (MISCELLANEOUS) ×3 IMPLANT
CANISTER SUCT 3000ML PPV (MISCELLANEOUS) ×6 IMPLANT
CAPT KNEE TOTAL 3 ×3 IMPLANT
CEMENT BONE GENTAMICIN 40 (Cement) ×2 IMPLANT
CHLORAPREP W/TINT 26ML (MISCELLANEOUS) ×6 IMPLANT
COOLER POLAR GLACIER W/PUMP (MISCELLANEOUS) ×3 IMPLANT
CUFF TOURN 24 STER (MISCELLANEOUS) IMPLANT
CUFF TOURN 30 STER DUAL PORT (MISCELLANEOUS) IMPLANT
CUFF TOURN SGL QUICK 34 (TOURNIQUET CUFF) ×2
CUFF TRNQT CYL 34X4X40X1 (TOURNIQUET CUFF) ×1 IMPLANT
DRAPE INCISE IOBAN 66X60 STRL (DRAPES) ×3 IMPLANT
DRAPE SHEET LG 3/4 BI-LAMINATE (DRAPES) ×6 IMPLANT
ELECT CAUTERY BLADE 6.4 (BLADE) ×3 IMPLANT
ELECT REM PT RETURN 9FT ADLT (ELECTROSURGICAL) ×3
ELECTRODE REM PT RTRN 9FT ADLT (ELECTROSURGICAL) ×1 IMPLANT
GAUZE PETRO XEROFOAM 1X8 (MISCELLANEOUS) ×3 IMPLANT
GAUZE SPONGE 4X4 12PLY STRL (GAUZE/BANDAGES/DRESSINGS) ×3 IMPLANT
GLOVE BIOGEL PI IND STRL 9 (GLOVE) ×1 IMPLANT
GLOVE BIOGEL PI INDICATOR 9 (GLOVE) ×2
GLOVE INDICATOR 8.0 STRL GRN (GLOVE) ×15 IMPLANT
GLOVE SURG ORTHO 8.0 STRL STRW (GLOVE) ×18 IMPLANT
GLOVE SURG SYN 9.0  PF PI (GLOVE) ×2
GLOVE SURG SYN 9.0 PF PI (GLOVE) ×1 IMPLANT
GOWN SRG 2XL LVL 4 RGLN SLV (GOWNS) ×1 IMPLANT
GOWN STRL NON-REIN 2XL LVL4 (GOWNS) ×2
GOWN STRL REUS W/ TWL LRG LVL3 (GOWN DISPOSABLE) ×1 IMPLANT
GOWN STRL REUS W/ TWL XL LVL3 (GOWN DISPOSABLE) ×1 IMPLANT
GOWN STRL REUS W/TWL LRG LVL3 (GOWN DISPOSABLE) ×2
GOWN STRL REUS W/TWL XL LVL3 (GOWN DISPOSABLE) ×2
HOLDER FOLEY CATH W/STRAP (MISCELLANEOUS) ×3 IMPLANT
HOOD PEEL AWAY FLYTE STAYCOOL (MISCELLANEOUS) ×6 IMPLANT
IMMBOLIZER KNEE 19 BLUE UNIV (SOFTGOODS) ×3 IMPLANT
KIT PREVENA INCISION MGT20CM45 (CANNISTER) ×3 IMPLANT
KIT RM TURNOVER STRD PROC AR (KITS) ×3 IMPLANT
KNEE MEDACTA TIBIAL/FEMORAL BL (Knees) ×3 IMPLANT
KNIFE SCULPS 14X20 (INSTRUMENTS) ×3 IMPLANT
NDL SAFETY ECLIPSE 18X1.5 (NEEDLE) ×1 IMPLANT
NEEDLE HYPO 18GX1.5 SHARP (NEEDLE) ×2
NEEDLE SPNL 18GX3.5 QUINCKE PK (NEEDLE) ×3 IMPLANT
NEEDLE SPNL 20GX3.5 QUINCKE YW (NEEDLE) ×3 IMPLANT
NS IRRIG 1000ML POUR BTL (IV SOLUTION) ×3 IMPLANT
PACK TOTAL KNEE (MISCELLANEOUS) ×3 IMPLANT
PAD WRAPON POLAR KNEE (MISCELLANEOUS) ×1 IMPLANT
PAD WRAPON POLOR MULTI XL (MISCELLANEOUS) ×1 IMPLANT
PULSAVAC PLUS IRRIG FAN TIP (DISPOSABLE) ×3
SOL .9 NS 3000ML IRR  AL (IV SOLUTION) ×2
SOL .9 NS 3000ML IRR UROMATIC (IV SOLUTION) ×1 IMPLANT
STAPLER SKIN PROX 35W (STAPLE) ×3 IMPLANT
SUCTION FRAZIER HANDLE 10FR (MISCELLANEOUS) ×2
SUCTION TUBE FRAZIER 10FR DISP (MISCELLANEOUS) ×1 IMPLANT
SUT DVC 2 QUILL PDO  T11 36X36 (SUTURE) ×2
SUT DVC 2 QUILL PDO T11 36X36 (SUTURE) ×1 IMPLANT
SUT V-LOC 90 ABS DVC 3-0 CL (SUTURE) ×3 IMPLANT
SYR 20CC LL (SYRINGE) ×3 IMPLANT
SYR 50ML LL SCALE MARK (SYRINGE) ×6 IMPLANT
TIBIAL BONE MODEL LEFT (MISCELLANEOUS) IMPLANT
TIP FAN IRRIG PULSAVAC PLUS (DISPOSABLE) ×1 IMPLANT
TOWEL OR 17X26 4PK STRL BLUE (TOWEL DISPOSABLE) ×3 IMPLANT
TOWER CARTRIDGE SMART MIX (DISPOSABLE) ×3 IMPLANT
TRAY FOLEY W/METER SILVER 16FR (SET/KITS/TRAYS/PACK) ×3 IMPLANT
WND VAC CANISTER 500ML (MISCELLANEOUS) ×3 IMPLANT
WRAP-ON POLOR PAD MULTI XL (MISCELLANEOUS) ×1
WRAPON POLAR PAD KNEE (MISCELLANEOUS) ×3
WRAPON POLOR PAD MULTI XL (MISCELLANEOUS) ×2

## 2017-02-19 NOTE — Anesthesia Procedure Notes (Signed)
Procedure Name: Intubation Performed by: Lance Muss, CRNA Pre-anesthesia Checklist: Patient identified, Patient being monitored, Timeout performed, Emergency Drugs available and Suction available Patient Re-evaluated:Patient Re-evaluated prior to induction Oxygen Delivery Method: Circle system utilized Preoxygenation: Pre-oxygenation with 100% oxygen Induction Type: IV induction Ventilation: Mask ventilation without difficulty, Oral airway inserted - appropriate to patient size and Two handed mask ventilation required Laryngoscope Size: Mac and 3 Grade View: Grade I Tube type: Oral Tube size: 7.5 mm Number of attempts: 1 Airway Equipment and Method: Stylet Placement Confirmation: ETT inserted through vocal cords under direct vision,  positive ETCO2 and breath sounds checked- equal and bilateral Secured at: 23 cm Tube secured with: Tape Dental Injury: Teeth and Oropharynx as per pre-operative assessment

## 2017-02-19 NOTE — Anesthesia Post-op Follow-up Note (Signed)
Anesthesia QCDR form completed.        

## 2017-02-19 NOTE — Anesthesia Postprocedure Evaluation (Signed)
Anesthesia Post Note  Patient: Colin CHANDRAN Sr.  Procedure(s) Performed: TOTAL KNEE ARTHROPLASTY (Left )  Patient location during evaluation: PACU Anesthesia Type: Spinal Level of consciousness: awake and alert Pain management: pain level controlled Vital Signs Assessment: post-procedure vital signs reviewed and stable Respiratory status: spontaneous breathing and respiratory function stable Cardiovascular status: stable Anesthetic complications: no     Last Vitals:  Vitals:   02/19/17 1040 02/19/17 1045  BP: (!) 101/57   Pulse: 72 69  Resp: 20 10  Temp:    SpO2: 95% 100%    Last Pain:  Vitals:   02/19/17 1045  TempSrc:   PainSc: 5                  KEPHART,WILLIAM K

## 2017-02-19 NOTE — Clinical Social Work Note (Signed)
Clinical Social Work Assessment  Patient Details  Name: Colin ISHAM Sr. MRN: [id number] Date of Birth: 01/19/1951  Date of referral:  02/19/17               Reason for consult:  Facility Placement                Permission sought to share information with:  Chartered certified accountant granted to share information::  Yes, Verbal Permission Granted  Name::      [place]::   [place]   Relationship::     Contact Information:     Housing/Transportation Living arrangements for the past 2 months:  [place] of Information:  Patient, Spouse Patient Interpreter Needed:  None Criminal Activity/Legal Involvement Pertinent to Current Situation/Hospitalization:  No - Comment as needed Significant Relationships:  Spouse Lives with:  Spouse Do you feel safe going back to the place where you live?  Yes Need for family participation in patient care:  Yes (Comment)  Care giving concerns:  Patient lives in [place] with his wife Colin Mccoy.    Social Worker assessment / plan:  Holiday representative (CSW) received SNF consult. PT is recommending SNF. CSW met with patient today on post op day 0. Patient was alert and oriented X4 and was sitting up in the chair at bedside. Patient's wife Colin Mccoy and his pastor were at bedside. CSW introduced self and explained role of CSW department. Patient reported that he lives in [place] with his wife. CSW explained SNF process and that medicare requires a 3 night inpatient qualifying stay in a hospital in order to pay for SNF. Patient was admitted to inpatient on 02/19/17. Patient verbalized his understanding and is agreeable to SNF search in [place]. FL2 complete and faxed out. Patient prefers [facility]. CSW will continue to follow and assist as needed.   Employment status:  Retired Forensic scientist:  [employer] PT Recommendations:  [place] / Referral to community resources:   [place]  Patient/Family's Response to care:  Patient is agreeable to [facility] in [place].   Patient/Family's Understanding of and Emotional Response to Diagnosis, Current Treatment, and Prognosis:  Patient and his wife Colin Mccoy were very pleasant and thanked CSW for assistance.   Emotional Assessment Appearance:  Appears stated age Attitude/Demeanor/Rapport:    Affect (typically observed):  Accepting, Adaptable, Pleasant Orientation:  Oriented to Self, Oriented to Place, Oriented to  Time, Oriented to Situation Alcohol / Substance use:  Not Applicable Psych involvement (Current and /or in the community):  No (Comment)  Discharge Needs  Concerns to be addressed:  Discharge Planning Concerns Readmission within the last 30 days:  No Current discharge risk:  Dependent with Mobility Barriers to Discharge:  Continued Medical Work up   [employer], Veronia Beets, LCSW 02/19/2017, 4:30 PM

## 2017-02-19 NOTE — Evaluation (Signed)
Physical Therapy Evaluation Patient Details Name: Colin GAMMEL Sr. MRN: [id number] DOB: 1950-09-21 Today's Date: 02/19/2017   History of Present Illness  Pt is a 67 y/o M s/p L TKA.  Pt's PMH includes cancer, and pt reported vertigo.    Clinical Impression  Pt is s/p L TKA resulting in the deficits listed below (see PT Problem List). [name] was Ind ambulating without AD PTA.  Pt c/o feeling "tired" and "woozy" throughout session which remains constant.  Pt reports this is his baseline.  He has seen a Advertising account planner but neither were able to determine a cause of his symptoms.  Pt has been diagnosed with vertigo and does take meclizine at times. BP supine at start of session 124/67 with pulse 80 and SpO2 100%, BP sitting EOB 109/61, BP sitting at end of session 114/62. Pt currently requires min assist to boost to standing and min assist to maintain balance while pivoting to chair.  Given pt's current mobility status, recommending SNF at d/c.  Pt will benefit from skilled PT to increase their independence and safety with mobility to allow discharge to the venue listed below.     Follow Up Recommendations SNF    Equipment Recommendations  None recommended by PT    Recommendations for Other Services       Precautions / Restrictions Precautions Precautions: Fall;Knee Precaution Booklet Issued: No Precaution Comments: Instructed pt in no pillow under knee Restrictions Weight Bearing Restrictions: Yes LLE Weight Bearing: Weight bearing as tolerated      Mobility  Bed Mobility Overal bed mobility: Needs Assistance Bed Mobility: Supine to Sit     Supine to sit: Min guard;HOB elevated     General bed mobility comments: Pt requires one rest break while performing supine>sit.  Pt with heavy use of bed rail.    Transfers Overall transfer level: Needs assistance Equipment used: (Bariatric RW) Transfers: Sit to/from [facility] Sit to Stand: Min  assist Stand pivot transfers: Min assist       General transfer comment: Cues for proper hand placement and safe technique.  Min assist to boost to standing.  Min assist to remain steady when pivoting.  Cues for upright posture as pt has tendency to shift weight anteriorly.   Ambulation/Gait             General Gait Details: Unable to attempt due to fatigue  Stairs            Wheelchair Mobility    Modified Rankin (Stroke Patients Only)       Balance Overall balance assessment: Needs assistance Sitting-balance support: No upper extremity supported;Feet supported Sitting balance-Leahy Scale: Fair     Standing balance support: Bilateral upper extremity supported;During functional activity Standing balance-Leahy Scale: Poor Standing balance comment: Pt relies on UE support for static standing and when pivoting to chair                             Pertinent Vitals/Pain Pain Assessment: Faces Faces Pain Scale: Hurts even more Pain Location: L knee Pain Descriptors / Indicators: Aching;Grimacing;Guarding;Moaning Pain Intervention(s): Limited activity within patient's tolerance;Monitored during session;Ice applied;Repositioned    Home Living Family/patient expects to be discharged to:: Skilled nursing facility Living Arrangements: Spouse/significant other Available Help at Discharge: Family;Available 24 hours/day Type of Home: House Home Access: Stairs to enter Entrance Stairs-Rails: None Entrance Stairs-Number of Steps: 2 Home Layout: One level Home Equipment: Walker - 2  wheels;Shower seat - built in Additional Comments: Pt's entrance in carport has 2 steps without rails.  Alternate entrance is back steps which pt can drive up to with 3 steps with Bil rails.     Prior Function Level of Independence: Independent         Comments: Pt was ambulating without AD.  No falls in the past 6 months.  Independent with all ADLs, IADLs.       Hand  Dominance        Extremity/Trunk Assessment   Upper Extremity Assessment Upper Extremity Assessment: Overall WFL for tasks assessed    Lower Extremity Assessment Lower Extremity Assessment: LLE deficits/detail LLE Deficits / Details: Unable to formally assess but pt able to perform SLR LLE: Unable to fully assess due to pain       Communication   Communication: No difficulties  Cognition Arousal/Alertness: Awake/alert;Lethargic(pt sleepy and closing eyes when not participating in activit) Behavior During Therapy: WFL for tasks assessed/performed Overall Cognitive Status: Within Functional Limits for tasks assessed                                        General Comments General comments (skin integrity, edema, etc.): Pt c/o feeling "tired" and "woozy" throughout session which remains constant.  Pt reports this is his baseline.  He has seen a Advertising account planner but neither were able to determine a cause of his symptoms.  Pt has been diagnosed with vertigo and does take meclizine at times. BP supine at start of session 124/67 with pulse 80 and SpO2 100%, BP sitting EOB 109/61, BP sitting at end of session 114/62.       Exercises Total Joint Exercises Ankle Circles/Pumps: AROM;Both;10 reps;Supine Quad Sets: Strengthening;Both;10 reps;Supine Gluteal Sets: Strengthening;Both;10 reps;Supine Heel Slides: Strengthening;Left;10 reps;Supine Straight Leg Raises: Strengthening;Left;10 reps;Supine Goniometric ROM: -10-85   Assessment/Plan    PT Assessment Patient needs continued PT services  PT Problem List Decreased strength;Decreased range of motion;Decreased activity tolerance;Decreased balance;Decreased mobility;Decreased knowledge of use of DME;Decreased safety awareness;Pain;Obesity       PT Treatment Interventions DME instruction;Gait training;Stair training;Functional mobility training;Therapeutic exercise;Therapeutic activities;Balance  training;Neuromuscular re-education;Patient/family education;Wheelchair mobility training;Modalities    PT Goals (Current goals can be found in the Care Plan section)  Acute Rehab PT Goals Patient Stated Goal: to return to PLOF; 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 : Unable Difficulty sitting down on and standing up from a chair with arms (e.g., wheelchair, bedside commode, etc,.)?: Unable Help needed moving to and from a bed to chair (including a wheelchair)?: A Lot Help needed walking in hospital room?: A Lot Help needed climbing 3-5 steps with a railing? : Total 6 Click Score: 8    End of Session Equipment Utilized During Treatment: Gait belt Activity Tolerance: Patient tolerated treatment well;Patient limited by fatigue Patient left: in chair;with call bell/phone within reach;with chair alarm set;with  family/visitor present;with SCD's reapplied;Other (comment)(polar care and towel roll) Nurse Communication: Mobility status;Other (comment)(no plug for wound vac; pt's "woozy" and "tired" reports; BP) PT Visit Diagnosis: Pain;Unsteadiness on feet (R26.81);Other abnormalities of gait and mobility (R26.89);Muscle weakness (generalized) (M62.81);Difficulty in walking, not elsewhere classified (R26.2) Pain - Right/Left: Left Pain - part of body: Knee    Time: 1340-1425 PT Time Calculation (min) (ACUTE ONLY): 45 min   Charges:   PT Evaluation $PT Eval Moderate Complexity: 1 Mod PT Treatments $Therapeutic  Exercise: 8-22 mins $Therapeutic Activity: 8-22 mins   PT G Codes:        Collie Siad PT, DPT 02/19/2017, 2:49 PM

## 2017-02-19 NOTE — Op Note (Signed)
02/19/2017  10:04 AM  PATIENT:  Colin Rote Sr.  67 y.o. male  PRE-OPERATIVE DIAGNOSIS:  PRIMARY OSTEOARTHRITIS OF LEFT KNEE  POST-OPERATIVE DIAGNOSIS:  PRIMARY OSTEOARTHRITIS OF LEFT KNEE  PROCEDURE:  Procedure(s): TOTAL KNEE ARTHROPLASTY (Left)  SURGEON: Laurene Footman, MD  ASSISTANTS: Rachelle Hora, [facility]  ANESTHESIA:   general  EBL:  No intake/output data recorded.  BLOOD ADMINISTERED:none  DRAINS: none   LOCAL MEDICATIONS USED:  MARCAINE    and OTHER morphine, Exparel  SPECIMEN:  No Specimen  DISPOSITION OF SPECIMEN:  N/A  COUNTS:  YES  TOURNIQUET:  * Missing tourniquet times found for documented tourniquets in log: [id number] *85 minutes at 300 mmHg  IMPLANTS: [place] sphere 4 left femur, 5 tibia short stem, 10 mm poly-insert with 3 patella, all components cemented  DICTATION: .Dragon Dictation  patient brought the operating room and after failed spinal and adequate general anesthesia was obtainedleftleg was prepped and draped in sterile fashion. After patient identification and timeout procedures were completed, leg was exsanguinated with an Esmarch and tourniquet raised a midline skin incision was made followed by medial parapatellar arthrotomy. The knee revealed Extensive erosion of the medial compartment with significant loss of bone as well as advanced patellofemoral degenerative change with exposed bone and mild lateral compartment degenerative change. There was exposed bone in all compartments. The fat pad and anterior cruciate ligament and PCL were excised and the proximal tibia cutting guide was applied to the proximal tibia and axial tibia cut carried out followed by distal femur cut.. The 4-in-1 cutting guide was applied to the femur anterior posterior and chamfer cuts made. The posterior horns of the menisci could be resected this time and the size5tibia baseplate trial was placed in apporpriote rotation.. The keel punch was placed followed by the 4 femoral  trial and a 39mm insert gave good stability through range of motion. This was thenchosen as the final implant. Distal femur drill holes were made followed by the trochlear groove cut and then trials were removed. The patella was affected with arthritis as welland resection was carried out drilling plate carried out and then a size 3 patella fit well. Thelocal anesthetic noted above was infiltrated in the para-articular tissue. The the bony surfaces thoroughly irrigated and dried. Tibial component was cemented in place first followed by the plastic insert and then the femoral component and the knee placed in extension with excess cement being removed. Next the patellar button was clamped into place and after the cement entirely set the clamp was removed the patella tracked well. .the tourniquetwas letdown,the knee was thoroughly irrigated with pulsatile Betadine lavage and then closed with a heavy Quill and Ethibond for the capsule, with 3-0 locking suture followed by skin staples. Incisional wound VAC then applied along with a Polar Care    PLAN OF CARE: Admit to inpatient   PATIENT DISPOSITION:  PACU - hemodynamically stable.

## 2017-02-19 NOTE — H&P (Signed)
Reviewed paper H+P, will be scanned into chart. No changes noted.  

## 2017-02-19 NOTE — Clinical Social Work Placement (Signed)
   CLINICAL SOCIAL WORK PLACEMENT  NOTE  Date:  02/19/2017  Patient Details  Name: Colin HANE Sr. MRN: [id number] Date of Birth: 01-06-1951  Clinical Social Work is seeking post-discharge placement for this patient at the [place] level of care (*CSW will initial, date and re-position this form in  chart as items are completed):  Yes   Patient/family provided with [place] Work Department's list of facilities offering this level of care within the geographic area requested by the patient (or if unable, by the patient's family).  Yes   Patient/family informed of their freedom to choose among providers that offer the needed level of care, that participate in Medicare, Medicaid or managed care program needed by the patient, have an available bed and are willing to accept the patient.  Yes   Patient/family informed of [facility]'s ownership interest in [facility] and [facility], as well as of the fact that they are under no obligation to receive care at these facilities.  PASRR submitted to EDS on 02/19/17     PASRR number received on 02/19/17     Existing PASRR number confirmed on       FL2 transmitted to all facilities in geographic area requested by pt/family on 02/19/17     FL2 transmitted to all facilities within larger geographic area on       Patient informed that his/her managed care company has contracts with or will negotiate with certain facilities, including the following:            Patient/family informed of bed offers received.  Patient chooses bed at       Physician recommends and patient chooses bed at      Patient to be transferred to   on  .  Patient to be transferred to facility by       Patient family notified on   of transfer.  Name of family member notified:        PHYSICIAN       Additional Comment:    _______________________________________________ Jonessa Triplett, Veronia Beets, LCSW 02/19/2017, 4:30 PM

## 2017-02-19 NOTE — Anesthesia Procedure Notes (Signed)
Spinal      Patient location during procedure: OR

## 2017-02-19 NOTE — NC FL2 (Signed)
[place] LEVEL OF CARE SCREENING TOOL     IDENTIFICATION  Patient Name: Colin Mccoy. Birthdate: February 24, 1950 Sex: male Admission Date (Current Location): 02/19/2017  [place] and [place] Number:  Engineering geologist and Address:  [facility], [address] [place], [place] [address]      Provider Number: [id number]  Attending Physician Name and Address:  Hessie Knows, MD  Relative Name and Phone Number:       Current Level of Care: Hospital Recommended Level of Care: [place] Prior Approval Number:    Date Approved/Denied:   PASRR Number: [id number])  Discharge Plan: SNF    Current Diagnoses: Patient Active Problem List   Diagnosis Date Noted  . Primary localized osteoarthritis of left knee 02/19/2017  . Hyperlipidemia 11/18/2016  . Essential hypertension 11/18/2016  . Bilateral lower extremity edema 11/18/2016  . Absent pedal pulses 11/18/2016  . Vertigo 01/30/2015    Orientation RESPIRATION BLADDER Height & Weight     Self, Time, Situation, Place  Normal Continent Weight: (!) 346 lb (156.9 kg) Height:  5\' 9"  (175.3 cm)  BEHAVIORAL SYMPTOMS/MOOD NEUROLOGICAL BOWEL NUTRITION STATUS      Continent Diet(Dieet: Clear Liquid to be Advanced. )  AMBULATORY STATUS COMMUNICATION OF NEEDS Skin   Extensive Assist Verbally Surgical wounds, Wound Vac(Incision: Left Knee. Provena wound vac. )                       Personal Care Assistance Level of Assistance  Bathing, Feeding, Dressing Bathing Assistance: Limited assistance Feeding assistance: Independent Dressing Assistance: Limited assistance     Functional Limitations Info  Sight, Hearing, Speech Sight Info: Adequate Hearing Info: Adequate Speech Info: Adequate    SPECIAL CARE FACTORS FREQUENCY  PT (By licensed PT), OT (By licensed OT)     PT Frequency: (5) OT Frequency: (5)            Contractures      Additional Factors Info  Code  Status, Allergies Code Status Info: (Full Code. ) Allergies Info: (No Known Allergies. )           Current Medications (02/19/2017):  This is the current hospital active medication list Current Facility-Administered Medications  Medication Dose Route Frequency Provider Last Rate Last Dose  . 0.9 %  sodium chloride infusion   Intravenous Continuous Hessie Knows, MD 75 mL/hr at 02/19/17 1500    . acetaminophen (TYLENOL) tablet 650 mg  650 mg Oral Q4H PRN Hessie Knows, MD       Or  . acetaminophen (TYLENOL) suppository 650 mg  650 mg Rectal Q4H PRN Hessie Knows, MD      . alum & mag hydroxide-simeth (MAALOX/MYLANTA) 200-200-20 MG/5ML suspension 30 mL  30 mL Oral Q4H PRN Hessie Knows, MD      . amLODipine (NORVASC) tablet 10 mg  10 mg Oral QHS Hessie Knows, MD      . aspirin EC tablet 81 mg  81 mg Oral Daily Hessie Knows, MD      . atorvastatin (LIPITOR) tablet 80 mg  80 mg Oral QHS Hessie Knows, MD      . bisacodyl (DULCOLAX) suppository 10 mg  10 mg Rectal Daily PRN Hessie Knows, MD      . ceFAZolin (ANCEF) 2 g in dextrose 5 % 100 mL IVPB  2 g Intravenous Q6H Hessie Knows, MD   Stopped at 02/19/17 1230  . docusate sodium (COLACE) capsule 200 mg  200 mg Oral Daily Hessie Knows, MD      . Derrill Memo ON 02/20/2017] enoxaparin (LOVENOX) injection 30 mg  30 mg Subcutaneous Q12H Hessie Knows, MD      . fluticasone [facility]) 50 MCG/ACT nasal spray 1 spray  1 spray Each Nare Daily PRN Hessie Knows, MD      . furosemide (LASIX) tablet 20 mg  20 mg Oral Daily Hessie Knows, MD      . HYDROmorphone (DILAUDID) injection 1 mg  1 mg Intravenous Q2H PRN Hessie Knows, MD      . loratadine (CLARITIN) tablet 10 mg  10 mg Oral Daily PRN Hessie Knows, MD      . Derrill Memo ON 02/20/2017] losartan (COZAAR) tablet 100 mg  100 mg Oral Daily Hessie Knows, MD      . magnesium citrate solution 1 Bottle  1 Bottle Oral Once PRN Hessie Knows, MD      . magnesium hydroxide (MILK OF MAGNESIA) suspension 30 mL  30 mL  Oral Daily PRN Hessie Knows, MD      . meclizine (ANTIVERT) tablet 25 mg  25 mg Oral BID PRN Hessie Knows, MD   25 mg at 02/19/17 1448  . menthol-cetylpyridinium (CEPACOL) lozenge 3 mg  1 lozenge Oral PRN Hessie Knows, MD       Or  . phenol (CHLORASEPTIC) mouth spray 1 spray  1 spray Mouth/Throat PRN Hessie Knows, MD      . methocarbamol (ROBAXIN) tablet 500 mg  500 mg Oral Q6H PRN Hessie Knows, MD       Or  . methocarbamol (ROBAXIN) 500 mg in dextrose 5 % 50 mL IVPB  500 mg Intravenous Q6H PRN Hessie Knows, MD      . metoCLOPramide (REGLAN) tablet 5-10 mg  5-10 mg Oral Q8H PRN Hessie Knows, MD       Or  . metoCLOPramide (REGLAN) injection 5-10 mg  5-10 mg Intravenous Q8H PRN Hessie Knows, MD      . nitroGLYCERIN (NITROSTAT) SL tablet 0.4 mg  0.4 mg Sublingual Q5 min PRN Hessie Knows, MD      . ondansetron [facility]) tablet 4 mg  4 mg Oral Q6H PRN Hessie Knows, MD       Or  . ondansetron [facility]) injection 4 mg  4 mg Intravenous Q6H PRN Hessie Knows, MD      . oxyCODONE (Oxy IR/ROXICODONE) immediate release tablet 10 mg  10 mg Oral Q3H PRN Hessie Knows, MD      . oxyCODONE (Oxy IR/ROXICODONE) immediate release tablet 5 mg  5 mg Oral Q3H PRN Hessie Knows, MD   5 mg at 02/19/17 1323  . [START ON 02/20/2017] pantoprazole (PROTONIX) EC tablet 80 mg  80 mg Oral QAC breakfast Hessie Knows, MD      . polyethylene glycol [facility]) packet 17 g  17 g Oral Daily Hessie Knows, MD      . Derrill Memo ON 02/20/2017] tamsulosin (FLOMAX) capsule 0.4 mg  0.4 mg Oral Daily Hessie Knows, MD      . zolpidem Lorrin Mais) tablet 5 mg  5 mg Oral QHS PRN Hessie Knows, MD         Discharge Medications: Please see discharge summary for a list of discharge medications.  Relevant Imaging Results:  Relevant Lab Results:   Additional Information (SSN: [id number])  Adele Milson, Veronia Beets, LCSW

## 2017-02-19 NOTE — Transfer of Care (Signed)
Immediate Anesthesia Transfer of Care Note  Patient: MONTERRIUS CARDOSA Sr.  Procedure(s) Performed: TOTAL KNEE ARTHROPLASTY (Left )  Patient Location: PACU  Anesthesia Type:General  Level of Consciousness: awake and responds to stimulation  Airway & Oxygen Therapy: Patient Spontanous Breathing and Patient connected to face mask oxygen  Post-op Assessment: Report given to RN and Post -op Vital signs reviewed and stable  Post vital signs: Reviewed and stable  Last Vitals:  Vitals:   02/19/17 0609 02/19/17 1010  BP: (!) 142/71 (!) 93/55  Pulse: 79 86  Resp: 18 20  Temp: (!) 36.4 C (!) 36.3 C  SpO2: 100% 100%    Last Pain:  Vitals:   02/19/17 0609  TempSrc: Tympanic  PainSc: 2          Complications: No apparent anesthesia complications

## 2017-02-19 NOTE — Anesthesia Preprocedure Evaluation (Signed)
Anesthesia Evaluation  Patient identified by MRN, date of birth, ID band Patient awake    Reviewed: Allergy & Precautions, NPO status , Patient's Chart, lab work & pertinent test results  History of Anesthesia Complications Negative for: history of anesthetic complications  Airway Mallampati: III       Dental  (+) Partial Upper, Loose,    Pulmonary sleep apnea and Continuous Positive Airway Pressure Ventilation , neg COPD,           Cardiovascular hypertension, Pt. on medications (-) Past MI and (-) CHF (-) dysrhythmias (-) Valvular Problems/Murmurs     Neuro/Psych neg Seizures    GI/Hepatic Neg liver ROS, GERD  Medicated and Poorly Controlled,  Endo/Other  diabetes ("pre-diabetes")  Renal/GU negative Renal ROS     Musculoskeletal   Abdominal   Peds  Hematology   Anesthesia Other Findings   Reproductive/Obstetrics                            Anesthesia Physical Anesthesia Plan  ASA: III  Anesthesia Plan: Spinal   Post-op Pain Management:    Induction:   PONV Risk Score and Plan:   Airway Management Planned: Nasal Cannula and Simple Face Mask  Additional Equipment:   Intra-op Plan:   Post-operative Plan:   Informed Consent: I have reviewed the patients History and Physical, chart, labs and discussed the procedure including the risks, benefits and alternatives for the proposed anesthesia with the patient or authorized representative who has indicated his/her understanding and acceptance.     Plan Discussed with:   Anesthesia Plan Comments:         Anesthesia Quick Evaluation

## 2017-02-19 NOTE — Progress Notes (Signed)
Patient is admitted to room 140 from OR. Patient bp 100/52. Sleepy, but able to answer questions. Family at bedside. Reviewed POC and orders. IV fluids continued. Foley, hemovac, Prevena wound vac, Polar care, and TED to right leg are in place. Bed alarm on for safety. Family to bring in home CPAP.

## 2017-02-20 ENCOUNTER — Encounter: Payer: Self-pay | Admitting: Orthopedic Surgery

## 2017-02-20 LAB — CBC
HCT: 36.9 % — ABNORMAL LOW (ref 40.0–52.0)
HEMOGLOBIN: 12.2 g/dL — AB (ref 13.0–18.0)
MCH: 31.7 pg (ref 26.0–34.0)
MCHC: 33.2 g/dL (ref 32.0–36.0)
MCV: 95.6 fL (ref 80.0–100.0)
PLATELETS: 176 10*3/uL (ref 150–440)
RBC: 3.86 MIL/uL — ABNORMAL LOW (ref 4.40–5.90)
RDW: 14.8 % — AB (ref 11.5–14.5)
WBC: 6.6 10*3/uL (ref 3.8–10.6)

## 2017-02-20 LAB — BASIC METABOLIC PANEL
Anion gap: 8 (ref 5–15)
BUN: 10 mg/dL (ref 6–20)
CALCIUM: 8.6 mg/dL — AB (ref 8.9–10.3)
CO2: 25 mmol/L (ref 22–32)
CREATININE: 0.95 mg/dL (ref 0.61–1.24)
Chloride: 103 mmol/L (ref 101–111)
Glucose, Bld: 138 mg/dL — ABNORMAL HIGH (ref 65–99)
Potassium: 3.9 mmol/L (ref 3.5–5.1)
SODIUM: 136 mmol/L (ref 135–145)

## 2017-02-20 NOTE — Progress Notes (Signed)
Physical Therapy Treatment Patient Details Name: FORTUNE BRANNIGAN Sr. MRN: [id number] DOB: Jul 06, 1950 Today's Date: 02/20/2017    History of Present Illness Pt is a 67 y/o M s/p L TKA.  Pt's PMH includes cancer, and pt reported vertigo.      PT Comments    Mr. Lor continues to make good progress.  Cues and demonstration for improved gait mechanics with pt ambulating 105 ft this session. Pt demonstrated improved safety with turning, keeping feet inside BOS of walker.  SNF remains appropriate d/c plan.     Follow Up Recommendations  SNF     Equipment Recommendations  None recommended by PT    Recommendations for Other Services       Precautions / Restrictions Precautions Precautions: Fall;Knee Restrictions Weight Bearing Restrictions: Yes LLE Weight Bearing: Weight bearing as tolerated    Mobility  Bed Mobility Overal bed mobility: Needs Assistance Bed Mobility: Supine to Sit;Sit to Supine     Supine to sit: Min guard;HOB elevated Sit to supine: Min guard   General bed mobility comments: Heavy use of bed rail with increased effort and time.   Transfers Overall transfer level: Needs assistance Equipment used: (Bariatric RW) Transfers: Sit to/from Stand Sit to Stand: Min assist;+2 physical assistance         General transfer comment: Assist to boost to standing and to control descent to sit.  Pt with poor eccentric lowering.   Ambulation/Gait Ambulation/Gait assistance: +2 safety/equipment;Min guard Ambulation Distance (Feet): 105 Feet Assistive device: (Bariatric RW) Gait Pattern/deviations: Step-to pattern;Decreased stance time - left;Decreased step length - right;Decreased weight shift to left;Antalgic;Trunk flexed;Wide base of support Gait velocity: decreased Gait velocity interpretation: Below normal speed for age/gender General Gait Details: Cues and demonstration for heel strike and toe off with promotion of L knee flexion at toe off.  Cues for upright posture  and to relax shoulders.     Stairs            Wheelchair Mobility    Modified Rankin (Stroke Patients Only)       Balance Overall balance assessment: Needs assistance Sitting-balance support: No upper extremity supported;Feet supported Sitting balance-Leahy Scale: Fair     Standing balance support: Bilateral upper extremity supported;During functional activity Standing balance-Leahy Scale: Poor Standing balance comment: Pt relies on UE support for static standing and when pivoting to chair                            Cognition Arousal/Alertness: Awake/alert;Lethargic(pt sleepy and closing eyes when not participating in activit) Behavior During Therapy: [facility] for tasks assessed/performed Overall Cognitive Status: Within Functional Limits for tasks assessed                                        Exercises Total Joint Exercises Ankle Circles/Pumps: AROM;Both;10 reps;Supine Quad Sets: Strengthening;Both;10 reps;Supine Heel Slides: Strengthening;Left;Supine;AROM;5 reps Straight Leg Raises: Strengthening;Left;5 reps;AAROM;Supine Knee Flexion: AAROM;Left;5 reps;Seated;Other (comment)(with 5 second holds)    General Comments        Pertinent Vitals/Pain Pain Assessment: Faces Faces Pain Scale: Hurts even more Pain Location: L knee Pain Descriptors / Indicators: Aching;Grimacing;Guarding;Moaning Pain Intervention(s): Limited activity within patient's tolerance;Monitored during session;Premedicated before session;Repositioned;Ice applied    Home Living                      Prior Function  PT Goals (current goals can now be found in the care plan section) Acute Rehab PT Goals Patient Stated Goal: to return to [facility]; 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 : Unable Difficulty sitting down on and standing up from a chair with arms (e.g., wheelchair, bedside commode, etc,.)?: Unable Help needed moving to and from a bed to chair (including a wheelchair)?: A Little Help needed walking in hospital room?: A Little Help needed climbing 3-5 steps with a railing? : Total 6 Click Score: 10    End of Session Equipment Utilized During Treatment: Gait belt Activity Tolerance: Patient tolerated treatment well;Patient limited by fatigue Patient left: with call bell/phone within reach;with family/visitor present;Other (comment);in bed;with bed alarm set;with nursing/sitter in room;with SCD's reapplied(polar care and bone foam; OT at bedside) Nurse Communication: Mobility status PT Visit Diagnosis: Pain;Unsteadiness on feet (R26.81);Other abnormalities of gait and mobility (R26.89);Muscle weakness (generalized) (M62.81);Difficulty in walking, not elsewhere classified (R26.2) Pain - Right/Left: Left Pain - part of body: Knee     Time: 1305-1330 PT Time Calculation (min) (ACUTE ONLY): 25 min  Charges:  $Gait Training: 8-22 mins $Therapeutic Exercise: 8-22 mins                    G Codes:       Collie Siad PT, DPT 02/20/2017, 1:40 PM

## 2017-02-20 NOTE — Evaluation (Addendum)
Occupational Therapy Evaluation Patient Details Name: Colin Mccoy. MRN: [id number] DOB: Feb 08, 1951 Today's Date: 02/20/2017    History of Present Illness Pt. is a 67 y.o. Male who was admitted to [facility] for a left TKR. Pt. PMHx includes: kidney CA, Prostate CA, GERD, Athritis, High Cholesterol, HTN, DM, Sleep Apnea, Vitamin D Deficiency.   Clinical Impression   Pt. Is a  67 y.o. male who was admitted to [facility] for a Left TKR.  Pt. presents with weakness, 5/10 knee pain, and limited functional mobility which hinder his ability to complete ADL, and IADL tasks. Pt. Resides at home with his wife, and son. Pt. Was independent with ADLs, IADLs, light meal preparation, medication management, driving, and working on his small farm growing vegetables. Upon arrival pt. had just returned to be with following PT. Pt. Education was provided verbally, and through visual demonstration about A/E use for LE ADLs.  Pt. Reports being very familiar with how to use the equipment, and has equipment at home. Pt. Declined attempt to practice with the equipment. Pt. Could benefit from OT services for ADL training, A/E training, and pt. Education about home modification, and DME. Pt. Plans to go to SNF upon discharge. Follow up OT services are recommended.    Follow Up Recommendations  SNF    Equipment Recommendations       Recommendations for Other Services       Precautions / Restrictions Precautions Precautions: Fall;Knee Precaution Booklet Issued: No Restrictions Weight Bearing Restrictions: Yes LLE Weight Bearing: Weight bearing as tolerated      Mobility Bed Mobility Overal bed mobility: Needs Assistance Bed Mobility: Supine to Sit;Sit to Supine     Supine to sit: Min guard;HOB elevated Sit to supine: Min guard   General bed mobility comments: Heavy use of bed rail with increased effort and time.   Transfers Overall transfer level: Needs assistance Equipment used: (Bariatric RW) Transfers: Sit  to/from Stand Sit to Stand: Min assist;+2 physical assistance         Mobility comments: Mobility pr PT       ADL either performed or assessed with clinical judgement   ADL Overall ADL's : Needs assistance/impaired Eating/Feeding: Set up;Independent   Grooming: Independent   Upper Body Bathing: Set up;Independent   Lower Body Bathing: Set up;Maximal assistance   Upper Body Dressing : Set up   Lower Body Dressing: Set up;Maximal assistance               Functional mobility during ADLs: Minimal assistance General ADL Comments: Pt. education was provided about A?E use for LE ADLs.      Vision         Perception     Praxis      Pertinent Vitals/Pain Pain Assessment: 0-10 Pain Score: 5  Faces Pain Scale: Hurts even more Pain Location: Left Knee Pain Descriptors / Indicators: Aching;Grimacing;Guarding;Moaning Pain Intervention(s): Limited activity within patient's tolerance;Monitored during session     Hand Dominance Right   Extremity/Trunk Assessment Upper Extremity Assessment Upper Extremity Assessment: Overall WFL for tasks assessed           Communication Communication Communication: No difficulties   Cognition Arousal/Alertness: Awake/alert Behavior During Therapy: WFL for tasks assessed/performed Overall Cognitive Status: Within Functional Limits for tasks assessed                                     General Comments  Exercises   Shoulder Instructions      Home Living Family/patient expects to be discharged to:: Private residence Living Arrangements: Spouse/significant other Available Help at Discharge: Available 24 hours/day Type of Home: House Home Access: Stairs to enter [employer] of Steps: 2 Entrance Stairs-Rails: None Home Layout: One level     Bathroom Shower/Tub: Occupational psychologist: Handicapped height     Home Equipment: Environmental consultant - 2 wheels;Shower seat - built in    Additional Comments: Pt's entrance in carport has 2 steps without rails.  Alternate entrance is back steps which pt can drive up to with 3 steps with Bil rails.       Prior Functioning/Environment Level of Independence: Independent        Comments: Pt was ambulating without AD.  No falls in the past 6 months.  Independent with all ADLs, IADLs.          OT Problem List: Decreased strength;Decreased range of motion;Decreased knowledge of use of DME or AE;Pain      OT Treatment/Interventions: Self-care/ADL training;Therapeutic activities;Therapeutic exercise;Patient/family education    OT Goals(Current goals can be found in the care plan section) Acute Rehab OT Goals Patient Stated Goal: To return home OT Goal Formulation: With patient Potential to Achieve Goals: Good  OT Frequency: Min 2X/week   Barriers to D/C:            Co-evaluation              AM-PAC PT "6 Clicks" Daily Activity     Outcome Measure Help from another person eating meals?: None Help from another person taking care of personal grooming?: None Help from another person toileting, which includes using toliet, bedpan, or urinal?: A Little Help from another person bathing (including washing, rinsing, drying)?: A Lot Help from another person to put on and taking off regular upper body clothing?: None Help from another person to put on and taking off regular lower body clothing?: A Lot 6 Click Score: 19   End of Session    Activity Tolerance: Patient tolerated treatment well Patient left: in bed  OT Visit Diagnosis: Muscle weakness (generalized) (M62.81)                Time: 4166-0630 OT Time Calculation (min): 15 min Charges:  OT General Charges $OT Visit: 1 Visit OT Evaluation $OT Eval Moderate Complexity: 1 Mod G-Codes:     Harrel Carina, MS, OTR/L   Harrel Carina, MS, OTR/L 02/20/2017, 2:06 PM

## 2017-02-20 NOTE — Progress Notes (Signed)
Clinical Education officer, museum (CSW) presented bed offers to patient and his wife Vickie. They chose [facility]. Plan is for patient to D/C to [facility] Sunday 02/22/17 pending medical clearance. [facility] liaison is aware of above.   [employer], LCSW [phone]

## 2017-02-20 NOTE — Progress Notes (Signed)
   Subjective: 1 Day Post-Op Procedure(s) (LRB): TOTAL KNEE ARTHROPLASTY (Left) Patient reports pain as severe.   Patient is well, and has had no acute complaints or problems Denies any CP, SOB, ABD pain. Plan is to go Skilled nursing facility after hospital stay.  Objective: Vital signs in last 24 hours: Temp:  [97.4 F (36.3 C)-99.6 F (37.6 C)] 99.6 F (37.6 C) (01/04 0431) Pulse Rate:  [60-86] 60 (01/04 0431) Resp:  [10-20] 15 (01/03 2007) BP: (93-130)/(46-75) 125/67 (01/04 0431) SpO2:  [95 %-100 %] 100 % (01/04 0431) Weight:  [346 lb (156.9 kg)] 346 lb (156.9 kg) (01/03 1130)  Intake/Output from previous day: 01/03 0701 - 01/04 0700 In: 3510 [P.O.:1380; I.V.:2030; IV Piggyback:100] Out: 2280 [Urine:2230; Blood:50] Intake/Output this shift: No intake/output data recorded.  Recent Labs    02/19/17 1249 02/20/17 0436  HGB 12.9* 12.2*   Recent Labs    02/19/17 1249 02/20/17 0436  WBC 10.3 6.6  RBC 4.02* 3.86*  HCT 38.7* 36.9*  PLT 190 176   Recent Labs    02/19/17 1249 02/20/17 0436  NA  --  136  K  --  3.9  CL  --  103  CO2  --  25  BUN  --  10  CREATININE 1.21 0.95  GLUCOSE  --  138*  CALCIUM  --  8.6*   No results for input(s): LABPT, INR in the last 72 hours.  EXAM General - Patient is Alert, Appropriate and Oriented Extremity - Neurovascular intact Sensation intact distally Intact pulses distally Dorsiflexion/Plantar flexion intact  - homans sign bilaterally Dressing - wound vac intact. 50 cc Drainage in wound vac Motor Function - intact, moving foot and toes well on exam.   Past Medical History:  Diagnosis Date  . Arthritis   . BPH (benign prostatic hyperplasia)   . Cancer ([place]) 2015   Prostate CA  . Cancer [facility]) 1996   left Kidney  . GERD (gastroesophageal reflux disease)   . High cholesterol   . History of kidney stones    left kidney several times  . Hypertension   . Pre-diabetes   . Sleep apnea   . Vitamin D deficiency      Assessment/Plan:   1 Day Post-Op Procedure(s) (LRB): TOTAL KNEE ARTHROPLASTY (Left) Active Problems:   Primary localized osteoarthritis of left knee  Estimated body mass index is 51.1 kg/m as calculated from the following:   Height as of this encounter: 5\' 9"  (1.753 m).   Weight as of this encounter: 346 lb (156.9 kg). Advance diet Up with therapy  Labs are stable VSS Needs BM Continue with current pain regimen Encouraged incentive spirometer TED hose BLE, remove at night time  DVT Prophylaxis - Lovenox, Foot Pumps and TED hose Weight-Bearing as tolerated to left leg   T. Rachelle Hora, PA-C [place] 02/20/2017, 7:59 AM

## 2017-02-20 NOTE — Progress Notes (Signed)
Physical Therapy Treatment Patient Details Name: Colin WINTERTON Sr. MRN: [id number] DOB: 1951/02/14 Today's Date: 02/20/2017    History of Present Illness Pt is a 67 y/o M s/p L TKA.  Pt's PMH includes cancer, and pt reported vertigo.      PT Comments    Colin Mccoy made good progress with mobility and therapeutic exercises this session.  He continues to require min assist for sit<>stand.  Pt with poor safety awareness managing RW and when turning stumbles as his feet are outside BOS of RW.  Cues to correct.  Pt requires 2 standing rest breaks due to fatigue while ambulating.  SNF remains appropriate d/c plan.     Follow Up Recommendations  SNF     Equipment Recommendations  None recommended by PT    Recommendations for Other Services       Precautions / Restrictions Precautions Precautions: Fall;Knee Restrictions Weight Bearing Restrictions: Yes LLE Weight Bearing: Weight bearing as tolerated    Mobility  Bed Mobility Overal bed mobility: Needs Assistance Bed Mobility: Supine to Sit     Supine to sit: Min guard;HOB elevated     General bed mobility comments: Heavy use of bed rail with increased effort and time.   Transfers Overall transfer level: Needs assistance Equipment used: (Bariatric RW) Transfers: Sit to/from Stand Sit to Stand: Min assist;+2 physical assistance         General transfer comment: Assist to boost to standing and to control descent to sit.   Ambulation/Gait Ambulation/Gait assistance: Min assist;+2 safety/equipment Ambulation Distance (Feet): 100 Feet Assistive device: (Bariatric RW) Gait Pattern/deviations: Step-to pattern;Decreased stance time - left;Decreased step length - right;Decreased weight shift to left;Antalgic;Trunk flexed;Wide base of support Gait velocity: decreased Gait velocity interpretation: Below normal speed for age/gender General Gait Details: Pt requires cues for upright posture as pt WBing heavily through RW and pushing  RW too far ahead.  Pt reports SOB, SpO2 95% on RA.  Pt requires 2 standing rest breaks due to fatigue.  Min assist to remain steady at times.  Pt with wide BOS and when turning has feet outside BOS of RW.  Cues to correct.     Stairs            Wheelchair Mobility    Modified Rankin (Stroke Patients Only)       Balance Overall balance assessment: Needs assistance Sitting-balance support: No upper extremity supported;Feet supported Sitting balance-Colin Mccoy: Fair     Standing balance support: Bilateral upper extremity supported;During functional activity Standing balance-Colin Mccoy: Poor Standing balance comment: Pt relies on UE support for static standing and when pivoting to chair                            Cognition Arousal/Alertness: Awake/alert;Lethargic(pt sleepy and closing eyes when not participating in activit) Behavior During Therapy: Colin Mccoy for tasks assessed/performed Overall Cognitive Status: Within Functional Limits for tasks assessed                                        Exercises Total Joint Exercises Ankle Circles/Pumps: AROM;Both;10 reps;Supine Quad Sets: Strengthening;Both;10 reps;Supine Heel Slides: Strengthening;Left;10 reps;Supine;AROM Knee Flexion: AAROM;Left;5 reps;Seated;Other (comment)(with 5 second holds)    General Comments        Pertinent Vitals/Pain Pain Assessment: Faces Faces Pain Mccoy: Hurts even more Pain Location: L knee Pain Descriptors /  Indicators: Aching;Grimacing;Guarding;Moaning Pain Intervention(s): Limited activity within patient's tolerance;Monitored during session;Repositioned;Premedicated before session;Ice applied    Home Living                      Prior Function            PT Goals (current goals can now be found in the care plan section) Acute Rehab PT Goals Patient Stated Goal: to return to Colin Mccoy; 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 : A Lot Difficulty sitting down on and standing up from a chair with arms (e.g., wheelchair, bedside commode, etc,.)?: Unable Help needed moving to and from a bed to chair (including a wheelchair)?: A Little Help needed walking in Mccoy room?: A Little Help needed climbing 3-5 steps with a railing? : Total 6 Click Score: 11    End of Session Equipment Utilized During Treatment: Gait belt Activity Tolerance: Patient tolerated treatment well;Patient limited by fatigue Patient left: in chair;with call bell/phone within reach;with chair alarm set;with family/visitor present;Other (comment)(polar care and bone foam) Nurse Communication: Mobility status PT Visit Diagnosis: Pain;Unsteadiness on feet (R26.81);Other abnormalities of gait and mobility (R26.89);Muscle weakness (generalized) (M62.81);Difficulty in walking, not elsewhere classified (R26.2) Pain - Right/Left: Left Pain - part of body: Knee     Time: 6415-8309 PT Time Calculation (min) (ACUTE ONLY): 29 min  Charges:  $Gait Training: 8-22 mins $Therapeutic Exercise: 8-22 mins                    G Codes:       Colin Mccoy PT, DPT 02/20/2017, 11:35 AM

## 2017-02-21 MED ORDER — ENOXAPARIN SODIUM 40 MG/0.4ML ~~LOC~~ SOLN: 40.0000 mg | SUBCUTANEOUS | 0 refills | Status: DC

## 2017-02-21 MED ORDER — OXYCODONE HCL 5 MG PO TABS: 5.0000 mg | ORAL_TABLET | ORAL | 0 refills | Status: DC | PRN

## 2017-02-21 NOTE — Progress Notes (Signed)
Patient a&o, VSS. Dressing dry and intact on left knee. Wound vac in place. No complaints. Pain controlled. Continue to monitor.

## 2017-02-21 NOTE — Plan of Care (Signed)
  Progressing Education: Knowledge of General Education information will improve 02/21/2017 1824 - Progressing by Montel Culver, RN Health Behavior/Discharge Planning: Ability to manage health-related needs will improve 02/21/2017 1824 - Progressing by Montel Culver, RN Clinical Measurements: Ability to maintain clinical measurements within normal limits will improve 02/21/2017 1824 - Progressing by Montel Culver, RN Will remain free from infection 02/21/2017 1824 - Progressing by Montel Culver, RN Diagnostic test results will improve 02/21/2017 1824 - Progressing by Montel Culver, RN Respiratory complications will improve 02/21/2017 1824 - Progressing by Montel Culver, RN Cardiovascular complication will be avoided 02/21/2017 1824 - Progressing by Montel Culver, RN Activity: Risk for activity intolerance will decrease 02/21/2017 1824 - Progressing by Montel Culver, RN Nutrition: Adequate nutrition will be maintained 02/21/2017 1824 - Progressing by Montel Culver, RN Coping: Level of anxiety will decrease 02/21/2017 1824 - Progressing by Montel Culver, RN Elimination: Will not experience complications related to bowel motility 02/21/2017 1824 - Progressing by Montel Culver, RN Will not experience complications related to urinary retention 02/21/2017 1824 - Progressing by Montel Culver, RN Pain Managment: General experience of comfort will improve 02/21/2017 1824 - Progressing by Montel Culver, RN Safety: Ability to remain free from injury will improve 02/21/2017 1824 - Progressing by Montel Culver, RN Skin Integrity: Risk for impaired skin integrity will decrease 02/21/2017 1824 - Progressing by Montel Culver, RN Education: Knowledge of the prescribed therapeutic regimen will improve 02/21/2017 1824 - Progressing by Montel Culver, RN Activity: Ability to avoid complications of mobility impairment will improve 02/21/2017 1824  - Progressing by Montel Culver, RN Range of joint motion will improve 02/21/2017 1824 - Progressing by Montel Culver, RN Clinical Measurements: Postoperative complications will be avoided or minimized 02/21/2017 1824 - Progressing by Montel Culver, RN Pain Management: Pain level will decrease with appropriate interventions 02/21/2017 1824 - Progressing by Montel Culver, RN Skin Integrity: Signs of wound healing will improve 02/21/2017 1824 - Progressing by Montel Culver, RN

## 2017-02-21 NOTE — Progress Notes (Addendum)
   Patient Details Name: Colin Mccoy. MRN: [id number] DOB: [birth year]  Nurse tech called stating pt was ready to go back to bed this am.  Pt assisted with min/mod a x 2 to return to bed with overall increased difficulty and decreased quality of standing and transfer.  Returned to supine with mod a x 2.  Will continue with session this pm as tolerated.    No charge for session.    Chesley Noon 02/21/2017, 11:11 AM

## 2017-02-21 NOTE — Progress Notes (Signed)
   Subjective: 2 Days Post-Op Procedure(s) (LRB): TOTAL KNEE ARTHROPLASTY (Left) Patient reports pain as mild.  Pain improved from yesterday. Patient is well, and has had no acute complaints or problems Denies any CP, SOB, ABD pain. Plan is to go Skilled nursing facility tomorrow   Objective: Vital signs in last 24 hours: Temp:  [98.3 F (36.8 C)-101 F (38.3 C)] 99.8 F (37.7 C) (01/05 0717) Pulse Rate:  [35-76] 74 (01/05 0717) Resp:  [18] 18 (01/05 0717) BP: (108-136)/(46-89) 124/64 (01/05 0717) SpO2:  [89 %-100 %] 92 % (01/05 0717)  Intake/Output from previous day: 01/04 0701 - 01/05 0700 In: 660 [P.O.:660] Out: 1000 [Urine:1000] Intake/Output this shift: Total I/O In: -  Out: 350 [Urine:350]  Recent Labs    02/19/17 1249 02/20/17 0436  HGB 12.9* 12.2*   Recent Labs    02/19/17 1249 02/20/17 0436  WBC 10.3 6.6  RBC 4.02* 3.86*  HCT 38.7* 36.9*  PLT 190 176   Recent Labs    02/19/17 1249 02/20/17 0436  NA  --  136  K  --  3.9  CL  --  103  CO2  --  25  BUN  --  10  CREATININE 1.21 0.95  GLUCOSE  --  138*  CALCIUM  --  8.6*   No results for input(s): LABPT, INR in the last 72 hours.  EXAM General - Patient is Alert, Appropriate and Oriented Extremity - Neurovascular intact Sensation intact distally Intact pulses distally Dorsiflexion/Plantar flexion intact  - homans sign bilaterally Dressing - wound vac intact. 175 cc Drainage in wound vac Motor Function - intact, moving foot and toes well on exam.   Past Medical History:  Diagnosis Date  . Arthritis   . BPH (benign prostatic hyperplasia)   . Cancer ([place]) 2015   Prostate CA  . Cancer [facility]) 1996   left Kidney  . GERD (gastroesophageal reflux disease)   . High cholesterol   . History of kidney stones    left kidney several times  . Hypertension   . Pre-diabetes   . Sleep apnea   . Vitamin D deficiency     Assessment/Plan:   2 Days Post-Op Procedure(s) (LRB): TOTAL KNEE  ARTHROPLASTY (Left) Active Problems:   Primary localized osteoarthritis of left knee  Estimated body mass index is 51.1 kg/m as calculated from the following:   Height as of this encounter: 5\' 9"  (1.753 m).   Weight as of this encounter: 346 lb (156.9 kg). Advance diet Up with therapy  Patient with low-grade fever yesterday, improving today.  Encourage incentive spirometry.  No cough, chest pain, urinary symptoms, warmth erythema or drainage around incision site. Continue with bowel regimen TED hose BLE, remove at night time Plan on discharge to skilled nursing facility tomorrow.  DVT Prophylaxis - Lovenox, Foot Pumps and TED hose Weight-Bearing as tolerated to left leg   T. Rachelle Hora, PA-C [place] 02/21/2017, 8:16 AM

## 2017-02-21 NOTE — Progress Notes (Signed)
Physical Therapy Treatment Patient Details Name: Colin CONOVER Sr. MRN: [id number] DOB: [birth year] Today's Date: 02/21/2017    History of Present Illness Pt. is a 67 y.o. Male who was admitted to [facility] for a left TKR. Pt. PMHx includes: kidney CA, Prostate CA, GERD, Athritis, High Cholesterol, HTN, DM, Sleep Apnea, Vitamin D Deficiency.    PT Comments    Pt sitting edge of bed with nursing for bathing.  Assisted with standing edge of bed min a x 2 with increased time and attempts.  Pt was then able to continue to ambulate 150' x 1 with walker and step to pattern with heavy reliance on walker for support.  Participated in exercises as described below.  ROM measurements deferred to PM session as pt was in recliner after gait and unable to measure accurately.     Follow Up Recommendations  SNF     Equipment Recommendations  None recommended by PT    Recommendations for Other Services       Precautions / Restrictions Precautions Precautions: Fall;Knee Precaution Booklet Issued: No Restrictions Weight Bearing Restrictions: Yes LLE Weight Bearing: Weight bearing as tolerated    Mobility  Bed Mobility               General bed mobility comments: sittign EOB upon arrival for bathing with nursing  Transfers Overall transfer level: Needs assistance Equipment used: Rolling walker (2 wheeled) Transfers: Sit to/from Stand Sit to Stand: Min assist;+2 physical assistance         General transfer comment: Assist to boost to standing and to control descent to sit.  Pt with poor eccentric lowering.   Ambulation/Gait Ambulation/Gait assistance: +2 safety/equipment;Min guard Ambulation Distance (Feet): 150 Feet Assistive device: Rolling walker (2 wheeled) Gait Pattern/deviations: Step-to pattern;Decreased step length - right;Decreased step length - left   Gait velocity interpretation: Below normal speed for age/gender     Stairs            Wheelchair Mobility     Modified Rankin (Stroke Patients Only)       Balance Overall balance assessment: Needs assistance Sitting-balance support: No upper extremity supported;Feet supported Sitting balance-Leahy Scale: Fair     Standing balance support: Bilateral upper extremity supported;During functional activity Standing balance-Leahy Scale: Poor Standing balance comment: Pt relies on UE support for static standing                            Cognition Arousal/Alertness: Awake/alert Behavior During Therapy: WFL for tasks assessed/performed Overall Cognitive Status: Within Functional Limits for tasks assessed                                        Exercises Total Joint Exercises Ankle Circles/Pumps: AROM;Both;10 reps;Supine Long Arc Quad: AAROM;Right;10 reps;Seated Goniometric ROM:  will measure this pm as pt was sitting EOB upon arrival and knee flexion difficult to measure in chair after gait Marching in Standing: AROM;5 reps;Standing;Left    General Comments        Pertinent Vitals/Pain Pain Assessment: 0-10 Pain Score: 5  Pain Location: Left Knee Pain Descriptors / Indicators: Aching;Grimacing;Guarding;Moaning Pain Intervention(s): Limited activity within patient's tolerance;Monitored during session    Home Living                      Prior Function  PT Goals (current goals can now be found in the care plan section) Progress towards PT goals: Progressing toward goals    Frequency    BID      PT Plan Current plan remains appropriate    Co-evaluation              AM-PAC PT "6 Clicks" Daily Activity  Outcome Measure  Difficulty turning over in bed (including adjusting bedclothes, sheets and blankets)?: Unable Difficulty moving from lying on back to sitting on the side of the bed? : Unable Difficulty sitting down on and standing up from a chair with arms (e.g., wheelchair, bedside commode, etc,.)?: Unable Help needed  moving to and from a bed to chair (including a wheelchair)?: A Little Help needed walking in hospital room?: A Little Help needed climbing 3-5 steps with a railing? : Total 6 Click Score: 10    End of Session Equipment Utilized During Treatment: Gait belt Activity Tolerance: Patient tolerated treatment well;Patient limited by fatigue Patient left: in chair;with chair alarm set;with call bell/phone within reach Nurse Communication: Mobility status Pain - Right/Left: Left Pain - part of body: Knee     Time: 9937-1696 PT Time Calculation (min) (ACUTE ONLY): 27 min  Charges:  $Gait Training: 8-22 mins $Therapeutic Exercise: 8-22 mins                    G Codes:       Chesley Noon, PTA 02/21/17, 10:17 AM

## 2017-02-21 NOTE — Progress Notes (Signed)
Physical Therapy Treatment Patient Details Name: Colin CAUTHORN Sr. MRN: [id number] DOB: 03-23-1950 Today's Date: 02/21/2017    History of Present Illness Pt. is a 67 y.o. Male who was admitted to [facility] for a left TKR. Pt. PMHx includes: kidney CA, Prostate CA, GERD, Athritis, High Cholesterol, HTN, DM, Sleep Apnea, Vitamin D Deficiency.    PT Comments    Pt attempting to reposition in bed upon arrival "I don't need to do this again do I?"  Pt agreed to exercised but stated he was unable to walk at this time due to pain and generally not feeling well.  To edge of bed with Min assist.  Participated in exercises as described below.  Returned to supine and positioned to comfort.  Refused Bone Foam and towels were used to position LE in proper position.  Pt rolls leg out in ER.  Educated on positioning and he verbalized understanding.   Follow Up Recommendations  SNF     Equipment Recommendations  None recommended by PT    Recommendations for Other Services       Precautions / Restrictions Precautions Precautions: Fall;Knee Precaution Booklet Issued: No Precaution Comments: Instructed pt in no pillow under knee Restrictions Weight Bearing Restrictions: Yes LLE Weight Bearing: Weight bearing as tolerated    Mobility  Bed Mobility Overal bed mobility: Needs Assistance Bed Mobility: Supine to Sit;Sit to Supine     Supine to sit: Min assist Sit to supine: Mod assist   General bed mobility comments: sittign EOB upon arrival for bathing with nursing  Transfers Overall transfer level: Needs assistance Equipment used: Rolling walker (2 wheeled) Transfers: Sit to/from Stand Sit to Stand: Min assist;+2 physical assistance         General transfer comment: Assist to boost to standing and to control descent to sit.  Pt with poor eccentric lowering.   Ambulation/Gait Ambulation/Gait assistance: +2 safety/equipment;Min guard Ambulation Distance (Feet): 150 Feet Assistive device:  Rolling walker (2 wheeled) Gait Pattern/deviations: Step-to pattern;Decreased step length - right;Decreased step length - left   Gait velocity interpretation: Below normal speed for age/gender General Gait Details: deferred - unable due to pain   Stairs            Wheelchair Mobility    Modified Rankin (Stroke Patients Only)       Balance Overall balance assessment: Needs assistance Sitting-balance support: No upper extremity supported;Feet supported Sitting balance-Leahy Scale: Fair     Standing balance support: Bilateral upper extremity supported;During functional activity Standing balance-Leahy Scale: Poor Standing balance comment: Pt relies on UE support for static standing                            Cognition Arousal/Alertness: Awake/alert Behavior During Therapy: WFL for tasks assessed/performed Overall Cognitive Status: Within Functional Limits for tasks assessed                                        Exercises Total Joint Exercises Ankle Circles/Pumps: AROM;Both;10 reps;Supine Quad Sets: Strengthening;Both;10 reps;Supine Heel Slides: Strengthening;Left;Supine;AROM;5 reps Straight Leg Raises: Strengthening;Left;AAROM;Supine;10 reps Long Arc Quad: AAROM;Right;10 reps;Seated Knee Flexion: AAROM;Left;5 reps;Seated;Other (comment) Goniometric ROM: 8-81 limited by pain Marching in Standing: AROM;5 reps;Standing;Left    General Comments        Pertinent Vitals/Pain Pain Assessment: 0-10 Pain Score: 7  Pain Location: Left Knee Pain Descriptors / Indicators:  Aching;Grimacing;Guarding;Moaning Pain Intervention(s): RN gave pain meds during session;Limited activity within patient's tolerance;Monitored during session    Home Living                      Prior Function            PT Goals (current goals can now be found in the care plan section) Progress towards PT goals: Progressing toward goals    Frequency     BID      PT Plan Current plan remains appropriate    Co-evaluation              AM-PAC PT "6 Clicks" Daily Activity  Outcome Measure  Difficulty turning over in bed (including adjusting bedclothes, sheets and blankets)?: Unable Difficulty moving from lying on back to sitting on the side of the bed? : Unable Difficulty sitting down on and standing up from a chair with arms (e.g., wheelchair, bedside commode, etc,.)?: Unable Help needed moving to and from a bed to chair (including a wheelchair)?: A Little Help needed walking in hospital room?: A Little Help needed climbing 3-5 steps with a railing? : Total 6 Click Score: 10    End of Session Equipment Utilized During Treatment: Gait belt Activity Tolerance: Patient limited by pain Patient left: in bed;with bed alarm set;with call bell/phone within reach;with family/visitor present;with nursing/sitter in room Nurse Communication: Mobility status Pain - Right/Left: Left Pain - part of body: Knee     Time: 1245-1305 PT Time Calculation (min) (ACUTE ONLY): 20 min  Charges:  $Gait Training: 8-22 mins $Therapeutic Exercise: 8-22 mins                    G Codes:       Chesley Noon, PTA 02/21/17, 1:07 PM

## 2017-02-21 NOTE — Progress Notes (Signed)
OT Cancellation Note  Patient Details Name: Colin DARROCH Sr. MRN: [id number] DOB: 05/27/1950   Cancelled Treatment:    Reason Treat Not Completed: Other (comment) Pt report that his wife used AE before when she had TKR and with his knee hurting he do not want to practice adaptive equipment - he wants to wait until he gets to SNF for Rehab to practice   Rosalyn Gess OTR/L,CLT 02/21/2017, 11:32 AM

## 2017-02-22 NOTE — Progress Notes (Signed)
   Subjective: 3 Days Post-Op Procedure(s) (LRB): TOTAL KNEE ARTHROPLASTY (Left) Patient reports pain as mild. Patient is well, and has had no acute complaints or problems Denies any CP, SOB, ABD pain. Plan is to go Skilled nursing facility today   Objective: Vital signs in last 24 hours: Temp:  [98.9 F (37.2 C)-99.8 F (37.7 C)] 99.5 F (37.5 C) (01/06 0753) Pulse Rate:  [71-80] 75 (01/06 0753) Resp:  [17-20] 17 (01/06 0753) BP: (97-128)/(54-62) 118/58 (01/06 0753) SpO2:  [91 %-98 %] 97 % (01/06 0753)  Intake/Output from previous day: 01/05 0701 - 01/06 0700 In: 840 [P.O.:840] Out: 1055 [Urine:1055] Intake/Output this shift: No intake/output data recorded.  Recent Labs    02/19/17 1249 02/20/17 0436  HGB 12.9* 12.2*   Recent Labs    02/19/17 1249 02/20/17 0436  WBC 10.3 6.6  RBC 4.02* 3.86*  HCT 38.7* 36.9*  PLT 190 176   Recent Labs    02/19/17 1249 02/20/17 0436  NA  --  136  K  --  3.9  CL  --  103  CO2  --  25  BUN  --  10  CREATININE 1.21 0.95  GLUCOSE  --  138*  CALCIUM  --  8.6*   No results for input(s): LABPT, INR in the last 72 hours.  EXAM General - Patient is Alert, Appropriate and Oriented Extremity - Neurovascular intact Sensation intact distally Intact pulses distally Dorsiflexion/Plantar flexion intact  - homans sign bilaterally Dressing - wound vac intact. 250 cc Drainage in wound vac Motor Function - intact, moving foot and toes well on exam.   Past Medical History:  Diagnosis Date  . Arthritis   . BPH (benign prostatic hyperplasia)   . Cancer ([place]) 2015   Prostate CA  . Cancer [facility]) 1996   left Kidney  . GERD (gastroesophageal reflux disease)   . High cholesterol   . History of kidney stones    left kidney several times  . Hypertension   . Pre-diabetes   . Sleep apnea   . Vitamin D deficiency     Assessment/Plan:   3 Days Post-Op Procedure(s) (LRB): TOTAL KNEE ARTHROPLASTY (Left) Active Problems:   Primary  localized osteoarthritis of left knee  Estimated body mass index is 51.1 kg/m as calculated from the following:   Height as of this encounter: 5\' 9"  (1.753 m).   Weight as of this encounter: 346 lb (156.9 kg). Advance diet Up with therapy  Fevers resolved. VS WNL Discharge to skilled nursing facility today pending bowel movement TED hose BLE, remove at night time Follow-up with [facility] clinic in 2 weeks for staple removal and Steri-Strip application Remove wound VAC and apply honeycomb dressing on 03/01/2017.  Change drainage canister as needed.  DVT Prophylaxis - Lovenox, Foot Pumps and TED hose Weight-Bearing as tolerated to left leg   T. Rachelle Hora, PA-C [place] 02/22/2017, 8:55 AM

## 2017-02-22 NOTE — Progress Notes (Signed)
Patients wife called and updated.

## 2017-02-22 NOTE — Discharge Instructions (Signed)

## 2017-02-22 NOTE — Clinical Social Work Note (Signed)
Patient will discharge to [facility] via non-emergent EMS to room 612, report to be called to [place] at [phone]. The patient's spouse and the facility are aware and in agreement. The facility has received the discharge summary and FL 2. CSW will deliver packet as soon as possible. The patient's wife would like to be called by the RN when EMS arrives so that she can meet him at the facility. CSW is signing off. Please consult should additional needs arise.  Santiago Bumpers, MSW, Latanya Presser [phone]

## 2017-02-22 NOTE — Progress Notes (Signed)
Patient a&o, vss. Resting in bed. Wound vac in place, dressing dry and intact. Teds and foot pumps on bilateral legs. No complaints at this time. Discharging pending BM. No complaints at this time. Continue to monitor.

## 2017-02-22 NOTE — Discharge Summary (Signed)
Physician Discharge Summary  Patient ID: Colin VANBENSCHOTEN Sr. MRN: [id number] DOB/AGE: Feb 05, 1951 67 y.o.  Admit date: 02/19/2017 Discharge date: 02/22/2017  Admission Diagnoses:  PRIMARY OSTEOARTHRITIS OF LEFT KNEE   Discharge Diagnoses: Patient Active Problem List   Diagnosis Date Noted  . Primary localized osteoarthritis of left knee 02/19/2017  . Hyperlipidemia 11/18/2016  . Essential hypertension 11/18/2016  . Bilateral lower extremity edema 11/18/2016  . Absent pedal pulses 11/18/2016  . Vertigo 01/30/2015    Past Medical History:  Diagnosis Date  . Arthritis   . BPH (benign prostatic hyperplasia)   . Cancer ([place]) 2015   Prostate CA  . Cancer [facility]) 1996   left Kidney  . GERD (gastroesophageal reflux disease)   . High cholesterol   . History of kidney stones    left kidney several times  . Hypertension   . Pre-diabetes   . Sleep apnea   . Vitamin D deficiency      Transfusion: None   Consultants (if any):   Discharged Condition: Improved  Hospital Course: CHESLEY VEASEY Sr. is an 67 y.o. male who was admitted 02/19/2017 with a diagnosis of severe left knee osteoarthritis and went to the operating room on 02/19/2017 and underwent the above named procedures.    Surgeries: Procedure(s): TOTAL KNEE ARTHROPLASTY on 02/19/2017 Patient tolerated the surgery well. Taken to PACU where she was stabilized and then transferred to the orthopedic floor.  Started on Lovenox 30 mg q 12 hrs. Foot pumps applied bilaterally at 80 mm. Heels elevated on bed with rolled towels. No evidence of DVT. Negative Homan. Physical therapy started on day #1 for gait training and transfer. OT started day #1 for ADL and assisted devices.  Patient's foley was d/c on day #1. Patient's IV was d/c on day #2.  On post op day #3 patient was stable and ready for discharge to skilled nursing facility.  Instructions for skilled nursing facility: TED hose BLE, remove at night time Follow-up with [facility] clinic  in 2 weeks for staple removal and Steri-Strip application Remove wound VAC and apply honeycomb dressing on 03/01/2017.  Change drainage canister as needed.  He was given perioperative antibiotics:  Anti-infectives (From admission, onward)   Start     Dose/Rate Route Frequency Ordered Stop   02/19/17 1130  ceFAZolin (ANCEF) 2 g in dextrose 5 % 100 mL IVPB     2 g 240 mL/hr over 30 Minutes Intravenous Every 6 hours 02/19/17 1129 02/20/17 0452   02/18/17 2200  ceFAZolin (ANCEF) 3 g in dextrose 5 % 50 mL IVPB     3 g 130 mL/hr over 30 Minutes Intravenous  Once 02/18/17 2145 02/19/17 0833    .  He was given sequential compression devices, early ambulation, and Lovenox 40 mg daily times 14 days for DVT prophylaxis.  He benefited maximally from the hospital stay and there were no complications.    Recent vital signs:  Vitals:   02/22/17 0446 02/22/17 0753  BP: 111/62 (!) 118/58  Pulse: 71 75  Resp: 18 17  Temp: 99.1 F (37.3 C) 99.5 F (37.5 C)  SpO2: 98% 97%    Recent laboratory studies:  Lab Results  Component Value Date   HGB 12.2 (L) 02/20/2017   HGB 12.9 (L) 02/19/2017   HGB 12.8 (L) 02/04/2017   Lab Results  Component Value Date   WBC 6.6 02/20/2017   PLT 176 02/20/2017   Lab Results  Component Value Date   INR 1.17 02/04/2017  Lab Results  Component Value Date   NA 136 02/20/2017   K 3.9 02/20/2017   CL 103 02/20/2017   CO2 25 02/20/2017   BUN 10 02/20/2017   CREATININE 0.95 02/20/2017   GLUCOSE 138 (H) 02/20/2017    Discharge Medications:   Allergies as of 02/22/2017   No Known Allergies     Medication List    STOP taking these medications   meloxicam 15 MG tablet Commonly known as:  MOBIC   traMADol 50 MG tablet Commonly known as:  ULTRAM     TAKE these medications   acetaminophen 500 MG tablet Commonly known as:  TYLENOL Take 1,000 mg by mouth daily.   amLODipine 10 MG tablet Commonly known as:  NORVASC Take 10 mg by mouth daily. At  bedtime.   aspirin EC 81 MG tablet Take 81 mg by mouth daily.   atorvastatin 80 MG tablet Commonly known as:  LIPITOR Take 80 mg by mouth at bedtime.   benazepril-hydrochlorthiazide 20-25 MG tablet Commonly known as:  LOTENSIN HCT Take 1 tablet by mouth daily.   docusate sodium 100 MG capsule Commonly known as:  COLACE Take 200 mg by mouth daily.   enoxaparin 40 MG/0.4ML injection Commonly known as:  LOVENOX Inject 0.4 mLs (40 mg total) into the skin daily for 14 days.   fluticasone 50 MCG/ACT nasal spray Commonly known as:  FLONASE Place 1 spray into both nostrils daily as needed for allergies.   furosemide 20 MG tablet Commonly known as:  LASIX Take 20 mg by mouth daily. In  Am.   loratadine 10 MG tablet Commonly known as:  CLARITIN Take 1 tablet (10 mg total) by mouth daily. What changed:    when to take this  reasons to take this   losartan 100 MG tablet Commonly known as:  COZAAR Take 100 mg by mouth daily. In am.   meclizine 25 MG tablet Commonly known as:  ANTIVERT Take 1 tablet (25 mg total) by mouth 2 (two) times daily as needed for dizziness.   nitroGLYCERIN 0.4 MG SL tablet Commonly known as:  NITROSTAT Place 0.4 mg under the tongue every 5 (five) minutes as needed for chest pain.   omeprazole 40 MG capsule Commonly known as:  PRILOSEC Take 40 mg by mouth daily.   oxyCODONE 5 MG immediate release tablet Commonly known as:  Oxy IR/ROXICODONE Take 1-2 tablets (5-10 mg total) by mouth every 4 (four) hours as needed for moderate pain ((score 4 to 6)).   polyethylene glycol packet Commonly known as:  MIRALAX / GLYCOLAX Take 17 g by mouth daily.   tamsulosin 0.4 MG Caps capsule Commonly known as:  FLOMAX Take 0.4 mg by mouth daily.            Durable Medical Equipment  (From admission, onward)        Start     Ordered   02/19/17 1130  DME Walker rolling  Once    Question:  Patient needs a walker to treat with the following condition   Answer:  Status post total left knee replacement   02/19/17 1129   02/19/17 1130  DME 3 n 1  Once     02/19/17 1129   02/19/17 1130  DME Bedside commode  Once    Question:  Patient needs a bedside commode to treat with the following condition  Answer:  Status post total left knee replacement using cement   02/19/17 1129      Diagnostic  Studies: Dg Knee 1-2 Views Left  Result Date: 02/19/2017 CLINICAL DATA:  Status post left total hip joint prosthesis placement. EXAM: LEFT KNEE - 1-2 VIEW COMPARISON:  No recent studies in [facility] FINDINGS: The patient has undergone total knee joint prosthesis placement on the left. Radiographic positioning of the prosthetic components is good. The interface with the native bone appears normal. A surgical cutaneous wound VAC as well as skin staples are present. IMPRESSION: No immediate postprocedure complication following left total knee joint prosthesis placement. Electronically Signed   By: David  Martinique M.D.   On: 02/19/2017 10:37    Disposition: 01-Home or Self Care     Contact information for follow-up providers    Hessie Knows, MD Follow up in 2 week(s).   Specialty:  Orthopedic Surgery Contact information: [place] [address] [phone]            Contact information for after-discharge care    Destination    [facility] Follow up.   Service:  Skilled Nursing Contact information: [address] [place] [phone]                   Signed: Feliberto Gottron 02/22/2017, 8:59 AM

## 2017-02-22 NOTE — Progress Notes (Signed)
Patient had BM. EMS called. [facility] facility called and notified.

## 2017-02-22 NOTE — Progress Notes (Signed)
Report called to Belinda at [facility]. Waiting on patient to have a BM.

## 2017-04-06 MED ORDER — DEXTROSE 5 % IV SOLN
3.0000 g | Freq: Once | INTRAVENOUS | Status: AC
  Administered 2017-04-07: 3 g via INTRAVENOUS
  Filled 2017-04-06: qty 3

## 2017-04-07 ENCOUNTER — Other Ambulatory Visit: Payer: Self-pay

## 2017-04-07 ENCOUNTER — Encounter
Admission: RE | Disposition: A | Discharge status: Home / Self Care | Payer: Self-pay | Source: Ambulatory Visit | Attending: Orthopedic Surgery

## 2017-04-07 ENCOUNTER — Ambulatory Visit: Payer: Medicare Other | Admitting: Anesthesiology

## 2017-04-07 ENCOUNTER — Ambulatory Visit
Admission: RE | Admit: 2017-04-07 | Discharge: 2017-04-08 | Disposition: A | Discharge status: Home / Self Care | Payer: Medicare Other | Source: Ambulatory Visit | Attending: Orthopedic Surgery | Admitting: Orthopedic Surgery

## 2017-04-07 ENCOUNTER — Encounter: Payer: Self-pay | Admitting: Emergency Medicine

## 2017-04-07 DIAGNOSIS — S83015A Lateral dislocation of left patella, initial encounter: Secondary | ICD-10-CM | POA: Insufficient documentation

## 2017-04-07 DIAGNOSIS — R7303 Prediabetes: Secondary | ICD-10-CM | POA: Diagnosis not present

## 2017-04-07 DIAGNOSIS — E559 Vitamin D deficiency, unspecified: Secondary | ICD-10-CM | POA: Insufficient documentation

## 2017-04-07 DIAGNOSIS — Z79899 Other long term (current) drug therapy: Secondary | ICD-10-CM | POA: Insufficient documentation

## 2017-04-07 DIAGNOSIS — Z7982 Long term (current) use of aspirin: Secondary | ICD-10-CM | POA: Diagnosis not present

## 2017-04-07 DIAGNOSIS — E7800 Pure hypercholesterolemia, unspecified: Secondary | ICD-10-CM | POA: Insufficient documentation

## 2017-04-07 DIAGNOSIS — X58XXXA Exposure to other specified factors, initial encounter: Secondary | ICD-10-CM | POA: Diagnosis not present

## 2017-04-07 DIAGNOSIS — I10 Essential (primary) hypertension: Secondary | ICD-10-CM | POA: Diagnosis not present

## 2017-04-07 DIAGNOSIS — G4730 Sleep apnea, unspecified: Secondary | ICD-10-CM | POA: Diagnosis not present

## 2017-04-07 DIAGNOSIS — Y929 Unspecified place or not applicable: Secondary | ICD-10-CM | POA: Insufficient documentation

## 2017-04-07 DIAGNOSIS — M1712 Unilateral primary osteoarthritis, left knee: Secondary | ICD-10-CM | POA: Diagnosis not present

## 2017-04-07 DIAGNOSIS — Z8546 Personal history of malignant neoplasm of prostate: Secondary | ICD-10-CM | POA: Insufficient documentation

## 2017-04-07 DIAGNOSIS — Z96652 Presence of left artificial knee joint: Secondary | ICD-10-CM | POA: Diagnosis not present

## 2017-04-07 DIAGNOSIS — N400 Enlarged prostate without lower urinary tract symptoms: Secondary | ICD-10-CM | POA: Diagnosis not present

## 2017-04-07 DIAGNOSIS — K219 Gastro-esophageal reflux disease without esophagitis: Secondary | ICD-10-CM | POA: Insufficient documentation

## 2017-04-07 DIAGNOSIS — S83006A Unspecified dislocation of unspecified patella, initial encounter: Secondary | ICD-10-CM | POA: Diagnosis present

## 2017-04-07 DIAGNOSIS — Z87442 Personal history of urinary calculi: Secondary | ICD-10-CM | POA: Insufficient documentation

## 2017-04-07 HISTORY — PX: TOTAL KNEE ARTHROPLASTY: SHX125

## 2017-04-07 LAB — GLUCOSE, CAPILLARY
Glucose-Capillary: 128 mg/dL — ABNORMAL HIGH (ref 65–99)
Glucose-Capillary: 123 mg/dL — ABNORMAL HIGH (ref 65–99)

## 2017-04-07 SURGERY — ARTHROPLASTY, KNEE, TOTAL
Anesthesia: General | Laterality: Left

## 2017-04-07 MED ORDER — FENTANYL CITRATE (PF) 100 MCG/2ML IJ SOLN
INTRAMUSCULAR | Status: AC
  Administered 2017-04-07: 25 ug via INTRAVENOUS
  Filled 2017-04-07: qty 2

## 2017-04-07 MED ORDER — ONDANSETRON HCL 4 MG/2ML IJ SOLN
INTRAMUSCULAR | Status: DC | PRN
  Administered 2017-04-07: 4 mg via INTRAVENOUS

## 2017-04-07 MED ORDER — ONDANSETRON HCL 4 MG PO TABS: 4.0000 mg | ORAL_TABLET | Freq: Four times a day (QID) | ORAL | Status: DC | PRN

## 2017-04-07 MED ORDER — SODIUM CHLORIDE 0.9 % IV SOLN
INTRAVENOUS | Status: DC | PRN
  Administered 2017-04-07: 60 mL

## 2017-04-07 MED ORDER — DEXAMETHASONE SODIUM PHOSPHATE 10 MG/ML IJ SOLN
INTRAMUSCULAR | Status: AC
  Filled 2017-04-07: qty 1

## 2017-04-07 MED ORDER — SUGAMMADEX SODIUM 500 MG/5ML IV SOLN
INTRAVENOUS | Status: DC | PRN
  Administered 2017-04-07: 300 mg via INTRAVENOUS

## 2017-04-07 MED ORDER — DEXTROSE 5 % IV SOLN
3.0000 g | Freq: Four times a day (QID) | INTRAVENOUS | Status: AC
  Administered 2017-04-07 – 2017-04-08 (×3): 3 g via INTRAVENOUS
  Filled 2017-04-07 (×4): qty 3000

## 2017-04-07 MED ORDER — ROCURONIUM BROMIDE 50 MG/5ML IV SOLN
INTRAVENOUS | Status: AC
  Filled 2017-04-07: qty 1

## 2017-04-07 MED ORDER — SODIUM CHLORIDE 0.9 % IV SOLN
INTRAVENOUS | Status: DC
  Administered 2017-04-07: 10:00:00 via INTRAVENOUS

## 2017-04-07 MED ORDER — ONDANSETRON HCL 4 MG/2ML IJ SOLN
INTRAMUSCULAR | Status: AC
  Filled 2017-04-07: qty 2

## 2017-04-07 MED ORDER — FENTANYL CITRATE (PF) 100 MCG/2ML IJ SOLN
INTRAMUSCULAR | Status: DC | PRN
  Administered 2017-04-07: 100 ug via INTRAVENOUS
  Administered 2017-04-07 (×2): 50 ug via INTRAVENOUS

## 2017-04-07 MED ORDER — ACETAMINOPHEN 10 MG/ML IV SOLN
INTRAVENOUS | Status: DC | PRN
  Administered 2017-04-07: 1000 mg via INTRAVENOUS

## 2017-04-07 MED ORDER — FENTANYL CITRATE (PF) 100 MCG/2ML IJ SOLN
INTRAMUSCULAR | Status: AC
  Filled 2017-04-07: qty 2

## 2017-04-07 MED ORDER — HYDROMORPHONE HCL 1 MG/ML IJ SOLN
INTRAMUSCULAR | Status: AC
  Administered 2017-04-07: 0.25 mg via INTRAVENOUS
  Filled 2017-04-07: qty 1

## 2017-04-07 MED ORDER — SUGAMMADEX SODIUM 500 MG/5ML IV SOLN
INTRAVENOUS | Status: AC
  Filled 2017-04-07: qty 5

## 2017-04-07 MED ORDER — OXYCODONE HCL 5 MG PO TABS
5.0000 mg | ORAL_TABLET | ORAL | Status: DC | PRN
  Administered 2017-04-07: 10 mg via ORAL
  Administered 2017-04-07: 5 mg via ORAL
  Administered 2017-04-08: 10 mg via ORAL
  Filled 2017-04-07: qty 2
  Filled 2017-04-07: qty 1
  Filled 2017-04-07: qty 2

## 2017-04-07 MED ORDER — NEOMYCIN-POLYMYXIN B GU 40-200000 IR SOLN
Status: DC | PRN
  Administered 2017-04-07: 16 mL

## 2017-04-07 MED ORDER — ROCURONIUM BROMIDE 100 MG/10ML IV SOLN
INTRAVENOUS | Status: DC | PRN
  Administered 2017-04-07: 10 mg via INTRAVENOUS
  Administered 2017-04-07: 20 mg via INTRAVENOUS

## 2017-04-07 MED ORDER — HYDROMORPHONE HCL 1 MG/ML IJ SOLN
0.2500 mg | INTRAMUSCULAR | Status: DC | PRN
  Administered 2017-04-07 (×7): 0.25 mg via INTRAVENOUS

## 2017-04-07 MED ORDER — DEXAMETHASONE SODIUM PHOSPHATE 10 MG/ML IJ SOLN
INTRAMUSCULAR | Status: DC | PRN
  Administered 2017-04-07: 10 mg via INTRAVENOUS

## 2017-04-07 MED ORDER — SUCCINYLCHOLINE CHLORIDE 20 MG/ML IJ SOLN
INTRAMUSCULAR | Status: DC | PRN
  Administered 2017-04-07: 140 mg via INTRAVENOUS

## 2017-04-07 MED ORDER — METHOCARBAMOL 1000 MG/10ML IJ SOLN
500.0000 mg | Freq: Four times a day (QID) | INTRAMUSCULAR | Status: DC | PRN
  Filled 2017-04-07: qty 5

## 2017-04-07 MED ORDER — MAGNESIUM CITRATE PO SOLN
1.0000 | Freq: Once | ORAL | Status: DC | PRN
Start: 2017-04-07 — End: 2017-04-08
  Filled 2017-04-07: qty 296

## 2017-04-07 MED ORDER — PROPOFOL 10 MG/ML IV BOLUS
INTRAVENOUS | Status: DC | PRN
  Administered 2017-04-07: 200 mg via INTRAVENOUS

## 2017-04-07 MED ORDER — MIDAZOLAM HCL 2 MG/2ML IJ SOLN
INTRAMUSCULAR | Status: DC | PRN
  Administered 2017-04-07: 2 mg via INTRAVENOUS

## 2017-04-07 MED ORDER — METHOCARBAMOL 500 MG PO TABS: 500.0000 mg | ORAL_TABLET | Freq: Four times a day (QID) | ORAL | Status: DC | PRN

## 2017-04-07 MED ORDER — HYDROMORPHONE HCL 1 MG/ML IJ SOLN: 1.0000 mg | INTRAMUSCULAR | Status: DC | PRN

## 2017-04-07 MED ORDER — ACETAMINOPHEN 10 MG/ML IV SOLN
INTRAVENOUS | Status: AC
  Filled 2017-04-07: qty 100

## 2017-04-07 MED ORDER — METOCLOPRAMIDE HCL 5 MG/ML IJ SOLN: 5.0000 mg | Freq: Three times a day (TID) | INTRAMUSCULAR | Status: DC | PRN

## 2017-04-07 MED ORDER — MIDAZOLAM HCL 2 MG/2ML IJ SOLN
INTRAMUSCULAR | Status: AC
  Filled 2017-04-07: qty 2

## 2017-04-07 MED ORDER — PROPOFOL 10 MG/ML IV BOLUS
INTRAVENOUS | Status: AC
  Filled 2017-04-07: qty 20

## 2017-04-07 MED ORDER — SODIUM CHLORIDE 0.9 % IV SOLN
INTRAVENOUS | Status: DC
  Administered 2017-04-07: 15:00:00 via INTRAVENOUS

## 2017-04-07 MED ORDER — HYDROCODONE-ACETAMINOPHEN 5-325 MG PO TABS: 1.0000 | ORAL_TABLET | ORAL | Status: DC | PRN

## 2017-04-07 MED ORDER — ONDANSETRON HCL 4 MG/2ML IJ SOLN: 4.0000 mg | Freq: Once | INTRAMUSCULAR | Status: DC | PRN

## 2017-04-07 MED ORDER — POLYETHYLENE GLYCOL 3350 17 G PO PACK: 17.0000 g | PACK | Freq: Every day | ORAL | Status: DC | PRN

## 2017-04-07 MED ORDER — METOCLOPRAMIDE HCL 10 MG PO TABS: 5.0000 mg | ORAL_TABLET | Freq: Three times a day (TID) | ORAL | Status: DC | PRN

## 2017-04-07 MED ORDER — FENTANYL CITRATE (PF) 100 MCG/2ML IJ SOLN
25.0000 ug | INTRAMUSCULAR | Status: AC | PRN
  Administered 2017-04-07 (×6): 25 ug via INTRAVENOUS

## 2017-04-07 MED ORDER — BISACODYL 10 MG RE SUPP: 10.0000 mg | Freq: Every day | RECTAL | Status: DC | PRN

## 2017-04-07 MED ORDER — ONDANSETRON HCL 4 MG/2ML IJ SOLN: 4.0000 mg | Freq: Four times a day (QID) | INTRAMUSCULAR | Status: DC | PRN

## 2017-04-07 MED ORDER — DOCUSATE SODIUM 100 MG PO CAPS
100.0000 mg | ORAL_CAPSULE | Freq: Two times a day (BID) | ORAL | Status: DC
  Administered 2017-04-07 – 2017-04-08 (×2): 100 mg via ORAL
  Filled 2017-04-07 (×2): qty 1

## 2017-04-07 SURGICAL SUPPLY — 55 items
BANDAGE ACE 6X5 VEL STRL LF (GAUZE/BANDAGES/DRESSINGS) ×2 IMPLANT
BLADE SAW 1 (BLADE) IMPLANT
CANISTER SUCT 1200ML W/VALVE (MISCELLANEOUS) ×2 IMPLANT
CANISTER SUCT 3000ML PPV (MISCELLANEOUS) ×4 IMPLANT
CHLORAPREP W/TINT 26ML (MISCELLANEOUS) ×4 IMPLANT
COOLER POLAR GLACIER W/PUMP (MISCELLANEOUS) IMPLANT
CUFF TOURN 24 STER (MISCELLANEOUS) IMPLANT
CUFF TOURN 30 STER DUAL PORT (MISCELLANEOUS) IMPLANT
DRAPE SHEET LG 3/4 BI-LAMINATE (DRAPES) ×4 IMPLANT
ELECT CAUTERY BLADE 6.4 (BLADE) ×2 IMPLANT
ELECT REM PT RETURN 9FT ADLT (ELECTROSURGICAL) ×2
ELECTRODE REM PT RTRN 9FT ADLT (ELECTROSURGICAL) ×1 IMPLANT
GAUZE PETRO XEROFOAM 1X8 (MISCELLANEOUS) IMPLANT
GAUZE SPONGE 4X4 12PLY STRL (GAUZE/BANDAGES/DRESSINGS) IMPLANT
GLOVE BIOGEL PI IND STRL 9 (GLOVE) ×5 IMPLANT
GLOVE BIOGEL PI INDICATOR 9 (GLOVE) ×5
GLOVE INDICATOR 8.0 STRL GRN (GLOVE) ×10 IMPLANT
GLOVE SURG ORTHO 8.0 STRL STRW (GLOVE) ×6 IMPLANT
GLOVE SURG SYN 9.0  PF PI (GLOVE) ×2
GLOVE SURG SYN 9.0 PF PI (GLOVE) ×2 IMPLANT
GOWN SRG 2XL LVL 4 RGLN SLV (GOWNS) ×1 IMPLANT
GOWN STRL NON-REIN 2XL LVL4 (GOWNS) ×1
GOWN STRL REUS W/ TWL LRG LVL3 (GOWN DISPOSABLE) ×2 IMPLANT
GOWN STRL REUS W/ TWL XL LVL3 (GOWN DISPOSABLE) ×1 IMPLANT
GOWN STRL REUS W/TWL LRG LVL3 (GOWN DISPOSABLE) ×2
GOWN STRL REUS W/TWL XL LVL3 (GOWN DISPOSABLE) ×1
HOLDER FOLEY CATH W/STRAP (MISCELLANEOUS) IMPLANT
HOOD PEEL AWAY FLYTE STAYCOOL (MISCELLANEOUS) ×4 IMPLANT
IMMBOLIZER KNEE 19 BLUE UNIV (SOFTGOODS) ×2 IMPLANT
KIT PREVENA INCISION MGT20CM45 (CANNISTER) ×2 IMPLANT
KIT TURNOVER KIT A (KITS) ×2 IMPLANT
KNIFE SCULPS 14X20 (INSTRUMENTS) IMPLANT
NDL SAFETY ECLIPSE 18X1.5 (NEEDLE) ×1 IMPLANT
NEEDLE HYPO 18GX1.5 SHARP (NEEDLE) ×1
NEEDLE SPNL 18GX3.5 QUINCKE PK (NEEDLE) IMPLANT
NEEDLE SPNL 20GX3.5 QUINCKE YW (NEEDLE) ×2 IMPLANT
NS IRRIG 1000ML POUR BTL (IV SOLUTION) ×2 IMPLANT
PACK TOTAL KNEE (MISCELLANEOUS) ×2 IMPLANT
PAD WRAPON POLAR KNEE (MISCELLANEOUS) IMPLANT
PULSAVAC PLUS IRRIG FAN TIP (DISPOSABLE) ×2
SOL .9 NS 3000ML IRR  AL (IV SOLUTION) ×1
SOL .9 NS 3000ML IRR UROMATIC (IV SOLUTION) ×1 IMPLANT
STAPLER SKIN PROX 35W (STAPLE) ×2 IMPLANT
SUCTION FRAZIER HANDLE 10FR (MISCELLANEOUS) ×2
SUCTION TUBE FRAZIER 10FR DISP (MISCELLANEOUS) ×2 IMPLANT
SUT DVC 2 QUILL PDO  T11 36X36 (SUTURE) ×1
SUT DVC 2 QUILL PDO T11 36X36 (SUTURE) ×1 IMPLANT
SUT V-LOC 90 ABS DVC 3-0 CL (SUTURE) ×2 IMPLANT
SYR 20CC LL (SYRINGE) ×2 IMPLANT
SYR 50ML LL SCALE MARK (SYRINGE) ×4 IMPLANT
TIP FAN IRRIG PULSAVAC PLUS (DISPOSABLE) ×1 IMPLANT
TOWEL OR 17X26 4PK STRL BLUE (TOWEL DISPOSABLE) ×2 IMPLANT
TOWER CARTRIDGE SMART MIX (DISPOSABLE) IMPLANT
TRAY FOLEY W/METER SILVER 16FR (SET/KITS/TRAYS/PACK) IMPLANT
WRAPON POLAR PAD KNEE (MISCELLANEOUS)

## 2017-04-07 NOTE — Op Note (Signed)
04/07/2017  11:22 AM  PATIENT:  Colin Rote Sr.  67 y.o. male  PRE-OPERATIVE DIAGNOSIS:  lateral dislocation left patella  POST-OPERATIVE DIAGNOSIS:  lateral dislocation left patella  PROCEDURE:  Procedure(s): MEDIAL RETINACULAR REPAIR (Left)  SURGEON: Laurene Footman, MD  ASSISTANTS: Rachelle Hora PA-C  ANESTHESIA:   general  EBL:  No intake/output data recorded.  BLOOD ADMINISTERED:none  DRAINS: none   LOCAL MEDICATIONS USED:  OTHER exparel  SPECIMEN:  No Specimen  DISPOSITION OF SPECIMEN:  N/A  COUNTS:  YES  TOURNIQUET:   Total Tourniquet Time Documented: Thigh (Left) - 43 minutes Total: Thigh (Left) - 43 minutes   IMPLANTS: None  DICTATION: .Dragon Dictation she brought the operating room and after adequate spinal anesthesia was obtained the left leg was prepped and draped in sterile fashion with tourniquet by the upper thigh.  After patient identification and timeout procedures were completed the prior incision was elliptically excised with tourniquet raised.  The medial and lateral subcutaneous layers were developed and the retinaculum had not ruptured but it was attenuated medially with the patella with the knee in extension partially subluxed but not dislocated.  The medial retinaculum was then incised and scar tissue excised as it was quite thick with extensive scarring with partial synovectomy completed with sharp dissection lateral release was carried out with a complete lateral release getting entirely through the capsule following this drill holes were made in the patella in oblique fashion to avoid the patellar implant and 5 Ethibond sutures were passed through the bone and then woven through the medial retinaculum for a pants over vest type closure to tighten the medial retinaculum for the sutures were placed and the patella tracked better with flexion up to 100 degrees the arthrotomy was then repaired using a running Quill have equal suture followed by  subcutaneous 3-0 v-loc closure and skin staples followed by incisional wound VAC with Ace wrap and knee immobilizer  PLAN OF CARE: Admit for overnight observation  PATIENT DISPOSITION:  PACU - hemodynamically stable.

## 2017-04-07 NOTE — Anesthesia Postprocedure Evaluation (Signed)
Anesthesia Post Note  Patient: Colin MANERS Sr.  Procedure(s) Performed: MEDIAL RETINACULAR REPAIR (Left )  Patient location during evaluation: PACU Anesthesia Type: General Level of consciousness: awake and alert and oriented Pain management: pain level controlled Vital Signs Assessment: post-procedure vital signs reviewed and stable Respiratory status: spontaneous breathing Cardiovascular status: blood pressure returned to baseline Anesthetic complications: no     Last Vitals:  Vitals:   04/07/17 1456 04/07/17 1608  BP: 122/70 115/61  Pulse: 73 73  Resp:    Temp: 36.7 C 36.7 C  SpO2: 98% 100%    Last Pain:  Vitals:   04/07/17 1608  TempSrc: Oral  PainSc:                  Colin Mccoy

## 2017-04-07 NOTE — Anesthesia Procedure Notes (Signed)
Procedure Name: Intubation Date/Time: 04/07/2017 10:05 AM Performed by: Jonna Clark, CRNA Pre-anesthesia Checklist: Patient identified, Patient being monitored, Timeout performed, Emergency Drugs available and Suction available Patient Re-evaluated:Patient Re-evaluated prior to induction Oxygen Delivery Method: Circle system utilized Preoxygenation: Pre-oxygenation with 100% oxygen Induction Type: IV induction Ventilation: Mask ventilation without difficulty Laryngoscope Size: McGraph and 4 Grade View: Grade I Tube type: Oral Tube size: 7.5 mm Number of attempts: 1 Airway Equipment and Method: Stylet Placement Confirmation: ETT inserted through vocal cords under direct vision,  positive ETCO2 and breath sounds checked- equal and bilateral Secured at: 21 cm Tube secured with: Tape Dental Injury: Teeth and Oropharynx as per pre-operative assessment

## 2017-04-07 NOTE — Progress Notes (Signed)
Pt arrived to room 159 from PACU. Skin assessment completed with Velna Hatchet, RN. IV infusing NS. Pt on room air. Pt complains of left knee pain. PRN pain medication given. Family at bedside. Pt oriented to unit. Call bell and phone within reach. Bed alarms on.

## 2017-04-07 NOTE — Anesthesia Post-op Follow-up Note (Signed)
Anesthesia QCDR form completed.        

## 2017-04-07 NOTE — Anesthesia Preprocedure Evaluation (Signed)
Anesthesia Evaluation  Patient identified by MRN, date of birth, ID band Patient awake    Reviewed: Allergy & Precautions, NPO status , Patient's Chart, lab work & pertinent test results  History of Anesthesia Complications Negative for: history of anesthetic complications  Airway Mallampati: III       Dental  (+) Partial Upper, Loose,    Pulmonary sleep apnea and Continuous Positive Airway Pressure Ventilation , neg COPD,           Cardiovascular hypertension, Pt. on medications (-) Past MI and (-) CHF (-) dysrhythmias (-) Valvular Problems/Murmurs     Neuro/Psych neg Seizures    GI/Hepatic Neg liver ROS, GERD  Medicated and Poorly Controlled,  Endo/Other  diabetes ("pre-diabetes")  Renal/GU negative Renal ROS     Musculoskeletal  (+) Arthritis ,   Abdominal   Peds negative pediatric ROS (+)  Hematology negative hematology ROS (+)   Anesthesia Other Findings   Reproductive/Obstetrics                             Anesthesia Physical  Anesthesia Plan  ASA: III  Anesthesia Plan: General   Post-op Pain Management:    Induction: Intravenous, Rapid sequence and Cricoid pressure planned  PONV Risk Score and Plan:   Airway Management Planned:   Additional Equipment:   Intra-op Plan:   Post-operative Plan: Extubation in OR  Informed Consent: I have reviewed the patients History and Physical, chart, labs and discussed the procedure including the risks, benefits and alternatives for the proposed anesthesia with the patient or authorized representative who has indicated his/her understanding and acceptance.     Plan Discussed with: CRNA and Surgeon  Anesthesia Plan Comments: (Patient refuses regional block.  He understands and accepts the risks of GOT)        Anesthesia Quick Evaluation

## 2017-04-07 NOTE — Transfer of Care (Signed)
Immediate Anesthesia Transfer of Care Note  Patient: Colin DIA Sr.  Procedure(s) Performed: MEDIAL RETINACULAR REPAIR (Left )  Patient Location: PACU  Anesthesia Type:General  Level of Consciousness: awake, alert  and oriented  Airway & Oxygen Therapy: Patient Spontanous Breathing and Patient connected to face mask oxygen  Post-op Assessment: Report given to RN and Post -op Vital signs reviewed and stable  Post vital signs: Reviewed and stable  Last Vitals:  Vitals:   04/07/17 0754  BP: 130/74  Pulse: 80  Resp: 17  Temp: 36.6 C  SpO2: 100%    Last Pain:  Vitals:   04/07/17 0754  TempSrc: Tympanic  PainSc: 0-No pain         Complications: No apparent anesthesia complications

## 2017-04-07 NOTE — H&P (Signed)
Reviewed paper H+P, will be scanned into chart. No changes noted.  

## 2017-04-08 DIAGNOSIS — S83015A Lateral dislocation of left patella, initial encounter: Secondary | ICD-10-CM | POA: Diagnosis not present

## 2017-04-08 MED ORDER — ASPIRIN EC 325 MG PO TBEC
325.0000 mg | DELAYED_RELEASE_TABLET | Freq: Two times a day (BID) | ORAL | Status: DC
  Administered 2017-04-08: 325 mg via ORAL
  Filled 2017-04-08: qty 1

## 2017-04-08 MED ORDER — OXYCODONE HCL 5 MG PO TABS: 5.0000 mg | ORAL_TABLET | ORAL | 0 refills | Status: DC | PRN

## 2017-04-08 MED ORDER — ASPIRIN 325 MG PO TBEC: 325.0000 mg | DELAYED_RELEASE_TABLET | Freq: Two times a day (BID) | ORAL | 0 refills | Status: DC

## 2017-04-08 NOTE — Progress Notes (Signed)
   Subjective: 1 Day Post-Op Procedure(s) (LRB): MEDIAL RETINACULAR REPAIR (Left) Patient reports pain as mild.   Patient is well, and has had no acute complaints or problems Denies any CP, SOB, ABD pain. Plan is to go Home today  Objective: Vital signs in last 24 hours: Temp:  [97.5 F (36.4 C)-99 F (37.2 C)] 98.4 F (36.9 C) (02/20 0724) Pulse Rate:  [63-86] 71 (02/20 0724) Resp:  [11-20] 18 (02/20 0724) BP: (107-131)/(50-70) 123/69 (02/20 0724) SpO2:  [95 %-100 %] 97 % (02/20 0724)  Intake/Output from previous day: 02/19 0701 - 02/20 0700 In: 1642.5 [I.V.:1492.5; IV Piggyback:150] Out: 425 [Urine:400; Blood:25] Intake/Output this shift: Total I/O In: 0  Out: 500 [Urine:500]  No results for input(s): HGB in the last 72 hours. No results for input(s): WBC, RBC, HCT, PLT in the last 72 hours. No results for input(s): NA, K, CL, CO2, BUN, CREATININE, GLUCOSE, CALCIUM in the last 72 hours. No results for input(s): LABPT, INR in the last 72 hours.  EXAM General - Patient is Alert, Appropriate and Oriented Extremity - Neurovascular intact Sensation intact distally Intact pulses distally  Calf soft. Dressing - dressing C/D/I. Wound vac intact with out drainage. Knee immobilizer intact Motor Function - intact, moving foot and toes well on exam.   Past Medical History:  Diagnosis Date  . Arthritis   . BPH (benign prostatic hyperplasia)   . Cancer ([place]) 2015   Prostate CA  . Cancer [facility]) 1996   left Kidney  . GERD (gastroesophageal reflux disease)   . High cholesterol   . History of kidney stones    left kidney several times  . Hypertension   . Pre-diabetes   . Sleep apnea   . Vitamin D deficiency     Assessment/Plan:   1 Day Post-Op Procedure(s) (LRB): MEDIAL RETINACULAR REPAIR (Left) Active Problems:   Patellar dislocation  Estimated body mass index is 49.47 kg/m as calculated from the following:   Height as of this encounter: 5\' 9"  (1.753 m).   Weight  as of this encounter: 152 kg (335 lb). Advance diet  Discharge home today WBAT LLE, NO KNEE FLEXION.  Follow up with [facility] on 04/15/17 for wound check   DVT Prophylaxis - Aspirin Weight-Bearing as tolerated to left leg   T. Rachelle Hora, PA-C [place] 04/08/2017, 8:31 AM

## 2017-04-08 NOTE — Progress Notes (Signed)
Pt ready for d/c home today per MD. Pt ambulated in hallway with walker with supervision. Knee immobilizer and prevena wound vac in place to left knee. Discharge instructions and prescriptions reviewed with pt, all questions answered. PIV removed, VSS. Pt assisted to car via NT.   [place], Jerry Caras

## 2017-04-08 NOTE — Care Management Obs Status (Signed)
[facility] NOTIFICATION   Patient Details  Name: Colin MINKIN Sr. MRN: [id number] Date of Birth: Jan 23, 1951   Medicare Observation Status Notification Given:  Yes    Marshell Garfinkel, RN 04/08/2017, 10:56 AM

## 2017-04-08 NOTE — Discharge Summary (Signed)
Physician Discharge Summary  Patient ID: Colin MURRELL Sr. MRN: [id number] DOB/AGE: 69/02/1950 69 y.o.  Admit date: 04/07/2017 Discharge date: 04/08/2017  Admission Diagnoses:  lateral dislocation left patella   Discharge Diagnoses: Patient Active Problem List   Diagnosis Date Noted  . Patellar dislocation 04/07/2017  . Primary localized osteoarthritis of left knee 02/19/2017  . Hyperlipidemia 11/18/2016  . Essential hypertension 11/18/2016  . Bilateral lower extremity edema 11/18/2016  . Absent pedal pulses 11/18/2016  . Vertigo 01/30/2015    Past Medical History:  Diagnosis Date  . Arthritis   . BPH (benign prostatic hyperplasia)   . Cancer ([place]) 2015   Prostate CA  . Cancer [facility]) 1996   left Kidney  . GERD (gastroesophageal reflux disease)   . High cholesterol   . History of kidney stones    left kidney several times  . Hypertension   . Pre-diabetes   . Sleep apnea   . Vitamin D deficiency      Transfusion: none   Consultants (if any):   Discharged Condition: Improved  Hospital Course: Colin HERNAN Sr. is an 67 y.o. male who was admitted 04/07/2017 with a diagnosis of left total knee patella dislocation and went to the operating room on 04/07/2017 and underwent the above named procedures.    Surgeries: Procedure(s): MEDIAL RETINACULAR REPAIR on 04/07/2017 Patient tolerated the surgery well. Taken to PACU where she was stabilized and then transferred to the orthopedic floor.  Started on Aspirin 325 BID. Foot pumps applied bilaterally at 80 mm. Heels elevated on bed with rolled towels. No evidence of DVT. Negative [place]. Physical therapy started on day #1 for gait training and transfer. OT started day #1 for ADL and assisted devices.  Patient's foley was d/c on day #1. Patient's IV and hemovac was d/c on day #2.  On post op day #3 patient was stable and ready for discharge to home .  Implants: none  He was given perioperative antibiotics:  Anti-infectives (From  admission, onward)   Start     Dose/Rate Route Frequency Ordered Stop   04/07/17 1445  ceFAZolin (ANCEF) 3 g in dextrose 5 % 50 mL IVPB     3 g 130 mL/hr over 30 Minutes Intravenous Every 6 hours 04/07/17 1437 04/08/17 0401   04/06/17 2200  ceFAZolin (ANCEF) 3 g in dextrose 5 % 50 mL IVPB     3 g 130 mL/hr over 30 Minutes Intravenous  Once 04/06/17 2158 04/07/17 1039    .  He was given sequential compression devices, early ambulation, and Aspirn for DVT prophylaxis.  He benefited maximally from the hospital stay and there were no complications.    Recent vital signs:  Vitals:   04/08/17 0338 04/08/17 0724  BP: 123/65 123/69  Pulse: 71 71  Resp: 19 18  Temp: 98.3 F (36.8 C) 98.4 F (36.9 C)  SpO2: 96% 97%    Recent laboratory studies:  Lab Results  Component Value Date   HGB 12.2 (L) 02/20/2017   HGB 12.9 (L) 02/19/2017   HGB 12.8 (L) 02/04/2017   Lab Results  Component Value Date   WBC 6.6 02/20/2017   PLT 176 02/20/2017   Lab Results  Component Value Date   INR 1.17 02/04/2017   Lab Results  Component Value Date   NA 136 02/20/2017   K 3.9 02/20/2017   CL 103 02/20/2017   CO2 25 02/20/2017   BUN 10 02/20/2017   CREATININE 0.95 02/20/2017   GLUCOSE 138 (  H) 02/20/2017    Discharge Medications:   Allergies as of 04/08/2017   No Known Allergies     Medication List    STOP taking these medications   enoxaparin 40 MG/0.4ML injection Commonly known as:  LOVENOX     TAKE these medications   acetaminophen 500 MG tablet Commonly known as:  TYLENOL Take 500 mg by mouth 2 (two) times daily.   amLODipine 10 MG tablet Commonly known as:  NORVASC Take 10 mg by mouth at bedtime.   aspirin 325 MG EC tablet Take 1 tablet (325 mg total) by mouth 2 (two) times daily. What changed:    medication strength  how much to take  when to take this   atorvastatin 80 MG tablet Commonly known as:  LIPITOR Take 80 mg by mouth at bedtime.   docusate sodium 100  MG capsule Commonly known as:  COLACE Take 200 mg by mouth daily.   fluticasone 50 MCG/ACT nasal spray Commonly known as:  FLONASE Place 1 spray into both nostrils daily as needed for allergies.   furosemide 20 MG tablet Commonly known as:  LASIX Take 20 mg by mouth daily. In  Am.   loratadine 10 MG tablet Commonly known as:  CLARITIN Take 1 tablet (10 mg total) by mouth daily.   losartan-hydrochlorothiazide 100-25 MG tablet Commonly known as:  HYZAAR Take 1 tablet by mouth daily.   meclizine 25 MG tablet Commonly known as:  ANTIVERT Take 1 tablet (25 mg total) by mouth 2 (two) times daily as needed for dizziness.   nitroGLYCERIN 0.4 MG SL tablet Commonly known as:  NITROSTAT Place 0.4 mg under the tongue every 5 (five) minutes as needed for chest pain.   omeprazole 40 MG capsule Commonly known as:  PRILOSEC Take 40 mg by mouth daily.   oxyCODONE 5 MG immediate release tablet Commonly known as:  Oxy IR/ROXICODONE Take 1-2 tablets (5-10 mg total) by mouth every 4 (four) hours as needed for moderate pain, severe pain or breakthrough pain. What changed:  reasons to take this   polyethylene glycol packet Commonly known as:  MIRALAX / GLYCOLAX Take 17 g by mouth daily as needed for mild constipation.   tamsulosin 0.4 MG Caps capsule Commonly known as:  FLOMAX Take 0.4 mg by mouth daily.       Diagnostic Studies: No results found.  Disposition: 03-Skilled Nursing Facility    Follow-up Information    Hessie Knows, MD. Go on 04/15/2017.   Specialty:  Orthopedic Surgery Contact information: [place] [address] [phone]            Signed: Feliberto Gottron 04/08/2017, 8:40 AM

## 2017-04-08 NOTE — Discharge Instructions (Signed)
KNEE  POSTOPERATIVE DIRECTIONS  Knee Rehabilitation, Guidelines Following Surgery   HOME CARE INSTRUCTIONS  Remove items at home which could result in a fall. This includes throw rugs or furniture in walking pathways.    Continue to use the breathing machine which will help keep your temperature down.  It is common for your temperature to cycle up and down following surgery, especially at night when you are not up moving around and exerting yourself.  The breathing machine keeps your lungs expanded and your temperature down.   DIET You may resume your previous home diet once your are discharged from the hospital.  DRESSING / WOUND CARE / SHOWER Follow up with [facility] on 04/15/17 for dressing change and wound check  ACTIVITY You can weight bear as tolerated on the left leg.  Do not bend left knee Keep immobilizer on at all times  WEIGHT BEARING Weight bearing as tolerated with assist device (walker, cane, etc) as directed, use it as long as suggested by your surgeon or therapist, typically at least 4-6 weeks.  POSTOPERATIVE CONSTIPATION PROTOCOL Constipation - defined medically as fewer than three stools per week and severe constipation as less than one stool per week.  One of the most common issues patients have following surgery is constipation.  Even if you have a regular bowel pattern at home, your normal regimen is likely to be disrupted due to multiple reasons following surgery.  Combination of anesthesia, postoperative narcotics, change in appetite and fluid intake all can affect your bowels.  In order to avoid complications following surgery, here are some recommendations in order to help you during your recovery period.  Colace (docusate) - Pick up an over-the-counter form of Colace or another stool softener and take twice a day as long as you are requiring postoperative pain medications.  Take with a full glass of water daily.  If you experience loose stools or diarrhea,  hold the colace until you stool forms back up.  If your symptoms do not get better within 1 week or if they get worse, check with your doctor.  Dulcolax (bisacodyl) - Pick up over-the-counter and take as directed by the product packaging as needed to assist with the movement of your bowels.  Take with a full glass of water.  Use this product as needed if not relieved by Colace only.   MiraLax (polyethylene glycol) - Pick up over-the-counter to have on hand.  MiraLax is a solution that will increase the amount of water in your bowels to assist with bowel movements.  Take as directed and can mix with a glass of water, juice, soda, coffee, or tea.  Take if you go more than two days without a movement. Do not use MiraLax more than once per day. Call your doctor if you are still constipated or irregular after using this medication for 7 days in a row.  If you continue to have problems with postoperative constipation, please contact the office for further assistance and recommendations.  If you experience "the worst abdominal pain ever" or develop nausea or vomiting, please contact the office immediatly for further recommendations for treatment.  ITCHING  If you experience itching with your medications, try taking only a single pain pill, or even half a pain pill at a time.  You can also use Benadryl over the counter for itching or also to help with sleep.   TED HOSE STOCKINGS Wear the elastic stockings on both legs for six weeks following surgery  during the day but you may remove then at night for sleeping.  MEDICATIONS See your medication summary on the After Visit Summary that the nursing staff will review with you prior to discharge.  You may have some home medications which will be placed on hold until you complete the course of blood thinner medication.  It is important for you to complete the blood thinner medication as prescribed by your surgeon.  Continue your approved medications as instructed  at time of discharge.  PRECAUTIONS If you experience chest pain or shortness of breath - call 911 immediately for transfer to the hospital emergency department.  If you develop a fever greater that 101 F, purulent drainage from wound, increased redness or drainage from wound, foul odor from the wound/dressing, or calf pain - CONTACT YOUR SURGEON.                                                   FOLLOW-UP APPOINTMENTS Make sure you keep all of your appointments after your operation with your surgeon and caregivers. You should call the office at the above phone number and make an appointment for approximately two weeks after the date of your surgery or on the date instructed by your surgeon outlined in the "After Visit Summary".   RANGE OF MOTION AND STRENGTHENING EXERCISES  Rehabilitation of the knee is important following a knee injury or an operation. After just a few days of immobilization, the muscles of the thigh which control the knee become weakened and shrink (atrophy). Knee exercises are designed to build up the tone and strength of the thigh muscles and to improve knee motion. Often times heat used for twenty to thirty minutes before working out will loosen up your tissues and help with improving the range of motion but do not use heat for the first two weeks following surgery. These exercises can be done on a training (exercise) mat, on the floor, on a table or on a bed. Use what ever works the best and is most comfortable for you Knee exercises include:  Leg Lifts - While your knee is still immobilized in a splint or cast, you can do straight leg raises. Lift the leg to 60 degrees, hold for 3 sec, and slowly lower the leg. Repeat 10-20 times 2-3 times daily. Perform this exercise against resistance later as your knee gets better.  Quad and Hamstring Sets - Tighten up the muscle on the front of the thigh (Quad) and hold for 5-10 sec. Repeat this 10-20 times hourly. Hamstring sets are done by  pushing the foot backward against an object and holding for 5-10 sec. Repeat as with quad sets.   Leg Slides: Lying on your back, slowly slide your foot toward your buttocks, bending your knee up off the floor (only go as far as is comfortable). Then slowly slide your foot back down until your leg is flat on the floor again.  Angel Wings: Lying on your back spread your legs to the side as far apart as you can without causing discomfort.  A rehabilitation program following serious knee injuries can speed recovery and prevent re-injury in the future due to weakened muscles. Contact your doctor or a physical therapist for more information on knee rehabilitation.   IF YOU ARE TRANSFERRED TO A SKILLED REHAB FACILITY If the patient is transferred  to a skilled rehab facility following release from the hospital, a list of the current medications will be sent to the facility for the patient to continue.  When discharged from the skilled rehab facility, please have the facility set up the patient's Kim prior to being released. Also, the skilled facility will be responsible for providing the patient with their medications at time of release from the facility to include their pain medication, the muscle relaxants, and their blood thinner medication. If the patient is still at the rehab facility at time of the two week follow up appointment, the skilled rehab facility will also need to assist the patient in arranging follow up appointment in our office and any transportation needs.  MAKE SURE YOU:  Understand these instructions.  Get help right away if you are not doing well or get worse.

## 2017-06-12 ENCOUNTER — Other Ambulatory Visit: Payer: Self-pay | Admitting: Orthopedic Surgery

## 2017-06-12 DIAGNOSIS — S83015A Lateral dislocation of left patella, initial encounter: Secondary | ICD-10-CM

## 2017-06-22 ENCOUNTER — Ambulatory Visit
Admission: RE | Admit: 2017-06-22 | Discharge: 2017-06-22 | Disposition: A | Discharge status: Home / Self Care | Payer: Medicare Other | Source: Ambulatory Visit | Attending: Orthopedic Surgery | Admitting: Orthopedic Surgery

## 2017-06-22 DIAGNOSIS — S83015A Lateral dislocation of left patella, initial encounter: Secondary | ICD-10-CM

## 2017-06-22 DIAGNOSIS — Q688 Other specified congenital musculoskeletal deformities: Secondary | ICD-10-CM | POA: Insufficient documentation

## 2017-06-22 DIAGNOSIS — X58XXXA Exposure to other specified factors, initial encounter: Secondary | ICD-10-CM | POA: Insufficient documentation

## 2017-06-22 DIAGNOSIS — S83005A Unspecified dislocation of left patella, initial encounter: Secondary | ICD-10-CM | POA: Insufficient documentation

## 2017-06-26 DIAGNOSIS — Z96652 Presence of left artificial knee joint: Secondary | ICD-10-CM | POA: Insufficient documentation

## 2017-12-25 ENCOUNTER — Other Ambulatory Visit: Payer: Self-pay

## 2017-12-25 ENCOUNTER — Ambulatory Visit (INDEPENDENT_AMBULATORY_CARE_PROVIDER_SITE_OTHER): Payer: Medicare Other | Admitting: Gastroenterology

## 2017-12-25 ENCOUNTER — Encounter: Payer: Self-pay | Admitting: Gastroenterology

## 2017-12-25 ENCOUNTER — Encounter (INDEPENDENT_AMBULATORY_CARE_PROVIDER_SITE_OTHER): Payer: Self-pay

## 2017-12-25 VITALS — BP 126/63 | HR 62 | Ht 69.0 in | Wt 349.2 lb

## 2017-12-25 DIAGNOSIS — R110 Nausea: Secondary | ICD-10-CM

## 2017-12-25 DIAGNOSIS — K219 Gastro-esophageal reflux disease without esophagitis: Secondary | ICD-10-CM

## 2017-12-25 NOTE — Progress Notes (Signed)
[name] MD, MRCP(U.K) [address]  [place], [place] [address]  Main: [phone]  Fax: [phone]   Gastroenterology Consultation  Referring Provider:     Gwynne Edinger, MD Primary Care Physician:  Ardine Eng, MD Primary Gastroenterologist:  Dr. [name]  Reason for Consultation:     Nausea        HPI:   Colin WILLIS Sr. is a 67 y.o. y/o male referred for consultation & management  by Dr. Yolonda Kida, Neldon Labella, MD.     He has been referred for nausea. He was previously a patient with [facility] back in 04/2016 for constipation . Mother had colon cancer. Last colonoscopy 08/2016 - 2 diminutive polyps resected.   He says he feels tired , no energy, couple of years, in CPAP- no change in energy levels since.He says that he suffers from constipation, GERD. Has nausea but no vomiting. Ongoing for years. No exposure to marijuana.   Does have a fire place at home- does not use a CO monitor. No heartburn. On omeprazole 40 mg BID. Helps but not completely . He says that he feels sick on and off. Sometimes feels full with a few bites, sometimes he feels hungry and then all of sudden feel full, belches a lot .   Recently has gained weight. He says he is diabetic.  Does not take any oxycodone although on his meds.  Past Medical History:  Diagnosis Date  . Arthritis   . BPH (benign prostatic hyperplasia)   . Cancer ([place]) 2015   Prostate CA  . Cancer [facility]) 1996   left Kidney  . GERD (gastroesophageal reflux disease)   . High cholesterol   . History of kidney stones    left kidney several times  . Hypertension   . Pre-diabetes   . Sleep apnea   . Vitamin D deficiency     Past Surgical History:  Procedure Laterality Date  . COLONOSCOPY WITH PROPOFOL N/A 09/09/2016   Procedure: COLONOSCOPY WITH PROPOFOL;  Surgeon: Lollie Sails, MD;  Location[facility] ENDOSCOPY;  Service: Endoscopy;  Laterality: N/A;  . Partial kidney resection  1996  . Prostate seed  implantation  2015  . TOTAL KNEE ARTHROPLASTY Left 02/19/2017   Procedure: TOTAL KNEE ARTHROPLASTY;  Surgeon: Hessie Knows, MD;  Location: [facility] ORS;  Service: Orthopedics;  Laterality: Left;  . TOTAL KNEE ARTHROPLASTY Left 04/07/2017   Procedure: MEDIAL RETINACULAR REPAIR;  Surgeon: Hessie Knows, MD;  Location: [facility] ORS;  Service: Orthopedics;  Laterality: Left;    Prior to Admission medications   Medication Sig Start Date End Date Taking? Authorizing Provider  acetaminophen (TYLENOL) 500 MG tablet Take 500 mg by mouth 2 (two) times daily.    Yes [provider]  amLODipine (NORVASC) 10 MG tablet Take 10 mg by mouth at bedtime.    Yes [provider]  atorvastatin (LIPITOR) 80 MG tablet Take 80 mg by mouth at bedtime.  11/12/16  Yes [provider]  docusate sodium (COLACE) 100 MG capsule Take 200 mg by mouth daily.   Yes [provider]  furosemide (LASIX) 20 MG tablet Take 20 mg by mouth daily. In  Am.   Yes [provider]  losartan-hydrochlorothiazide (HYZAAR) 100-25 MG tablet Take 1 tablet by mouth daily.   Yes [provider]  meclizine (ANTIVERT) 25 MG tablet Take 1 tablet (25 mg total) by mouth 2 (two) times daily as needed for dizziness. 02/01/15  Yes  Fritzi Mandes, MD  nitroGLYCERIN (NITROSTAT) 0.4 MG SL tablet Place 0.4 mg under the tongue every 5 (five) minutes as needed for chest pain.   Yes [provider]  omeprazole (PRILOSEC) 40 MG capsule Take 40 mg by mouth daily.    Yes [provider]  polyethylene glycol (MIRALAX / GLYCOLAX) packet Take 17 g by mouth daily as needed for mild constipation.    Yes [provider]  tamsulosin (FLOMAX) 0.4 MG CAPS capsule Take 0.4 mg by mouth daily.   Yes [provider]  Tdap Durwin Reges) 5-2.5-18.5 LF-MCG/0.5 injection  07/31/14  Yes [provider]  aspirin EC 325 MG EC tablet Take 1 tablet (325 mg total) by mouth 2 (two) times daily. Patient not  taking: Reported on 12/25/2017 04/08/17   Duanne Guess, PA-C  fluticasone [facility]) 50 MCG/ACT nasal spray Place 1 spray into both nostrils daily as needed for allergies.     [provider]  loratadine (CLARITIN) 10 MG tablet Take 1 tablet (10 mg total) by mouth daily. Patient not taking: Reported on 12/25/2017 02/01/15   Fritzi Mandes, MD  oxyCODONE (OXY IR/ROXICODONE) 5 MG immediate release tablet Take 1-2 tablets (5-10 mg total) by mouth every 4 (four) hours as needed for moderate pain, severe pain or breakthrough pain. Patient not taking: Reported on 12/25/2017 04/08/17   Duanne Guess, PA-C    Family History  Problem Relation Age of Onset  . Colon cancer Mother   . Lung cancer Father   . Benign prostatic hyperplasia Father      Social History   Tobacco Use  . Smoking status: Never Smoker  . Smokeless tobacco: Never Used  Substance Use Topics  . Alcohol use: Yes    Alcohol/week: 0.0 - 7.0 standard drinks    Comment: social  . Drug use: No    Allergies as of 12/25/2017  . (No Known Allergies)    Review of Systems:    All systems reviewed and negative except where noted in HPI.   Physical Exam:  BP 126/63   Pulse 62   Ht 5\' 9"  (1.753 m)   Wt (!) 349 lb 3.2 oz (158.4 kg)   BMI 51.57 kg/m  No LMP for male patient. Psych:  Alert and cooperative. Normal mood and affect. General:   Alert,  Well-developed, well-nourished, pleasant and cooperative in NAD Head:  Normocephalic and atraumatic. Eyes:  Sclera clear, no icterus.   Conjunctiva pink. Ears:  Normal auditory acuity. Nose:  No deformity, discharge, or lesions. Mouth:  No deformity or lesions,oropharynx pink & moist. Neck:  Supple; no masses or thyromegaly. Lungs:  Respirations even and unlabored.  Clear throughout to auscultation.   No wheezes, crackles, or rhonchi. No acute distress. Heart:  Regular rate and rhythm; no murmurs, clicks, rubs, or gallops. Abdomen:  Normal bowel sounds.  No bruits.  Soft,  non-tender and non-distended without masses, hepatosplenomegaly or hernias noted.  No guarding or rebound tenderness.    Neurologic:  Alert and oriented x3;  grossly normal neurologically. Skin:  Intact without significant lesions or rashes. No jaundice. Lymph Nodes:  No significant cervical adenopathy. Psych:  Alert and cooperative. Normal mood and affect.  Imaging Studies: No results found. Body mass index is 51.57 kg/m.  Assessment and Plan:   Colin LIGHTNER Sr. is a 67 y.o. y/o male has been referred for long standing nausea.   Possible secondary to reflux which is likely secondary to obesity . In addition he may  have gastroparesis causing reflux., I also discussed getting a carbon monoxide monitor in his house as he has a fire place and ensure he has no issues with CO poisoning. Continue PPI. I will perform an EGD to rule out any issues with the stomach and emptying. Will also get a gastric emptying .   Very high risk due to BMI of 51 for anesthesia.   I have discussed alternative options, risks & benefits,  which include, but are not limited to, bleeding, infection, perforation,respiratory complication & drug reaction.  The patient agrees with this plan & written consent will be obtained.     Follow up in 6 weeks   Dr [name] MD,MRCP(U.K)

## 2017-12-25 NOTE — Addendum Note (Signed)
Addended by: Dorethea Clan on: 12/25/2017 12:11 PM   Modules accepted: Orders, SmartSet

## 2018-01-07 ENCOUNTER — Ambulatory Visit
Admission: RE | Admit: 2018-01-07 | Discharge: 2018-01-07 | Disposition: A | Discharge status: Home / Self Care | Payer: Medicare Other | Source: Ambulatory Visit | Attending: Gastroenterology | Admitting: Gastroenterology

## 2018-01-07 DIAGNOSIS — K219 Gastro-esophageal reflux disease without esophagitis: Secondary | ICD-10-CM | POA: Diagnosis present

## 2018-01-07 MED ORDER — TECHNETIUM TC 99M SULFUR COLLOID FILTERED
2.3400 | Freq: Once | INTRAVENOUS | Status: AC | PRN
  Administered 2018-01-07: 2.34 via INTRADERMAL

## 2018-01-08 ENCOUNTER — Encounter: Payer: Self-pay | Admitting: Gastroenterology

## 2018-01-13 ENCOUNTER — Ambulatory Visit: Payer: Medicare Other | Admitting: Anesthesiology

## 2018-01-13 ENCOUNTER — Encounter
Admission: RE | Disposition: A | Discharge status: Home / Self Care | Payer: Self-pay | Source: Ambulatory Visit | Attending: Gastroenterology

## 2018-01-13 ENCOUNTER — Ambulatory Visit
Admission: RE | Admit: 2018-01-13 | Discharge: 2018-01-13 | Disposition: A | Discharge status: Home / Self Care | Payer: Medicare Other | Source: Ambulatory Visit | Attending: Gastroenterology | Admitting: Gastroenterology

## 2018-01-13 DIAGNOSIS — K3189 Other diseases of stomach and duodenum: Secondary | ICD-10-CM | POA: Diagnosis not present

## 2018-01-13 DIAGNOSIS — Z7982 Long term (current) use of aspirin: Secondary | ICD-10-CM | POA: Diagnosis not present

## 2018-01-13 DIAGNOSIS — M1990 Unspecified osteoarthritis, unspecified site: Secondary | ICD-10-CM | POA: Diagnosis not present

## 2018-01-13 DIAGNOSIS — R7303 Prediabetes: Secondary | ICD-10-CM | POA: Diagnosis not present

## 2018-01-13 DIAGNOSIS — R110 Nausea: Secondary | ICD-10-CM | POA: Insufficient documentation

## 2018-01-13 DIAGNOSIS — I10 Essential (primary) hypertension: Secondary | ICD-10-CM | POA: Diagnosis not present

## 2018-01-13 DIAGNOSIS — K219 Gastro-esophageal reflux disease without esophagitis: Secondary | ICD-10-CM

## 2018-01-13 DIAGNOSIS — Z79899 Other long term (current) drug therapy: Secondary | ICD-10-CM | POA: Diagnosis not present

## 2018-01-13 HISTORY — PX: ESOPHAGOGASTRODUODENOSCOPY (EGD) WITH PROPOFOL: SHX5813

## 2018-01-13 SURGERY — ESOPHAGOGASTRODUODENOSCOPY (EGD) WITH PROPOFOL
Anesthesia: General

## 2018-01-13 MED ORDER — PROPOFOL 500 MG/50ML IV EMUL
INTRAVENOUS | Status: DC | PRN
  Administered 2018-01-13: 90 ug/kg/min via INTRAVENOUS

## 2018-01-13 MED ORDER — SODIUM CHLORIDE 0.9 % IV SOLN
INTRAVENOUS | Status: DC
  Administered 2018-01-13: 12:00:00 via INTRAVENOUS

## 2018-01-13 MED ORDER — LIDOCAINE HCL (CARDIAC) PF 100 MG/5ML IV SOSY
PREFILLED_SYRINGE | INTRAVENOUS | Status: DC | PRN
  Administered 2018-01-13: 100 mg via INTRAVENOUS

## 2018-01-13 MED ORDER — PROPOFOL 10 MG/ML IV BOLUS
INTRAVENOUS | Status: DC | PRN
  Administered 2018-01-13: 80 mg via INTRAVENOUS

## 2018-01-13 NOTE — Anesthesia Preprocedure Evaluation (Addendum)
Anesthesia Evaluation  Patient identified by MRN, date of birth, ID band Patient awake    Reviewed: Allergy & Precautions, H&P , NPO status , Patient's Chart, lab work & pertinent test results  Airway Mallampati: III  TM Distance: >3 FB    Comment: Very large neck Dental  (+) Partial Upper   Pulmonary sleep apnea and Continuous Positive Airway Pressure Ventilation ,    breath sounds clear to auscultation       Cardiovascular hypertension,  Rhythm:regular Rate:Normal     Neuro/Psych negative neurological ROS  negative psych ROS   GI/Hepatic Neg liver ROS, GERD  ,  Endo/Other  Morbid obesity  Renal/GU negative Renal ROS  negative genitourinary   Musculoskeletal  (+) Arthritis ,   Abdominal   Peds  Hematology negative hematology ROS (+)   Anesthesia Other Findings Past Medical History: No date: Arthritis No date: BPH (benign prostatic hyperplasia) 2015: Cancer ([place])     Comment:  Prostate CA 1996: Cancer ([place])     Comment:  left Kidney No date: GERD (gastroesophageal reflux disease) No date: High cholesterol No date: History of kidney stones     Comment:  left kidney several times No date: Hypertension No date: Pre-diabetes No date: Sleep apnea No date: Vitamin D deficiency  Past Surgical History: 09/09/2016: COLONOSCOPY WITH PROPOFOL; N/A     Comment:  Procedure: COLONOSCOPY WITH PROPOFOL;  Surgeon:               Lollie Sails, MD;  Location: [facility] ENDOSCOPY;                Service: Endoscopy;  Laterality: N/A; 1996: Partial kidney resection 2015: Prostate seed implantation 02/19/2017: TOTAL KNEE ARTHROPLASTY; Left     Comment:  Procedure: TOTAL KNEE ARTHROPLASTY;  Surgeon: Hessie Knows, MD;  Location: [facility] ORS;  Service: Orthopedics;               Laterality: Left; 04/07/2017: TOTAL KNEE ARTHROPLASTY; Left     Comment:  Procedure: MEDIAL RETINACULAR REPAIR;  Surgeon: Hessie Knows, MD;  Location: [facility] ORS;  Service: Orthopedics;               Laterality: Left;  BMI    Body Mass Index:  50.95 kg/m      Reproductive/Obstetrics negative OB ROS                           Anesthesia Physical Anesthesia Plan  ASA: III  Anesthesia Plan: General   Post-op Pain Management:    Induction:   PONV Risk Score and Plan: Propofol infusion  Airway Management Planned: Natural Airway and Nasal Cannula  Additional Equipment:   Intra-op Plan:   Post-operative Plan:   Informed Consent: I have reviewed the patients History and Physical, chart, labs and discussed the procedure including the risks, benefits and alternatives for the proposed anesthesia with the patient or authorized representative who has indicated his/her understanding and acceptance.   Dental Advisory Given  Plan Discussed with: Anesthesiologist and CRNA  Anesthesia Plan Comments: (Will likely have some degree of obstruction during EGD.  Discussed possibility of intubation if needed.  Previous mask required OPA and 2 hands, previous intubation reported as grade I with MAC 3 and grade I with [place].)  Anesthesia Quick Evaluation  

## 2018-01-13 NOTE — Anesthesia Post-op Follow-up Note (Signed)
Anesthesia QCDR form completed.        

## 2018-01-13 NOTE — Transfer of Care (Signed)
Immediate Anesthesia Transfer of Care Note  Patient: Colin LUKES Sr.  Procedure(s) Performed: ESOPHAGOGASTRODUODENOSCOPY (EGD) WITH PROPOFOL (N/A )  Patient Location: PACU  Anesthesia Type:General  Level of Consciousness: drowsy and responds to stimulation  Airway & Oxygen Therapy: Patient Spontanous Breathing and Patient connected to nasal cannula oxygen  Post-op Assessment: Report given to RN and Post -op Vital signs reviewed and stable  Post vital signs: Reviewed and stable  Last Vitals:  Vitals Value Taken Time  BP 139/88 01/13/2018 12:53 PM  Temp 36.3 C 01/13/2018 12:50 PM  Pulse 62 01/13/2018 12:53 PM  Resp 0 01/13/2018 12:53 PM  SpO2 100 % 01/13/2018 12:53 PM  Vitals shown include unvalidated device data.  Last Pain:  Vitals:   01/13/18 1250  TempSrc: Tympanic  PainSc: Asleep         Complications: No apparent anesthesia complications

## 2018-01-13 NOTE — H&P (Signed)
Colin Bellows, MD [address] [place], [place], [place], [address] [address], [place], [place], [place], [address] Phone: [phone]  Fax: [phone]  Primary Care Physician:  Ardine Eng, MD   Pre-Procedure History & Physical: HPI:  Colin Morandi. is a 67 y.o. male is here for an endoscopy    Past Medical History:  Diagnosis Date  . Arthritis   . BPH (benign prostatic hyperplasia)   . Cancer ([place]) 2015   Prostate CA  . Cancer [facility]) 1996   left Kidney  . GERD (gastroesophageal reflux disease)   . High cholesterol   . History of kidney stones    left kidney several times  . Hypertension   . Pre-diabetes   . Sleep apnea   . Vitamin D deficiency     Past Surgical History:  Procedure Laterality Date  . COLONOSCOPY WITH PROPOFOL N/A 09/09/2016   Procedure: COLONOSCOPY WITH PROPOFOL;  Surgeon: Lollie Sails, MD;  Location: [facility] ENDOSCOPY;  Service: Endoscopy;  Laterality: N/A;  . Partial kidney resection  1996  . Prostate seed implantation  2015  . TOTAL KNEE ARTHROPLASTY Left 02/19/2017   Procedure: TOTAL KNEE ARTHROPLASTY;  Surgeon: Hessie Knows, MD;  Location: [facility] ORS;  Service: Orthopedics;  Laterality: Left;  . TOTAL KNEE ARTHROPLASTY Left 04/07/2017   Procedure: MEDIAL RETINACULAR REPAIR;  Surgeon: Hessie Knows, MD;  Location: [facility] ORS;  Service: Orthopedics;  Laterality: Left;    Prior to Admission medications   Medication Sig Start Date End Date Taking? Authorizing Provider  acetaminophen (TYLENOL) 500 MG tablet Take 500 mg by mouth 2 (two) times daily.    Yes [provider]  aspirin EC 81 MG tablet Take 81 mg by mouth daily.   Yes [provider]  atorvastatin (LIPITOR) 80 MG tablet Take 80 mg by mouth at bedtime.  11/12/16  Yes [provider]  docusate sodium (COLACE) 100 MG capsule Take 200 mg by mouth daily.   Yes [provider]  fluticasone (FLONASE) 50 MCG/ACT nasal spray Place 1 spray into both  nostrils daily as needed for allergies.    Yes [provider]  furosemide (LASIX) 20 MG tablet Take 20 mg by mouth daily. In  Am.   Yes [provider]  losartan-hydrochlorothiazide (HYZAAR) 100-25 MG tablet Take 1 tablet by mouth daily.   Yes [provider]  omeprazole (PRILOSEC) 40 MG capsule Take 40 mg by mouth daily.    Yes [provider]  tamsulosin (FLOMAX) 0.4 MG CAPS capsule Take 0.4 mg by mouth daily.   Yes [provider]  amLODipine (NORVASC) 10 MG tablet Take 10 mg by mouth at bedtime.     [provider]  aspirin EC 325 MG EC tablet Take 1 tablet (325 mg total) by mouth 2 (two) times daily. Patient not taking: Reported on 12/25/2017 04/08/17   Duanne Guess, PA-C  loratadine (CLARITIN) 10 MG tablet Take 1 tablet (10 mg total) by mouth daily. Patient not taking: Reported on 12/25/2017 02/01/15   Fritzi Mandes, MD  meclizine (ANTIVERT) 25 MG tablet Take 1 tablet (25 mg total) by mouth 2 (two) times daily as needed for dizziness. Patient not taking: Reported on 01/13/2018 02/01/15   Fritzi Mandes, MD  nitroGLYCERIN (NITROSTAT) 0.4 MG SL tablet Place 0.4 mg under the tongue every 5 (five) minutes as needed for chest pain.    [provider]  oxyCODONE (OXY IR/ROXICODONE) 5 MG immediate release tablet  Take 1-2 tablets (5-10 mg total) by mouth every 4 (four) hours as needed for moderate pain, severe pain or breakthrough pain. Patient not taking: Reported on 12/25/2017 04/08/17   Duanne Guess, PA-C  polyethylene glycol [facility] / GLYCOLAX) packet Take 17 g by mouth daily as needed for mild constipation.     [provider]  Tdap Durwin Reges) 5-2.5-18.5 LF-MCG/0.5 injection  07/31/14   [provider]    Allergies as of 12/25/2017  . (No Known Allergies)    Family History  Problem Relation Age of Onset  . Colon cancer Mother   . Lung cancer Father   . Benign prostatic hyperplasia Father     Social  History   Socioeconomic History  . Marital status: Married    Spouse name: Not on file  . Number of children: Not on file  . Years of education: Not on file  . Highest education level: Not on file  Occupational History  . Not on file  Social Needs  . Financial resource strain: Not on file  . Food insecurity:    Worry: Not on file    Inability: Not on file  . Transportation needs:    Medical: Not on file    Non-medical: Not on file  Tobacco Use  . Smoking status: Never Smoker  . Smokeless tobacco: Never Used  Substance and Sexual Activity  . Alcohol use: Yes    Alcohol/week: 0.0 - 7.0 standard drinks    Comment: none last 24hrs  . Drug use: No  . Sexual activity: Not on file  Lifestyle  . Physical activity:    Days per week: Not on file    Minutes per session: Not on file  . Stress: Not on file  Relationships  . Social connections:    Talks on phone: Not on file    Gets together: Not on file    Attends religious service: Not on file    Active member of club or organization: Not on file    Attends meetings of clubs or organizations: Not on file    Relationship status: Not on file  . Intimate partner violence:    Fear of current or ex partner: Not on file    Emotionally abused: Not on file    Physically abused: Not on file    Forced sexual activity: Not on file  Other Topics Concern  . Not on file  Social History Narrative  . Not on file    Review of Systems: See HPI, otherwise negative ROS  Physical Exam: BP (!) 159/50   Pulse (!) 57   Temp 97.6 F (36.4 C) (Tympanic)   Resp (!) 22   Ht 5\' 9"  (1.753 m)   Wt (!) 156.5 kg   SpO2 100%   BMI 50.95 kg/m  General:   Alert,  pleasant and cooperative in NAD Head:  Normocephalic and atraumatic. Neck:  Supple; no masses or thyromegaly. Lungs:  Clear throughout to auscultation, normal respiratory effort.    Heart:  +S1, +S2, Regular rate and rhythm, No edema. Abdomen:  Soft, nontender and nondistended. Normal  bowel sounds, without guarding, and without rebound.   Neurologic:  Alert and  oriented x4;  grossly normal neurologically.  Impression/Plan: Colin Rote Sr. is here for an endoscopy  to be performed for  evaluation of nausea    Risks, benefits, limitations, and alternatives regarding endoscopy have been reviewed with the patient.  Questions have been answered.  All parties agreeable.  Colin Bellows, MD  01/13/2018, 12:32 PM

## 2018-01-13 NOTE — Op Note (Signed)
[facility] Gastroenterology Patient Name: Colin Mccoy Procedure Date: 01/13/2018 12:33 PM MRN: [id number] Account #: [id number] Date of Birth: 1951-01-03 Admit Type: Outpatient Age: 67 Room: [facility] ENDO ROOM 3 Gender: Male Note Status: Finalized Procedure:            Upper GI endoscopy Indications:          Nausea Providers:            [name] MD, MD Referring MD:         Neldon Labella. Ashkin MD (Referring MD) Medicines:            Monitored Anesthesia Care Complications:        No immediate complications. Procedure:            Pre-Anesthesia Assessment:                       - Prior to the procedure, a History and Physical was                        performed, and patient medications, allergies and                        sensitivities were reviewed. The patient's tolerance of                        previous anesthesia was reviewed.                       - The risks and benefits of the procedure and the                        sedation options and risks were discussed with the                        patient. All questions were answered and informed                        consent was obtained.                       - ASA Grade Assessment: III - A patient with severe                        systemic disease.                       After obtaining informed consent, the endoscope was                        passed under direct vision. Throughout the procedure,                        the patient's blood pressure, pulse, and oxygen                        saturations were monitored continuously. The Endoscope                        was introduced through the mouth, and advanced to the  third part of duodenum. The upper GI endoscopy was                        accomplished with ease. The patient tolerated the                        procedure well. Findings:      The esophagus was normal.      The stomach was normal.      The cardia and gastric fundus were  normal on retroflexion.      Patchy mild mucosal changes characterized by atrophy were found in the       duodenal bulb. Biopsies were taken with a cold forceps for histology. Impression:           - Normal esophagus.                       - Normal stomach.                       - Mucosal changes in the duodenum. Biopsied. Recommendation:       - Await pathology results.                       - Discharge patient to home (with escort).                       - Resume previous diet.                       - Continue present medications.                       - Return to my office in 2 weeks. Procedure Code(s):    --- Professional ---                       [phone], Esophagogastroduodenoscopy, flexible, transoral;                        with biopsy, single or multiple Diagnosis Code(s):    --- Professional ---                       K31.89, Other diseases of stomach and duodenum                       R11.0, Nausea CPT copyright 2018 American Medical Association. All rights reserved. The codes documented in this report are preliminary and upon coder review may  be revised to meet current compliance requirements. [name], MD [name] MD, MD 01/13/2018 12:49:16 PM This report has been signed electronically. Number of Addenda: 0 Note Initiated On: 01/13/2018 12:33 PM      [facility]

## 2018-01-18 ENCOUNTER — Encounter: Payer: Self-pay | Admitting: Gastroenterology

## 2018-01-18 NOTE — Anesthesia Postprocedure Evaluation (Signed)
Anesthesia Post Note  Patient: Colin FLANERY Sr.  Procedure(s) Performed: ESOPHAGOGASTRODUODENOSCOPY (EGD) WITH PROPOFOL (N/A )  Patient location during evaluation: PACU Anesthesia Type: General Level of consciousness: awake and alert Pain management: pain level controlled Vital Signs Assessment: post-procedure vital signs reviewed and stable Respiratory status: spontaneous breathing, nonlabored ventilation, respiratory function stable and patient connected to nasal cannula oxygen Cardiovascular status: blood pressure returned to baseline and stable Postop Assessment: no apparent nausea or vomiting Anesthetic complications: no     Last Vitals:  Vitals:   01/13/18 1300 01/13/18 1310  BP: 137/89 (!) 142/110  Pulse: 69 (!) 57  Resp: 14 18  Temp:    SpO2: 100% 99%    Last Pain:  Vitals:   01/14/18 0856  TempSrc:   PainSc: 0-No pain                 Durenda Hurt

## 2018-01-19 LAB — SURGICAL PATHOLOGY

## 2018-01-20 ENCOUNTER — Encounter: Payer: Self-pay | Admitting: Gastroenterology

## 2018-02-11 ENCOUNTER — Encounter: Payer: Self-pay | Admitting: Gastroenterology

## 2018-02-11 ENCOUNTER — Ambulatory Visit (INDEPENDENT_AMBULATORY_CARE_PROVIDER_SITE_OTHER): Payer: Medicare Other | Admitting: Gastroenterology

## 2018-02-11 VITALS — BP 142/66 | HR 47 | Ht 69.0 in | Wt 352.4 lb

## 2018-02-11 DIAGNOSIS — K5900 Constipation, unspecified: Secondary | ICD-10-CM | POA: Diagnosis not present

## 2018-02-11 DIAGNOSIS — R140 Abdominal distension (gaseous): Secondary | ICD-10-CM

## 2018-02-11 NOTE — Progress Notes (Signed)
[name] MD, MRCP(U.K) [address]  [place]  [place], [place] [address]  Main: [phone]  Fax: [phone]   Primary Care Physician: Ardine Eng, MD  Primary Gastroenterologist:  Dr. [name]   No chief complaint on file.   HPI: Colin LIVINGSTONE Sr. is a 67 y.o. male    Summary of history :  He was initially referred and seen back in 12/2017 for nausea. He was previously a patient with [facility] back in 04/2016 for constipation . Mother had colon cancer. Last colonoscopy 08/2016 - 2 diminutive polyps resected. He says he feels tired , no energy, couple of years, in CPAP- no change in energy levels since.He says that he suffers from constipation, GERD. Has nausea but no vomiting. Ongoing for years. No exposure to marijuana. Does have a fire place at home- does not use a CO monitor. No heartburn. On omeprazole 40 mg BID. Helps but not completely . He says that he feels sick on and off. Sometimes feels full with a few bites, sometimes he feels hungry and then all of sudden feel full, belches a lot .   Recently has gained weight. He says he is diabetic.  Does not take any oxycodone although on his meds   Interval history   12/25/2017-  02/11/2018  01/07/18 : Normal gastric emptying study  01/13/18 : EGD: bx taken show chronic active duodenitis - negative for H pylori   Gained 3 lbs   Still has his gall bladder. Got CO monitor. Main issue is fatigue.   Not having regular bowel movement , when he has regular bowel movement feels much better, main issue is bloating and not nausea.    Current Outpatient Medications  Medication Sig Dispense Refill  . acetaminophen (TYLENOL) 500 MG tablet Take 500 mg by mouth 2 (two) times daily.     Marland Kitchen amLODipine (NORVASC) 10 MG tablet Take 10 mg by mouth at bedtime.     Marland Kitchen aspirin EC 325 MG EC tablet Take 1 tablet (325 mg total) by mouth 2 (two) times daily. (Patient not taking: Reported on 12/25/2017) 60 tablet 0  . aspirin EC 81 MG tablet  Take 81 mg by mouth daily.    Marland Kitchen atorvastatin (LIPITOR) 80 MG tablet Take 80 mg by mouth at bedtime.     . docusate sodium (COLACE) 100 MG capsule Take 200 mg by mouth daily.    . fluticasone (FLONASE) 50 MCG/ACT nasal spray Place 1 spray into both nostrils daily as needed for allergies.     . furosemide (LASIX) 20 MG tablet Take 20 mg by mouth daily. In  Am.    . loratadine (CLARITIN) 10 MG tablet Take 1 tablet (10 mg total) by mouth daily. (Patient not taking: Reported on 12/25/2017) 30 tablet 0  . losartan-hydrochlorothiazide (HYZAAR) 100-25 MG tablet Take 1 tablet by mouth daily.    . meclizine (ANTIVERT) 25 MG tablet Take 1 tablet (25 mg total) by mouth 2 (two) times daily as needed for dizziness. (Patient not taking: Reported on 01/13/2018) 30 tablet 0  . nitroGLYCERIN (NITROSTAT) 0.4 MG SL tablet Place 0.4 mg under the tongue every 5 (five) minutes as needed for chest pain.    Marland Kitchen omeprazole (PRILOSEC) 40 MG capsule Take 40 mg by mouth daily.     Marland Kitchen oxyCODONE (OXY IR/ROXICODONE) 5 MG immediate release tablet Take 1-2 tablets (5-10 mg total) by mouth every 4 (four) hours as needed for moderate pain, severe pain or breakthrough  pain. (Patient not taking: Reported on 12/25/2017) 40 tablet 0  . polyethylene glycol (MIRALAX / GLYCOLAX) packet Take 17 g by mouth daily as needed for mild constipation.     . tamsulosin (FLOMAX) 0.4 MG CAPS capsule Take 0.4 mg by mouth daily.    . Tdap (BOOSTRIX) 5-2.5-18.5 LF-MCG/0.5 injection      No current facility-administered medications for this visit.     Allergies as of 02/11/2018  . (No Known Allergies)    ROS:  General: Negative for anorexia, weight loss, fever, chills, fatigue, weakness. ENT: Negative for hoarseness, difficulty swallowing , nasal congestion. CV: Negative for chest pain, angina, palpitations, dyspnea on exertion, peripheral edema.  Respiratory: Negative for dyspnea at rest, dyspnea on exertion, cough, sputum, wheezing.  GI: See history  of present illness. GU:  Negative for dysuria, hematuria, urinary incontinence, urinary frequency, nocturnal urination.  Endo: Negative for unusual weight change.    Physical Examination:   There were no vitals taken for this visit.  General: Well-nourished, well-developed in no acute distress.  Eyes: No icterus. Conjunctivae pink. Mouth: Oropharyngeal mucosa moist and pink , no lesions erythema or exudate. Lungs: Clear to auscultation bilaterally. Non-labored. Heart: Regular rate and rhythm, no murmurs rubs or gallops.  Abdomen: Bowel sounds are normal, nontender, nondistended, no hepatosplenomegaly or masses, no abdominal bruits or hernia , no rebound or guarding.   Extremities: No lower extremity edema. No clubbing or deformities. Neuro: Alert and oriented x 3.  Grossly intact. Skin: Warm and dry, no jaundice.   Psych: Alert and cooperative, normal mood and affect.   Imaging Studies: No results found.  Assessment and Plan:   Colin MEMOLI Sr. is a 67 y.o. y/o male long standing nausea. Today he better describes the nausea as bloating when he has not had a satisfactory bowel movement. He feels good on days he has a good movement.   Plan  1. Commence on Linzess 145 mcg , he will call back in a week , if no better will increase dose to 290 mcg      Dr [name]  MD,MRCP [facility]) Follow up in 8 weeks

## 2018-02-15 ENCOUNTER — Telehealth: Payer: Self-pay | Admitting: Gastroenterology

## 2018-02-15 NOTE — Telephone Encounter (Signed)
Patient called today and was in last week where he received samples of Linzes 145 mg. However he reports they tore his stomach up ans would like to try the lower dose. Please call him when she samples are ready. We can call his cell or home #.

## 2018-02-18 NOTE — Telephone Encounter (Signed)
Spoke with pt regarding his trial of Linzess 145 mcg. Pt states he feels the 145 mcg is too strong and requests samples of the low dose 52mcg Linzess. I explained to pt that we'll have the Linzess 72 mcg samples at our office for him to pick up.

## 2018-02-21 ENCOUNTER — Emergency Department
Admission: EM | Admit: 2018-02-21 | Discharge: 2018-02-21 | Disposition: A | Discharge status: Home / Self Care | Payer: Medicare Other | Attending: Student in an Organized Health Care Education/Training Program | Admitting: Student in an Organized Health Care Education/Training Program

## 2018-02-21 ENCOUNTER — Other Ambulatory Visit: Payer: Self-pay

## 2018-02-21 ENCOUNTER — Emergency Department: Payer: Medicare Other

## 2018-02-21 ENCOUNTER — Encounter: Payer: Self-pay | Admitting: Emergency Medicine

## 2018-02-21 DIAGNOSIS — Z7982 Long term (current) use of aspirin: Secondary | ICD-10-CM | POA: Insufficient documentation

## 2018-02-21 DIAGNOSIS — R079 Chest pain, unspecified: Secondary | ICD-10-CM | POA: Diagnosis present

## 2018-02-21 DIAGNOSIS — R0981 Nasal congestion: Secondary | ICD-10-CM

## 2018-02-21 DIAGNOSIS — Z79899 Other long term (current) drug therapy: Secondary | ICD-10-CM | POA: Diagnosis not present

## 2018-02-21 DIAGNOSIS — R5382 Chronic fatigue, unspecified: Secondary | ICD-10-CM | POA: Insufficient documentation

## 2018-02-21 DIAGNOSIS — I10 Essential (primary) hypertension: Secondary | ICD-10-CM | POA: Diagnosis not present

## 2018-02-21 LAB — BASIC METABOLIC PANEL
ANION GAP: 10 (ref 5–15)
BUN: 12 mg/dL (ref 8–23)
CO2: 25 mmol/L (ref 22–32)
Calcium: 8.9 mg/dL (ref 8.9–10.3)
Chloride: 104 mmol/L (ref 98–111)
Creatinine, Ser: 1.04 mg/dL (ref 0.61–1.24)
GFR calc Af Amer: >60 mL/min (ref >60)
GFR calc non Af Amer: >60 mL/min (ref >60)
GLUCOSE: 126 mg/dL — AB (ref 70–99)
Potassium: 3.6 mmol/L (ref 3.5–5.1)
Sodium: 139 mmol/L (ref 135–145)

## 2018-02-21 LAB — URINALYSIS, COMPLETE (UACMP) WITH MICROSCOPIC
Bacteria, UA: NONE SEEN
Bilirubin Urine: NEGATIVE
GLUCOSE, UA: NEGATIVE mg/dL
HGB URINE DIPSTICK: NEGATIVE
Ketones, ur: NEGATIVE mg/dL
Leukocytes, UA: NEGATIVE
NITRITE: NEGATIVE
PROTEIN: NEGATIVE mg/dL
SQUAMOUS EPITHELIAL / LPF: NONE SEEN (ref 0–5)
Specific Gravity, Urine: 1.02 (ref 1.005–1.030)
pH: 6 (ref 5.0–8.0)

## 2018-02-21 LAB — BRAIN NATRIURETIC PEPTIDE: B NATRIURETIC PEPTIDE 5: 40 pg/mL (ref 0.0–100.0)

## 2018-02-21 LAB — CBC
HEMATOCRIT: 41.9 % (ref 39.0–52.0)
Hemoglobin: 13.4 g/dL (ref 13.0–17.0)
MCH: 30.5 pg (ref 26.0–34.0)
MCHC: 32 g/dL (ref 30.0–36.0)
MCV: 95.4 fL (ref 80.0–100.0)
Platelets: 196 10*3/uL (ref 150–400)
RBC: 4.39 MIL/uL (ref 4.22–5.81)
RDW: 14.6 % (ref 11.5–15.5)
WBC: 5.3 10*3/uL (ref 4.0–10.5)
nRBC: 0 % (ref 0.0–0.2)

## 2018-02-21 LAB — TROPONIN I: Troponin I: <0.03 ng/mL (ref <0.03)

## 2018-02-21 LAB — GLUCOSE, CAPILLARY: Glucose-Capillary: 113 mg/dL — ABNORMAL HIGH (ref 70–99)

## 2018-02-21 MED ORDER — DOXYCYCLINE HYCLATE 100 MG PO TABS: 100.0000 mg | ORAL_TABLET | Freq: Two times a day (BID) | ORAL | 0 refills | Status: AC

## 2018-02-21 NOTE — Discharge Instructions (Signed)
Follow up with Dr. Wallis Bamberg and Dr. Saralyn Pilar.  Return for any worsening symptoms, questions or concerns.

## 2018-02-21 NOTE — ED Notes (Signed)
Pt verbalized understanding of discharge instructions. NAD at this time. 

## 2018-02-21 NOTE — ED Provider Notes (Signed)
[facility] Emergency Department Provider Note    First MD Initiated Contact with Patient 02/21/18 1650     (approximate)  I have reviewed the triage vital signs and the nursing notes.   HISTORY  Chief Complaint Chest Pain and Polyuria    HPI Colin LAMBSON Sr. is a 68 y.o. male with past medical history as listed below presenting with several months of persistent intermittent chest pressure nasal congestion malaise and increased urinary frequency.  Has not been on any recent antibiotics.  Denies any chest pain at this time.  No shortness of breath.  No orthopnea.  No weight gain.  He is been compliant with his medications.  Primary complaint is nasal congestion and nonproductive cough.  No pleuritic pain.    Past Medical History:  Diagnosis Date  . Arthritis   . BPH (benign prostatic hyperplasia)   . Cancer ([place]) 2015   Prostate CA  . Cancer [facility]) 1996   left Kidney  . GERD (gastroesophageal reflux disease)   . High cholesterol   . History of kidney stones    left kidney several times  . Hypertension   . Pre-diabetes   . Sleep apnea   . Vitamin D deficiency    Family History  Problem Relation Age of Onset  . Colon cancer Mother   . Lung cancer Father   . Benign prostatic hyperplasia Father    Past Surgical History:  Procedure Laterality Date  . COLONOSCOPY WITH PROPOFOL N/A 09/09/2016   Procedure: COLONOSCOPY WITH PROPOFOL;  Surgeon: Lollie Sails, MD;  Location[facility] ENDOSCOPY;  Service: Endoscopy;  Laterality: N/A;  . ESOPHAGOGASTRODUODENOSCOPY (EGD) WITH PROPOFOL N/A 01/13/2018   Procedure: ESOPHAGOGASTRODUODENOSCOPY (EGD) WITH PROPOFOL;  Surgeon: [name], MD;  Location: [facility] ENDOSCOPY;  Service: Gastroenterology;  Laterality: N/A;  . Partial kidney resection  1996  . Prostate seed implantation  2015  . 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 TOTAL KNEE ARTHROPLASTY Left 04/07/2017   Procedure: MEDIAL RETINACULAR REPAIR;  Surgeon: Hessie Knows, MD;  Location: [facility] ORS;  Service: Orthopedics;  Laterality: Left;   Patient Active Problem List   Diagnosis Date Noted  . Patellar dislocation 04/07/2017  . Primary localized osteoarthritis of left knee 02/19/2017  . Hyperlipidemia 11/18/2016  . Essential hypertension 11/18/2016  . Bilateral lower extremity edema 11/18/2016  . Absent pedal pulses 11/18/2016  . Vertigo 01/30/2015      Prior to Admission medications   Medication Sig Start Date End Date Taking? Authorizing Provider  acetaminophen (TYLENOL) 500 MG tablet Take 500 mg by mouth 2 (two) times daily.     [provider]  amLODipine (NORVASC) 10 MG tablet Take 10 mg by mouth at bedtime.     [provider]  aspirin EC 325 MG EC tablet Take 1 tablet (325 mg total) by mouth 2 (two) times daily. Patient not taking: Reported on 12/25/2017 04/08/17   Duanne Guess, PA-C  aspirin EC 81 MG tablet Take 81 mg by mouth daily.    [provider]  atorvastatin (LIPITOR) 80 MG tablet Take 80 mg by mouth at bedtime.  11/12/16   [provider]  docusate sodium (COLACE) 100 MG capsule Take 200 mg by mouth daily.    [provider]  doxycycline (VIBRA-TABS) 100 MG tablet Take 1 tablet (100 mg total) by mouth 2 (two)  times daily for 5 days. 02/21/18 02/26/18  Merlyn Lot, MD  fluticasone (FLONASE) 50 MCG/ACT nasal spray Place 1 spray into both nostrils daily as needed for allergies.     [provider]  furosemide (LASIX) 20 MG tablet Take 20 mg by mouth daily. In  Am.    [provider]  loratadine (CLARITIN) 10 MG tablet Take 1 tablet (10 mg total) by mouth daily. Patient not taking: Reported on 12/25/2017 02/01/15   Fritzi Mandes, MD  losartan-hydrochlorothiazide (HYZAAR) 100-25 MG tablet Take 1 tablet by mouth daily.    [provider]  meclizine (ANTIVERT) 25 MG tablet Take 1 tablet (25 mg total) by mouth 2 (two) times daily as needed for dizziness. Patient not taking: Reported on 01/13/2018 02/01/15   Fritzi Mandes, MD  nitroGLYCERIN (NITROSTAT) 0.4 MG SL tablet Place 0.4 mg under the tongue every 5 (five) minutes as needed for chest pain.    [provider]  omeprazole (PRILOSEC) 40 MG capsule Take 40 mg by mouth daily.     [provider]  oxyCODONE (OXY IR/ROXICODONE) 5 MG immediate release tablet Take 1-2 tablets (5-10 mg total) by mouth every 4 (four) hours as needed for moderate pain, severe pain or breakthrough pain. Patient not taking: Reported on 12/25/2017 04/08/17   Duanne Guess, PA-C  polyethylene glycol [facility] / GLYCOLAX) packet Take 17 g by mouth daily as needed for mild constipation.     [provider]  tamsulosin (FLOMAX) 0.4 MG CAPS capsule Take 0.4 mg by mouth daily.    [provider]  Tdap Durwin Reges) 5-2.5-18.5 LF-MCG/0.5 injection  07/31/14   [provider]    Allergies Patient has no known allergies.    Social History Social History   Tobacco Use  . Smoking status: Never Smoker  . Smokeless tobacco: Never Used  Substance Use Topics  . Alcohol use: Yes    Alcohol/week: 0.0 - 7.0 standard drinks    Comment: none last 24hrs  . Drug use: No    Review of Systems Patient denies headaches, rhinorrhea, blurry vision, numbness, shortness of breath, chest pain, edema, cough, abdominal pain, nausea, vomiting, diarrhea, dysuria, fevers, rashes or hallucinations unless otherwise stated above in HPI. ____________________________________________   PHYSICAL EXAM:  VITAL SIGNS: Vitals:   02/21/18 1440 02/21/18 1905  BP: (!) 145/79 131/86  Pulse: 68 (!) 56  Resp: 16 16  Temp: 98.3 F (36.8 C) 98.3 F (36.8 C)  SpO2: 100% 100%    Constitutional: Alert and oriented.  Eyes: Conjunctivae are normal.  Head: Atraumatic. Nose: No  congestion/rhinnorhea. Mouth/Throat: Mucous membranes are moist.   Neck: No stridor. Painless ROM.  Cardiovascular: Normal rate, regular rhythm. Grossly normal heart sounds.  Good peripheral circulation. Respiratory: Normal respiratory effort.  No retractions. Lungs with coarse bibasilar breathsounds.  No rhonchi. Gastrointestinal: Soft and nontender. No distention. No abdominal bruits. No CVA tenderness. Genitourinary: deferred Musculoskeletal: No lower extremity tenderness 2+ BLE No joint effusions. Neurologic:  Normal speech and language. No gross focal neurologic deficits are appreciated. No facial droop Skin:  Skin is warm, dry and intact. No rash noted. Psychiatric: Mood and affect are normal. Speech and behavior are normal.  ____________________________________________   LABS (all labs ordered are listed, but only abnormal results are displayed)  Results for orders placed or performed during the hospital encounter of 02/21/18 (from the past 24 hour(s))  Glucose, capillary     Status: Abnormal   Collection Time: 02/21/18  2:31 PM  Result Value Ref Range   Glucose-Capillary 113 (H) 70 - 99 mg/dL  Basic metabolic panel     Status: Abnormal   Collection Time: 02/21/18  2:44 PM  Result Value Ref Range   Sodium 139 135 - 145 mmol/L   Potassium 3.6 3.5 - 5.1 mmol/L   Chloride 104 98 - 111 mmol/L   CO2 25 22 - 32 mmol/L   Glucose, Bld 126 (H) 70 - 99 mg/dL   BUN 12 8 - 23 mg/dL   Creatinine, Ser 1.04 0.61 - 1.24 mg/dL   Calcium 8.9 8.9 - 10.3 mg/dL   GFR calc non Af Amer >60 >60 mL/min   GFR calc Af Amer >60 >60 mL/min   Anion gap 10 5 - 15  CBC     Status: None   Collection Time: 02/21/18  2:44 PM  Result Value Ref Range   WBC 5.3 4.0 - 10.5 K/uL   RBC 4.39 4.22 - 5.81 MIL/uL   Hemoglobin 13.4 13.0 - 17.0 g/dL   HCT 41.9 39.0 - 52.0 %   MCV 95.4 80.0 - 100.0 fL   MCH 30.5 26.0 - 34.0 pg   MCHC 32.0 30.0 - 36.0 g/dL   RDW 14.6 11.5 - 15.5 %   Platelets 196 150 - 400 K/uL    nRBC 0.0 0.0 - 0.2 %  Troponin I - ONCE - STAT     Status: None   Collection Time: 02/21/18  2:44 PM  Result Value Ref Range   Troponin I <0.03 <0.03 ng/mL  Brain natriuretic peptide     Status: None   Collection Time: 02/21/18  5:10 PM  Result Value Ref Range   B Natriuretic Peptide 40.0 0.0 - 100.0 pg/mL  Urinalysis, Complete w Microscopic     Status: Abnormal   Collection Time: 02/21/18  6:15 PM  Result Value Ref Range   Color, Urine YELLOW (A) YELLOW   APPearance CLEAR (A) CLEAR   Specific Gravity, Urine 1.020 1.005 - 1.030   pH 6.0 5.0 - 8.0   Glucose, UA NEGATIVE NEGATIVE mg/dL   Hgb urine dipstick NEGATIVE NEGATIVE   Bilirubin Urine NEGATIVE NEGATIVE   Ketones, ur NEGATIVE NEGATIVE mg/dL   Protein, ur NEGATIVE NEGATIVE mg/dL   Nitrite NEGATIVE NEGATIVE   Leukocytes, UA NEGATIVE NEGATIVE   RBC / HPF 0-5 0 - 5 RBC/hpf   WBC, UA 0-5 0 - 5 WBC/hpf   Bacteria, UA NONE SEEN NONE SEEN   Squamous Epithelial / LPF NONE SEEN 0 - 5   Mucus PRESENT    Hyaline Casts, UA PRESENT    ____________________________________________  EKG My review and personal interpretation at Time: 14:34   Indication: chest pain  Rate: 75  Rhythm: sinus Axis: normal Other: normal intervals, frequent pvcs, nonspecific st abn ____________________________________________  RADIOLOGY  I personally reviewed all radiographic images ordered to evaluate for the above acute complaints and reviewed radiology reports and findings.  These findings were personally discussed with the patient.  Please see medical record for radiology report.  ____________________________________________   PROCEDURES  Procedure(s) performed:  Procedures    Critical Care performed: no ____________________________________________   INITIAL IMPRESSION / ASSESSMENT AND PLAN / ED COURSE  Pertinent labs & imaging results that were available during my care of the patient were reviewed by me and considered in my medical  decision making (see chart for details).   DDX: URI, sinusitis, pneumonia, bronchitis, some congestive heart failure, ACS, UTI, nephrotic syndrome  Larwance Rote  Sr. is a 68 y.o. who presents to the ED with symptoms as described above.  Patient in no acute distress.  Symptoms of been ongoing for several months.  Had recent evaluation at [facility] for chest pain.  Enzymes are stable then.  Does not seem clinically consistent with ACS dissection or PE.  Doubt congestive heart failure.  Seems to be primarily complaining about sinus congestion for the past several weeks.  Blood work sent for above differential.  Will order chest x-ray.  Clinical Course as of Feb 21 1910  Sun Feb 21, 2018  1851 Patient reassessed.  Remains hemodynamically stable.  No chest pain.  Buttock is reassuring.  IVs have been having purulent congestion for over 3 weeks we will give a course of abx for sinusitis.   [PR]    Clinical Course User Index [PR] Merlyn Lot, MD     As part of my medical decision making, I reviewed the following data within the [place] notes reviewed and incorporated, Labs reviewed, notes from prior ED visits.   ____________________________________________   FINAL CLINICAL IMPRESSION(S) / ED DIAGNOSES  Final diagnoses:  Congestion of nasal sinus  Chronic fatigue      NEW MEDICATIONS STARTED DURING THIS VISIT:  New Prescriptions   DOXYCYCLINE (VIBRA-TABS) 100 MG TABLET    Take 1 tablet (100 mg total) by mouth 2 (two) times daily for 5 days.     Note:  This document was prepared using Dragon voice recognition software and may include unintentional dictation errors.    Merlyn Lot, MD 02/21/18 [phone]

## 2018-02-21 NOTE — ED Triage Notes (Signed)
Pt to ED via POV c/o chest pressure and polyuria. Pt states that he has been feeling bad on and off for the past 6 months. Pt states that he has been worse over the last 2-3 weeks. Pt states that he has episodes of dizziness, pressure in his chest and stomach, and increased urination. Pt CBG upon arrival was 113. Pt does not appear to be in any distress at this time. Pt states that he feels tired all of the time.

## 2018-03-12 ENCOUNTER — Ambulatory Visit
Admission: EM | Admit: 2018-03-12 | Discharge: 2018-03-12 | Disposition: A | Discharge status: Home / Self Care | Payer: Medicare Other | Attending: Family Medicine | Admitting: Family Medicine

## 2018-03-12 ENCOUNTER — Other Ambulatory Visit: Payer: Self-pay

## 2018-03-12 ENCOUNTER — Encounter: Payer: Self-pay | Admitting: Emergency Medicine

## 2018-03-12 DIAGNOSIS — J0100 Acute maxillary sinusitis, unspecified: Secondary | ICD-10-CM | POA: Insufficient documentation

## 2018-03-12 DIAGNOSIS — B9789 Other viral agents as the cause of diseases classified elsewhere: Secondary | ICD-10-CM | POA: Diagnosis not present

## 2018-03-12 MED ORDER — AMOXICILLIN 875 MG PO TABS: 875.0000 mg | ORAL_TABLET | Freq: Two times a day (BID) | ORAL | 0 refills | Status: DC

## 2018-03-12 NOTE — ED Triage Notes (Signed)
Pt c/o head congestion, post nasal drainage, sinus pain/pressure, and dizziness. Started about a week ago.

## 2018-03-12 NOTE — ED Provider Notes (Signed)
[facility]    CSN: [id number] Arrival date & time: 03/12/18  1310     History   Chief Complaint No chief complaint on file.   HPI Colin DIEMER Sr. is a 68 y.o. male.   The history is provided by the patient.  URI  Presenting symptoms: congestion, cough, facial pain, fatigue and rhinorrhea   Severity:  Moderate Onset quality:  Sudden Duration:  1 week Timing:  Constant Progression:  Unchanged Chronicity:  New Relieved by:  None tried Ineffective treatments:  None tried Associated symptoms: sinus pain   Risk factors: sick contacts     Past Medical History:  Diagnosis Date  . Arthritis   . BPH (benign prostatic hyperplasia)   . Cancer ([place]) 2015   Prostate CA  . Cancer [facility]) 1996   left Kidney  . GERD (gastroesophageal reflux disease)   . High cholesterol   . History of kidney stones    left kidney several times  . Hypertension   . Pre-diabetes   . Sleep apnea   . Vitamin D deficiency     Patient Active Problem List   Diagnosis Date Noted  . Patellar dislocation 04/07/2017  . Primary localized osteoarthritis of left knee 02/19/2017  . Hyperlipidemia 11/18/2016  . Essential hypertension 11/18/2016  . Bilateral lower extremity edema 11/18/2016  . Absent pedal pulses 11/18/2016  . Vertigo 01/30/2015    Past Surgical History:  Procedure Laterality Date  . COLONOSCOPY WITH PROPOFOL N/A 09/09/2016   Procedure: COLONOSCOPY WITH PROPOFOL;  Surgeon: Lollie Sails, MD;  Location: [facility] ENDOSCOPY;  Service: Endoscopy;  Laterality: N/A;  . ESOPHAGOGASTRODUODENOSCOPY (EGD) WITH PROPOFOL N/A 01/13/2018   Procedure: ESOPHAGOGASTRODUODENOSCOPY (EGD) WITH PROPOFOL;  Surgeon: [name], MD;  Location: [facility] ENDOSCOPY;  Service: Gastroenterology;  Laterality: N/A;  . Partial kidney resection  1996  . Prostate seed implantation  2015  . TOTAL KNEE ARTHROPLASTY Left 02/19/2017   Procedure: TOTAL KNEE ARTHROPLASTY;  Surgeon: Hessie Knows, MD;  Location:  [facility] ORS;  Service: Orthopedics;  Laterality: Left;  . TOTAL KNEE ARTHROPLASTY Left 04/07/2017   Procedure: MEDIAL RETINACULAR REPAIR;  Surgeon: Hessie Knows, MD;  Location: [facility] ORS;  Service: Orthopedics;  Laterality: Left;       Home Medications    Prior to Admission medications   Medication Sig Start Date End Date Taking? Authorizing Provider  acetaminophen (TYLENOL) 500 MG tablet Take 500 mg by mouth 2 (two) times daily.    Yes [provider]  amLODipine (NORVASC) 10 MG tablet Take 10 mg by mouth at bedtime.    Yes [provider]  aspirin EC 325 MG EC tablet Take 1 tablet (325 mg total) by mouth 2 (two) times daily. 04/08/17  Yes Duanne Guess, PA-C  aspirin EC 81 MG tablet Take 81 mg by mouth daily.   Yes [provider]  atorvastatin (LIPITOR) 80 MG tablet Take 80 mg by mouth at bedtime.  11/12/16  Yes [provider]  docusate sodium (COLACE) 100 MG capsule Take 200 mg by mouth daily.   Yes [provider]  fluticasone (FLONASE) 50 MCG/ACT nasal spray Place 1 spray into both nostrils daily as needed for allergies.    Yes [provider]  furosemide (LASIX) 20 MG tablet Take 20 mg by mouth daily. In  Am.   Yes [provider]  loratadine (CLARITIN) 10 MG tablet Take 1 tablet (10 mg total) by mouth daily. 02/01/15  Yes Fritzi Mandes, MD  losartan-hydrochlorothiazide [facility])  100-25 MG tablet Take 1 tablet by mouth daily.   Yes [provider]  nitroGLYCERIN (NITROSTAT) 0.4 MG SL tablet Place 0.4 mg under the tongue every 5 (five) minutes as needed for chest pain.   Yes [provider]  omeprazole (PRILOSEC) 40 MG capsule Take 40 mg by mouth daily.    Yes [provider]  polyethylene glycol (MIRALAX / GLYCOLAX) packet Take 17 g by mouth daily as needed for mild constipation.    Yes [provider]  tamsulosin (FLOMAX) 0.4 MG CAPS capsule Take 0.4 mg by mouth daily.   Yes [provider]  amoxicillin (AMOXIL) 875 MG tablet Take 1 tablet (875 mg total) by mouth 2 (two) times daily. 03/12/18   Norval Gable, MD  DULoxetine (CYMBALTA) 60 MG capsule TAKE 1 CAPSULE BY MOUTH ONCE DAILY FOR MOOD AND PAIN 03/05/18   [provider]  meclizine (ANTIVERT) 25 MG tablet Take 1 tablet (25 mg total) by mouth 2 (two) times daily as needed for dizziness. Patient not taking: Reported on 01/13/2018 02/01/15   Fritzi Mandes, MD  oxyCODONE (OXY IR/ROXICODONE) 5 MG immediate release tablet Take 1-2 tablets (5-10 mg total) by mouth every 4 (four) hours as needed for moderate pain, severe pain or breakthrough pain. Patient not taking: Reported on 12/25/2017 04/08/17   Duanne Guess, PA-C  Tdap Durwin Reges) 5-2.5-18.5 LF-MCG/0.5 injection  07/31/14   [provider]    Family History Family History  Problem Relation Age of Onset  . Colon cancer Mother   . Lung cancer Father   . Benign prostatic hyperplasia Father     Social History Social History   Tobacco Use  . Smoking status: Never Smoker  . Smokeless tobacco: Never Used  Substance Use Topics  . Alcohol use: Yes    Alcohol/week: 0.0 - 7.0 standard drinks    Comment: none last 24hrs  . Drug use: No     Allergies   Patient has no known allergies.   Review of Systems Review of Systems  Constitutional: Positive for fatigue.  HENT: Positive for congestion, rhinorrhea and sinus pain.   Respiratory: Positive for cough.      Physical Exam Triage Vital Signs ED Triage Vitals  Enc Vitals Group     BP 03/12/18 1333 (!) 145/73     Pulse Rate 03/12/18 1333 70     Resp 03/12/18 1333 18     Temp 03/12/18 1333 98.2 F (36.8 C)     Temp Source 03/12/18 1333 Oral     SpO2 03/12/18 1333 99 %     Weight 03/12/18 1328 245 lb (111.1 kg)     Height 03/12/18 1328 5\' 9"  (1.753 m)     Head Circumference --      Peak Flow --      Pain Score 03/12/18 1328 7     Pain Loc --      Pain Edu? --      Excl. in [place]?  --    No data found.  Updated Vital Signs BP (!) 145/73 (BP Location: Left Arm)   Pulse 70   Temp 98.2 F (36.8 C) (Oral)   Resp 18   Ht 5\' 9"  (1.753 m)   Wt 111.1 kg   SpO2 99%   BMI 36.18 kg/m   Visual Acuity Right Eye Distance:   Left Eye Distance:   Bilateral Distance:    Right Eye Near:   Left Eye Near:    Bilateral Near:  Physical Exam Vitals signs and nursing note reviewed.  Constitutional:      General: He is not in acute distress.    Appearance: He is well-developed. He is not toxic-appearing or diaphoretic.  HENT:     Head: Normocephalic and atraumatic.     Right Ear: Tympanic membrane, ear canal and external ear normal.     Left Ear: Tympanic membrane, ear canal and external ear normal.     Nose: Mucosal edema and rhinorrhea present.     Right Sinus: Maxillary sinus tenderness present.     Left Sinus: Maxillary sinus tenderness present.     Mouth/Throat:     Pharynx: Uvula midline. No oropharyngeal exudate.     Tonsils: No tonsillar abscesses.  Eyes:     General: No scleral icterus.       Right eye: No discharge.        Left eye: No discharge.     Conjunctiva/sclera: Conjunctivae normal.     Pupils: Pupils are equal, round, and reactive to light.  Neck:     Musculoskeletal: Normal range of motion and neck supple.     Thyroid: No thyromegaly.     Trachea: No tracheal deviation.  Cardiovascular:     Rate and Rhythm: Normal rate and regular rhythm.     Heart sounds: Normal heart sounds.  Pulmonary:     Effort: Pulmonary effort is normal. No respiratory distress.     Breath sounds: Normal breath sounds. No stridor. No wheezing or rales.  Chest:     Chest wall: No tenderness.  Lymphadenopathy:     Cervical: No cervical adenopathy.  Skin:    General: Skin is warm and dry.     Findings: No rash.  Neurological:     Mental Status: He is alert.      UC Treatments / Results  Labs (all labs ordered are listed, but only abnormal results are  displayed) Labs Reviewed - No data to display  EKG None  Radiology No results found.  Procedures Procedures (including critical care time)  Medications Ordered in UC Medications - No data to display  Initial Impression / Assessment and Plan / UC Course  I have reviewed the triage vital signs and the nursing notes.  Pertinent labs & imaging results that were available during my care of the patient were reviewed by me and considered in my medical decision making (see chart for details).      Final Clinical Impressions(s) / UC Diagnoses   Final diagnoses:  Acute maxillary sinusitis, recurrence not specified    ED Prescriptions    Medication Sig Dispense Auth. Provider   amoxicillin (AMOXIL) 875 MG tablet Take 1 tablet (875 mg total) by mouth 2 (two) times daily. 20 tablet Norval Gable, MD      1. diagnosis reviewed with patient 2. rx as per orders above; reviewed possible side effects, interactions, risks and benefits  3. Recommend supportive treatment with otc flonase  4. Follow-up prn if symptoms worsen or don't improve  Controlled Substance Prescriptions [place] Controlled Substance Registry consulted? Not Applicable   Norval Gable, MD 03/12/18 1414

## 2019-01-18 ENCOUNTER — Ambulatory Visit
Admission: EM | Admit: 2019-01-18 | Discharge: 2019-01-18 | Disposition: A | Discharge status: Home / Self Care | Payer: Medicare Other | Attending: Urgent Care | Admitting: Urgent Care

## 2019-01-18 ENCOUNTER — Encounter: Payer: Self-pay | Admitting: Emergency Medicine

## 2019-01-18 ENCOUNTER — Other Ambulatory Visit: Payer: Self-pay

## 2019-01-18 DIAGNOSIS — J329 Chronic sinusitis, unspecified: Secondary | ICD-10-CM

## 2019-01-18 MED ORDER — AMOXICILLIN-POT CLAVULANATE 875-125 MG PO TABS: 1.0000 | ORAL_TABLET | Freq: Two times a day (BID) | ORAL | 0 refills | Status: AC

## 2019-01-18 NOTE — Discharge Instructions (Signed)
It was very nice seeing you today in clinic. Thank you for entrusting me with your care.   Rest and increase fluid intake. Please utilize the medications that we discussed. Your prescriptions has been called in to your pharmacy. May use Tylenol and/or Ibuprofen as needed for pain/fever.  Make arrangements to follow up with your regular doctor in 1 week for re-evaluation if not improving. If your symptoms/condition worsens, please seek follow up care either here or in the ER. Please remember, our [place] providers are "right here with you" when you need [place].   Again, it was my pleasure to take care of you today. Thank you for choosing our clinic. I hope that you start to feel better quickly.   Honor Loh, MSN, APRN, FNP-C, CEN Advanced Practice Provider [facility]

## 2019-01-18 NOTE — ED Triage Notes (Signed)
Patient in today c/o >1 month history of sinus pressure/pain, ringing in his ears, dizziness and blurred vision. Patient states symptoms are off & on.

## 2019-01-19 NOTE — ED Provider Notes (Signed)
[place], [place]   Name: Josias Sandner DOB: Jun 24, 1950 MRN: [id number] CSN: [id number] PCP: Ardine Eng, MD  Arrival date and time:  01/18/19 1110  Chief Complaint:  Sinus Problem, Tinnitus, and Dizziness   NOTE: Prior to seeing the patient today, I have reviewed the triage nursing documentation and vital signs. Clinical staff has updated patient's PMH/PSHx, current medication list, and drug allergies/intolerances to ensure comprehensive history available to assist in medical decision making.   History:   HPI: Teriq Rabon. is a 68 y.o. male who presents today with complaints of intermittent sinus symptoms for the last month. Patient advises that symptoms have been worse for the last week or so. He complains of dizziness, cough (non-productive), congestion, generalized headaches, and pressure behind his eyes. Patient has throat irritation related to sinus drainage. He denies otalgia, but describes a "buzzing" sound in his ears.He denies any associated fevers. He denies that he has experienced any nausea, vomiting, diarrhea, or abdominal pain. He is eating and drinking well. Patient denies any perceived alterations to his sense of taste or smell.  Patient denies being in close contact with anyone known to be ill. He has never been tested for SARS-CoV-2 (novel coronavirus) per his report and does not wish to be tested today. Patient has tried several over the counter interventions for his symptoms, however he notes that they are only intermittently effective.   Past Medical History:  Diagnosis Date  . Arthritis   . BPH (benign prostatic hyperplasia)   . Cancer ([place]) 2015   Prostate CA  . Cancer [facility]) 1996   left Kidney  . GERD (gastroesophageal reflux disease)   . High cholesterol   . History of kidney stones    left kidney several times  . Hypertension   . Pre-diabetes   . Sleep apnea   . Vitamin D deficiency     Past Surgical History:  Procedure Laterality Date  .  COLONOSCOPY WITH PROPOFOL N/A 09/09/2016   Procedure: COLONOSCOPY WITH PROPOFOL;  Surgeon: Lollie Sails, MD;  Location[facility] ENDOSCOPY;  Service: Endoscopy;  Laterality: N/A;  . ESOPHAGOGASTRODUODENOSCOPY (EGD) WITH PROPOFOL N/A 01/13/2018   Procedure: ESOPHAGOGASTRODUODENOSCOPY (EGD) WITH PROPOFOL;  Surgeon: [name], MD;  Location: [facility] ENDOSCOPY;  Service: Gastroenterology;  Laterality: N/A;  . Partial kidney resection  1996  . Prostate seed implantation  2015  . TOTAL KNEE ARTHROPLASTY Left 02/19/2017   Procedure: TOTAL KNEE ARTHROPLASTY;  Surgeon: Hessie Knows, MD;  Location: [facility] ORS;  Service: Orthopedics;  Laterality: Left;  . TOTAL KNEE ARTHROPLASTY Left 04/07/2017   Procedure: MEDIAL RETINACULAR REPAIR;  Surgeon: Hessie Knows, MD;  Location: [facility] ORS;  Service: Orthopedics;  Laterality: Left;    Family History  Problem Relation Age of Onset  . Colon cancer Mother   . Lung cancer Father   . Benign prostatic hyperplasia Father     Social History   Tobacco Use  . Smoking status: Never Smoker  . Smokeless tobacco: Never Used  Substance Use Topics  . Alcohol use: Yes    Comment: rarely  . Drug use: No    Patient Active Problem List   Diagnosis Date Noted  . Patellar dislocation 04/07/2017  . Primary localized osteoarthritis of left knee 02/19/2017  . Hyperlipidemia 11/18/2016  . Essential hypertension 11/18/2016  . Bilateral lower extremity edema 11/18/2016  . Absent pedal pulses 11/18/2016  . Vertigo 01/30/2015    Home Medications:    Current Meds  Medication Sig  .  acetaminophen (TYLENOL) 500 MG tablet Take 500 mg by mouth 2 (two) times daily.   Marland Kitchen amLODipine (NORVASC) 10 MG tablet Take 10 mg by mouth at bedtime.   Marland Kitchen aspirin EC 81 MG tablet Take 81 mg by mouth daily.  Marland Kitchen atorvastatin (LIPITOR) 80 MG tablet Take 80 mg by mouth at bedtime.   . docusate sodium (COLACE) 100 MG capsule Take 200 mg by mouth daily.  . DULoxetine (CYMBALTA) 60 MG capsule TAKE 1  CAPSULE BY MOUTH ONCE DAILY FOR MOOD AND PAIN  . fluticasone (FLONASE) 50 MCG/ACT nasal spray Place 1 spray into both nostrils daily as needed for allergies.   . furosemide (LASIX) 20 MG tablet Take 20 mg by mouth daily. In  Am.  . loratadine (CLARITIN) 10 MG tablet Take 1 tablet (10 mg total) by mouth daily.  Marland Kitchen losartan-hydrochlorothiazide (HYZAAR) 100-25 MG tablet Take 1 tablet by mouth daily.  . meclizine (ANTIVERT) 25 MG tablet Take 1 tablet (25 mg total) by mouth 2 (two) times daily as needed for dizziness.  . nitroGLYCERIN (NITROSTAT) 0.4 MG SL tablet Place 0.4 mg under the tongue every 5 (five) minutes as needed for chest pain.  Marland Kitchen omeprazole (PRILOSEC) 40 MG capsule Take 40 mg by mouth daily.   . polyethylene glycol (MIRALAX / GLYCOLAX) packet Take 17 g by mouth daily as needed for mild constipation.   . tamsulosin (FLOMAX) 0.4 MG CAPS capsule Take 0.4 mg by mouth daily.    Allergies:   Patient has no known allergies.  Review of Systems (ROS): Review of Systems  Constitutional: Negative for fatigue and fever.  HENT: Positive for congestion, postnasal drip, sinus pressure and sore throat. Negative for ear pain, rhinorrhea, sinus pain and sneezing.   Eyes: Negative for pain, discharge, redness and visual disturbance.  Respiratory: Positive for cough. Negative for chest tightness and shortness of breath.   Cardiovascular: Negative for chest pain and palpitations.  Gastrointestinal: Negative for abdominal pain, diarrhea, nausea and vomiting.  Musculoskeletal: Negative for arthralgias, back pain, myalgias and neck pain.  Skin: Negative for color change, pallor and rash.  Neurological: Positive for headaches. Negative for dizziness, syncope and weakness.  Hematological: Negative for adenopathy.     Vital Signs: Today's Vitals   01/18/19 1202 01/18/19 1203 01/18/19 1253  BP:  (!) 144/78   Pulse:  63   Resp:  18   Temp:  98 F (36.7 C)   TempSrc:  Oral   SpO2:  100%   Weight:  (!) 345 lb (156.5 kg)    Height: 5\' 9"  (1.753 m)    PainSc: 0-No pain  0-No pain    Physical Exam: Physical Exam  Constitutional: He is oriented to person, place, and time and well-developed, well-nourished, and in no distress. No distress.  HENT:  Head: Normocephalic and atraumatic.  Right Ear: No drainage or tenderness. Tympanic membrane is not erythematous. A middle ear effusion is present.  Left Ear: No drainage or tenderness. Tympanic membrane is not erythematous. A middle ear effusion is present.  Nose: Mucosal edema and sinus tenderness present.  Mouth/Throat: Uvula is midline and mucous membranes are normal. Posterior oropharyngeal erythema present. No oropharyngeal exudate or posterior oropharyngeal edema.  Eyes: Pupils are equal, round, and reactive to light. Conjunctivae and EOM are normal.  Neck: Normal range of motion. Neck supple.  Cardiovascular: Normal rate, regular rhythm, normal heart sounds and intact distal pulses. Exam reveals no gallop and no friction rub.  No murmur heard. Pulmonary/Chest: Effort normal.  No respiratory distress. He has no decreased breath sounds. He has no wheezes. He has no rhonchi. He has no rales.  Abdominal: Soft. Normal appearance and bowel sounds are normal. He exhibits no distension. There is no abdominal tenderness.  Musculoskeletal: Normal range of motion.  Lymphadenopathy:       Head (right side): Submandibular adenopathy present.  Neurological: He is alert and oriented to person, place, and time. Gait normal.  Skin: Skin is warm and dry. No rash noted. He is not diaphoretic.  Psychiatric: Mood, memory, affect and judgment normal.  Nursing note and vitals reviewed.   Urgent Care Treatments / Results:   LABS: PLEASE NOTE: all labs that were ordered this encounter are listed, however only abnormal results are displayed. Labs Reviewed - No data to display  EKG: -None  RADIOLOGY: No results found.  PROCEDURES: Procedures   MEDICATIONS RECEIVED THIS VISIT: Medications - No data to display  PERTINENT CLINICAL COURSE NOTES/UPDATES:   Initial Impression / Assessment and Plan / Urgent Care Course:  Pertinent labs & imaging results that were available during my care of the patient were personally reviewed by me and considered in my medical decision making (see lab/imaging section of note for values and interpretations).  OLEE DICLEMENTE Sr. is a 68 y.o. male who presents to [facility] today with complaints of Sinus Problem, Tinnitus, and Dizziness   Patient is well appearing overall in clinic today. He does not appear to be in any acute distress. Presenting symptoms (see HPI) and exam as documented above. Refuses SARS-CoV-2 testing today. Given the chronicity of his symptoms and failed conservative management, will proceed with treatment for sinusitis using a 10 day course of amoxicillin-clavulanate. Discussed supportive care measures at home during acute phase of illness. Patient to rest as much as possible. He was encouraged to ensure adequate hydration (water and ORS) to prevent dehydration and electrolyte derangements. Patient may use APAP and/or IBU on an as needed basis for pain/fever.   Discussed follow up with primary care physician in 1 week for re-evaluation. I have reviewed the follow up and strict return precautions for any new or worsening symptoms. Patient is aware of symptoms that would be deemed urgent/emergent, and would thus require further evaluation either here or in the emergency department. At the time of discharge, he verbalized understanding and consent with the discharge plan as it was reviewed with him. All questions were fielded by provider and/or clinic staff prior to patient discharge.    Final Clinical Impressions / Urgent Care Diagnoses:   Final diagnoses:  Sinusitis, unspecified chronicity, unspecified location    New Prescriptions:  [place] Controlled Substance Registry consulted? Not  Applicable  Meds ordered this encounter  Medications  . amoxicillin-clavulanate (AUGMENTIN) 875-125 MG tablet    Sig: Take 1 tablet by mouth 2 (two) times daily for 10 days.    Dispense:  20 tablet    Refill:  0    Recommended Follow up Care:  Patient encouraged to follow up with the following provider within the specified time frame, or sooner as dictated by the severity of his symptoms. As always, he was instructed that for any urgent/emergent care needs, he should seek care either here or in the emergency department for more immediate evaluation.  Follow-up Information    Ashkin, Neldon Labella, MD In 1 week.   Specialty: Family Medicine Why: General reassessment of symptoms if not improving Contact information: [address] [place] [address] [phone]  NOTE: This note was prepared using Lobbyist along with smaller Company secretary. Despite my best ability to proofread, there is the potential that transcriptional errors may still occur from this process, and are completely unintentional.    Karen Kitchens, NP 01/19/19 [phone]

## 2019-08-15 ENCOUNTER — Ambulatory Visit
Admission: EM | Admit: 2019-08-15 | Discharge: 2019-08-15 | Disposition: A | Discharge status: Home / Self Care | Payer: Medicare Other | Attending: Family Medicine | Admitting: Family Medicine

## 2019-08-15 ENCOUNTER — Other Ambulatory Visit: Payer: Self-pay

## 2019-08-15 ENCOUNTER — Encounter: Payer: Self-pay | Admitting: Emergency Medicine

## 2019-08-15 DIAGNOSIS — R5383 Other fatigue: Secondary | ICD-10-CM | POA: Diagnosis not present

## 2019-08-15 DIAGNOSIS — R110 Nausea: Secondary | ICD-10-CM | POA: Diagnosis not present

## 2019-08-15 MED ORDER — ONDANSETRON HCL 4 MG PO TABS: 4.0000 mg | ORAL_TABLET | Freq: Three times a day (TID) | ORAL | 0 refills | Status: DC | PRN

## 2019-08-15 NOTE — ED Triage Notes (Signed)
Pt c/o nausea and lightheaded. He states he feels like he is getting a sinus infection. He states this has been intermittently for years. He states he has been to [facility] before and given abx and was better.  He later states he has cough, post nasal drainage.

## 2019-08-15 NOTE — Discharge Instructions (Signed)
Nausea medicine if needed.  Follow up with your physician.  Take care  Dr. Lacinda Axon

## 2019-08-15 NOTE — ED Provider Notes (Signed)
[facility]    CSN: [id number] Arrival date & time: 08/15/19  1332      History   Chief Complaint Chief Complaint  Patient presents with  . Nausea  . Dizziness   HPI  69-year-old male presents with multiple complaints.  Patient reports ongoing fatigue, lightheadedness, nausea.  Patient recently mid to the hospital for similar symptoms and for bradycardia.  Work-up unremarkable.  Patient reports that he has had the symptoms for years and they come and go.  States that his symptoms were particularly bad yesterday.  He feels much better today.  No fever.  Does report a decrease in appetite at times.  No known relieving factors.  No other complaints at this time.  Of note, patient is concerned that he may have a sinus infection.  Past Medical History:  Diagnosis Date  . Arthritis   . BPH (benign prostatic hyperplasia)   . Cancer ([place]) 2015   Prostate CA  . Cancer [facility]) 1996   left Kidney  . GERD (gastroesophageal reflux disease)   . High cholesterol   . History of kidney stones    left kidney several times  . Hypertension   . Pre-diabetes   . Sleep apnea   . Vitamin D deficiency     Patient Active Problem List   Diagnosis Date Noted  . Patellar dislocation 04/07/2017  . Primary localized osteoarthritis of left knee 02/19/2017  . Hyperlipidemia 11/18/2016  . Essential hypertension 11/18/2016  . Bilateral lower extremity edema 11/18/2016  . Absent pedal pulses 11/18/2016  . Vertigo 01/30/2015    Past Surgical History:  Procedure Laterality Date  . COLONOSCOPY WITH PROPOFOL N/A 09/09/2016   Procedure: COLONOSCOPY WITH PROPOFOL;  Surgeon: Lollie Sails, MD;  Location: [facility] ENDOSCOPY;  Service: Endoscopy;  Laterality: N/A;  . ESOPHAGOGASTRODUODENOSCOPY (EGD) WITH PROPOFOL N/A 01/13/2018   Procedure: ESOPHAGOGASTRODUODENOSCOPY (EGD) WITH PROPOFOL;  Surgeon: [name], MD;  Location: [facility] ENDOSCOPY;  Service: Gastroenterology;  Laterality: N/A;  .  Partial kidney resection  1996  . Prostate seed implantation  2015  . TOTAL KNEE ARTHROPLASTY Left 02/19/2017   Procedure: TOTAL KNEE ARTHROPLASTY;  Surgeon: Hessie Knows, MD;  Location: [facility] ORS;  Service: Orthopedics;  Laterality: Left;  . TOTAL KNEE ARTHROPLASTY Left 04/07/2017   Procedure: MEDIAL RETINACULAR REPAIR;  Surgeon: Hessie Knows, MD;  Location: [facility] ORS;  Service: Orthopedics;  Laterality: Left;       Home Medications    Prior to Admission medications   Medication Sig Start Date End Date Taking? Authorizing Provider  acetaminophen (TYLENOL) 500 MG tablet Take 500 mg by mouth 2 (two) times daily.    Yes [provider]  amLODipine (NORVASC) 10 MG tablet Take 10 mg by mouth at bedtime.    Yes [provider]  aspirin EC 81 MG tablet Take 81 mg by mouth daily.   Yes [provider]  atorvastatin (LIPITOR) 80 MG tablet Take 80 mg by mouth at bedtime.  11/12/16  Yes [provider]  docusate sodium (COLACE) 100 MG capsule Take 200 mg by mouth daily.   Yes [provider]  DULoxetine (CYMBALTA) 60 MG capsule TAKE 1 CAPSULE BY MOUTH ONCE DAILY FOR MOOD AND PAIN 03/05/18  Yes [provider]  fluticasone (FLONASE) 50 MCG/ACT nasal spray Place 1 spray into both nostrils daily as needed for allergies.    Yes [provider]  furosemide (LASIX) 20 MG tablet Take 20 mg by mouth daily. In  Am.  Yes [provider]  loratadine (CLARITIN) 10 MG tablet Take 1 tablet (10 mg total) by mouth daily. 02/01/15  Yes Fritzi Mandes, MD  losartan-hydrochlorothiazide (HYZAAR) 100-25 MG tablet Take 1 tablet by mouth daily.   Yes [provider]  nitroGLYCERIN (NITROSTAT) 0.4 MG SL tablet Place 0.4 mg under the tongue every 5 (five) minutes as needed for chest pain.   Yes [provider]  omeprazole (PRILOSEC) 40 MG capsule Take 40 mg by mouth daily.    Yes [provider]  polyethylene glycol (MIRALAX /  GLYCOLAX) packet Take 17 g by mouth daily as needed for mild constipation.    Yes [provider]  tamsulosin (FLOMAX) 0.4 MG CAPS capsule Take 0.4 mg by mouth daily.   Yes [provider]  meclizine (ANTIVERT) 25 MG tablet Take 1 tablet (25 mg total) by mouth 2 (two) times daily as needed for dizziness. 02/01/15   Fritzi Mandes, MD  ondansetron (ZOFRAN) 4 MG tablet Take 1 tablet (4 mg total) by mouth every 8 (eight) hours as needed for nausea or vomiting. 08/15/19   Coral Spikes, DO  Tdap Durwin Reges) 5-2.5-18.5 LF-MCG/0.5 injection  07/31/14   [provider]    Family History Family History  Problem Relation Age of Onset  . Colon cancer Mother   . Lung cancer Father   . Benign prostatic hyperplasia Father     Social History Social History   Tobacco Use  . Smoking status: Never Smoker  . Smokeless tobacco: Never Used  Vaping Use  . Vaping Use: Never used  Substance Use Topics  . Alcohol use: Yes    Comment: rarely  . Drug use: No     Allergies   Patient has no known allergies.   Review of Systems Review of Systems  Constitutional: Positive for fatigue.  Gastrointestinal: Positive for nausea.  Neurological: Positive for light-headedness.   Physical Exam Triage Vital Signs ED Triage Vitals  Enc Vitals Group     BP 08/15/19 1445 140/73     Pulse Rate 08/15/19 1445 (!) 59     Resp 08/15/19 1445 18     Temp 08/15/19 1445 98.2 F (36.8 C)     Temp Source 08/15/19 1445 Oral     SpO2 08/15/19 1445 100 %     Weight 08/15/19 1442 (!) 345 lb 0.3 oz (156.5 kg)     Height 08/15/19 1442 5\' 10"  (1.778 m)     Head Circumference --      Peak Flow --      Pain Score 08/15/19 1441 0     Pain Loc --      Pain Edu? --      Excl. in [place]? --    Updated Vital Signs BP 140/73 (BP Location: Right Arm)   Pulse (!) 59   Temp 98.2 F (36.8 C) (Oral)   Resp 18   Ht 5\' 10"  (1.778 m)   Wt (!) 156.5 kg   SpO2 100%   BMI 49.51 kg/m   Visual Acuity Right  Eye Distance:   Left Eye Distance:   Bilateral Distance:    Right Eye Near:   Left Eye Near:    Bilateral Near:     Physical Exam Vitals and nursing note reviewed.  Constitutional:      General: He is not in acute distress.    Appearance: Normal appearance. He is obese. He is not ill-appearing.  HENT:     Head: Normocephalic and atraumatic.  Right Ear: Tympanic membrane normal.     Left Ear: Tympanic membrane normal.  Eyes:     General:        Right eye: No discharge.        Left eye: No discharge.     Conjunctiva/sclera: Conjunctivae normal.  Cardiovascular:     Rate and Rhythm: Regular rhythm. Bradycardia present.     Heart sounds: No murmur heard.   Pulmonary:     Effort: Pulmonary effort is normal.     Breath sounds: Normal breath sounds. No wheezing, rhonchi or rales.  Neurological:     Mental Status: He is alert.  Psychiatric:        Mood and Affect: Mood normal.        Behavior: Behavior normal.    UC Treatments / Results  Labs (all labs ordered are listed, but only abnormal results are displayed) Labs Reviewed - No data to display  EKG   Radiology No results found.  Procedures Procedures (including critical care time)  Medications Ordered in UC Medications - No data to display  Initial Impression / Assessment and Plan / UC Course  I have reviewed the triage vital signs and the nursing notes.  Pertinent labs & imaging results that were available during my care of the patient were reviewed by me and considered in my medical decision making (see chart for details).    69 year old male presents with ongoing lightheadedness, fatigue, and nausea.  These are chronic issues.  He is well-appearing and has had a recent admission to the hospital with work-up.  Advised Zofran as needed.  Follow-up with his regular physician.  Final Clinical Impressions(s) / UC Diagnoses   Final diagnoses:  Fatigue, unspecified type  Nausea     Discharge Instructions      Nausea medicine if needed.  Follow up with your physician.  Take care  Dr. Lacinda Axon    ED Prescriptions    Medication Sig Dispense Auth. Provider   ondansetron (ZOFRAN) 4 MG tablet Take 1 tablet (4 mg total) by mouth every 8 (eight) hours as needed for nausea or vomiting. 20 tablet Coral Spikes, DO     PDMP not reviewed this encounter.   Coral Spikes, [place] 08/15/19 2043

## 2020-06-13 ENCOUNTER — Emergency Department: Payer: Medicare HMO

## 2020-06-13 ENCOUNTER — Emergency Department
Admission: EM | Admit: 2020-06-13 | Discharge: 2020-06-13 | Disposition: A | Discharge status: Home / Self Care | Payer: Medicare HMO | Attending: Emergency Medicine | Admitting: Emergency Medicine

## 2020-06-13 ENCOUNTER — Other Ambulatory Visit: Payer: Self-pay

## 2020-06-13 DIAGNOSIS — R7303 Prediabetes: Secondary | ICD-10-CM | POA: Insufficient documentation

## 2020-06-13 DIAGNOSIS — Z79899 Other long term (current) drug therapy: Secondary | ICD-10-CM | POA: Diagnosis not present

## 2020-06-13 DIAGNOSIS — Z20822 Contact with and (suspected) exposure to covid-19: Secondary | ICD-10-CM | POA: Insufficient documentation

## 2020-06-13 DIAGNOSIS — R001 Bradycardia, unspecified: Secondary | ICD-10-CM | POA: Insufficient documentation

## 2020-06-13 DIAGNOSIS — R531 Weakness: Secondary | ICD-10-CM | POA: Insufficient documentation

## 2020-06-13 DIAGNOSIS — R079 Chest pain, unspecified: Secondary | ICD-10-CM

## 2020-06-13 DIAGNOSIS — I10 Essential (primary) hypertension: Secondary | ICD-10-CM | POA: Diagnosis not present

## 2020-06-13 DIAGNOSIS — R1013 Epigastric pain: Secondary | ICD-10-CM | POA: Insufficient documentation

## 2020-06-13 DIAGNOSIS — Z7982 Long term (current) use of aspirin: Secondary | ICD-10-CM | POA: Insufficient documentation

## 2020-06-13 DIAGNOSIS — R072 Precordial pain: Secondary | ICD-10-CM | POA: Insufficient documentation

## 2020-06-13 DIAGNOSIS — Z8546 Personal history of malignant neoplasm of prostate: Secondary | ICD-10-CM | POA: Diagnosis not present

## 2020-06-13 LAB — CBC WITH DIFFERENTIAL/PLATELET
Abs Immature Granulocytes: 0.02 10*3/uL (ref 0.00–0.07)
Basophils Absolute: 0 10*3/uL (ref 0.0–0.1)
Basophils Relative: 0 %
Eosinophils Absolute: 0 10*3/uL (ref 0.0–0.5)
Eosinophils Relative: 0 %
HCT: 35.6 % — ABNORMAL LOW (ref 39.0–52.0)
Hemoglobin: 11.7 g/dL — ABNORMAL LOW (ref 13.0–17.0)
Immature Granulocytes: 0 %
Lymphocytes Relative: 20 %
Lymphs Abs: 1.2 10*3/uL (ref 0.7–4.0)
MCH: 32 pg (ref 26.0–34.0)
MCHC: 32.9 g/dL (ref 30.0–36.0)
MCV: 97.3 fL (ref 80.0–100.0)
Monocytes Absolute: 0.6 10*3/uL (ref 0.1–1.0)
Monocytes Relative: 10 %
Neutro Abs: 4.5 10*3/uL (ref 1.7–7.7)
Neutrophils Relative %: 70 %
Platelets: 224 10*3/uL (ref 150–400)
RBC: 3.66 MIL/uL — ABNORMAL LOW (ref 4.22–5.81)
RDW: 14.7 % (ref 11.5–15.5)
WBC: 6.3 10*3/uL (ref 4.0–10.5)
nRBC: 0 % (ref 0.0–0.2)

## 2020-06-13 LAB — COMPREHENSIVE METABOLIC PANEL
ALT: 16 U/L (ref 0–44)
AST: 22 U/L (ref 15–41)
Albumin: 3.7 g/dL (ref 3.5–5.0)
Alkaline Phosphatase: 71 U/L (ref 38–126)
Anion gap: 10 (ref 5–15)
BUN: 17 mg/dL (ref 8–23)
CO2: 24 mmol/L (ref 22–32)
Calcium: 9.2 mg/dL (ref 8.9–10.3)
Chloride: 104 mmol/L (ref 98–111)
Creatinine, Ser: 1.38 mg/dL — ABNORMAL HIGH (ref 0.61–1.24)
GFR, Estimated: 55 mL/min — ABNORMAL LOW (ref >60)
Glucose, Bld: 103 mg/dL — ABNORMAL HIGH (ref 70–99)
Potassium: 4.5 mmol/L (ref 3.5–5.1)
Sodium: 138 mmol/L (ref 135–145)
Total Bilirubin: 0.6 mg/dL (ref 0.3–1.2)
Total Protein: 7.3 g/dL (ref 6.5–8.1)

## 2020-06-13 LAB — RESP PANEL BY RT-PCR (FLU A&B, COVID) ARPGX2
Influenza A by PCR: NEGATIVE
Influenza B by PCR: NEGATIVE
SARS Coronavirus 2 by RT PCR: NEGATIVE

## 2020-06-13 LAB — TSH: TSH: 1.384 u[IU]/mL (ref 0.350–4.500)

## 2020-06-13 LAB — TROPONIN I (HIGH SENSITIVITY)
Troponin I (High Sensitivity): 8 ng/L (ref <18)
Troponin I (High Sensitivity): 8 ng/L

## 2020-06-13 MED ORDER — IOHEXOL 350 MG/ML SOLN
100.0000 mL | Freq: Once | INTRAVENOUS | Status: AC | PRN
  Administered 2020-06-13: 100 mL via INTRAVENOUS

## 2020-06-13 NOTE — ED Triage Notes (Addendum)
Pt to ER via POV with complaints of epigastric pain/ discomfort intermittently for a several months. No radiation. Reports that today he started feeling badly around 9am and discomfort has not been relieved by rest. Denies feeling worse after eating. Also reports ongoing fatigue that feels like he's "ran a marathon". Denies NVD/ urinary symptoms.

## 2020-06-13 NOTE — ED Notes (Signed)
Pt alert  Sinus brady on monitor.  Iv in place  Family with pt

## 2020-06-13 NOTE — ED Notes (Signed)
Pt reports feeling bad for years and no one can figure out what is wrong.  Pt states he feels tired.  Pt has chest pain.  No sob.  No n/v/d    Pt alert  Speech clear.

## 2020-06-13 NOTE — ED Notes (Signed)
Dr smith in with pt and family  

## 2020-06-13 NOTE — ED Provider Notes (Signed)
[facility] Emergency Department Provider Note  ____________________________________________   Event Date/Time   First MD Initiated Contact with Patient 06/13/20 1649     (approximate)  I have reviewed the triage vital signs and the nursing notes.   HISTORY  Chief Complaint Chest Pain    HPI Colin MENDENHALL Sr. is a 70 y.o. male presents emergency department complaint epigastric pain/midsternal chest pain and discomfort.  Patient states he has had some intermittent discomfort for several months.  States his cardiologist in [place] has done on a stress test and echo.  He has not had a cardiac catheterization.  Patient states he feels like "he ran a marathon without running a marathon" states he just stays exhausted all the time.  Symptoms today started around 9 AM, the discomfort was alleviated by rest.  Pain does not radiate through to his back.  He does not become diaphoretic.    Past Medical History:  Diagnosis Date  . Arthritis   . BPH (benign prostatic hyperplasia)   . Cancer ([place]) 2015   Prostate CA  . Cancer [facility]) 1996   left Kidney  . GERD (gastroesophageal reflux disease)   . High cholesterol   . History of kidney stones    left kidney several times  . Hypertension   . Pre-diabetes   . Sleep apnea   . Vitamin D deficiency     Patient Active Problem List   Diagnosis Date Noted  . Patellar dislocation 04/07/2017  . Primary localized osteoarthritis of left knee 02/19/2017  . Hyperlipidemia 11/18/2016  . Essential hypertension 11/18/2016  . Bilateral lower extremity edema 11/18/2016  . Absent pedal pulses 11/18/2016  . Vertigo 01/30/2015    Past Surgical History:  Procedure Laterality Date  . COLONOSCOPY WITH PROPOFOL N/A 09/09/2016   Procedure: COLONOSCOPY WITH PROPOFOL;  Surgeon: Lollie Sails, MD;  Location: [facility] ENDOSCOPY;  Service: Endoscopy;  Laterality: N/A;  . ESOPHAGOGASTRODUODENOSCOPY (EGD) WITH PROPOFOL N/A 01/13/2018    Procedure: ESOPHAGOGASTRODUODENOSCOPY (EGD) WITH PROPOFOL;  Surgeon: [name], MD;  Location: [facility] ENDOSCOPY;  Service: Gastroenterology;  Laterality: N/A;  . Partial kidney resection  1996  . Prostate seed implantation  2015  . TOTAL KNEE ARTHROPLASTY Left 02/19/2017   Procedure: TOTAL KNEE ARTHROPLASTY;  Surgeon: Hessie Knows, MD;  Location: [facility] ORS;  Service: Orthopedics;  Laterality: Left;  . TOTAL KNEE ARTHROPLASTY Left 04/07/2017   Procedure: MEDIAL RETINACULAR REPAIR;  Surgeon: Hessie Knows, MD;  Location: [facility] ORS;  Service: Orthopedics;  Laterality: Left;    Prior to Admission medications   Medication Sig Start Date End Date Taking? Authorizing Provider  acetaminophen (TYLENOL) 500 MG tablet Take 500 mg by mouth 2 (two) times daily.     [provider]  amLODipine (NORVASC) 10 MG tablet Take 10 mg by mouth at bedtime.     [provider]  aspirin EC 81 MG tablet Take 81 mg by mouth daily.    [provider]  atorvastatin (LIPITOR) 80 MG tablet Take 80 mg by mouth at bedtime.  11/12/16   [provider]  docusate sodium (COLACE) 100 MG capsule Take 200 mg by mouth daily.    [provider]  DULoxetine (CYMBALTA) 60 MG capsule TAKE 1 CAPSULE BY MOUTH ONCE DAILY FOR MOOD AND PAIN 03/05/18   [provider]  fluticasone (FLONASE) 50 MCG/ACT nasal spray Place 1 spray into both nostrils daily as needed for allergies.     [provider]  furosemide (LASIX) 20  MG tablet Take 20 mg by mouth daily. In  Am.    [provider]  loratadine (CLARITIN) 10 MG tablet Take 1 tablet (10 mg total) by mouth daily. 02/01/15   Fritzi Mandes, MD  losartan-hydrochlorothiazide (HYZAAR) 100-25 MG tablet Take 1 tablet by mouth daily.    [provider]  meclizine (ANTIVERT) 25 MG tablet Take 1 tablet (25 mg total) by mouth 2 (two) times daily as needed for dizziness. 02/01/15   Fritzi Mandes, MD  nitroGLYCERIN (NITROSTAT) 0.4 MG SL  tablet Place 0.4 mg under the tongue every 5 (five) minutes as needed for chest pain.    [provider]  omeprazole (PRILOSEC) 40 MG capsule Take 40 mg by mouth daily.     [provider]  ondansetron (ZOFRAN) 4 MG tablet Take 1 tablet (4 mg total) by mouth every 8 (eight) hours as needed for nausea or vomiting. 08/15/19   Coral Spikes, DO  polyethylene glycol (MIRALAX / GLYCOLAX) packet Take 17 g by mouth daily as needed for mild constipation.     [provider]  tamsulosin (FLOMAX) 0.4 MG CAPS capsule Take 0.4 mg by mouth daily.    [provider]  Tdap Durwin Reges) 5-2.5-18.5 LF-MCG/0.5 injection  07/31/14   [provider]    Allergies Patient has no known allergies.  Family History  Problem Relation Age of Onset  . Colon cancer Mother   . Lung cancer Father   . Benign prostatic hyperplasia Father     Social History Social History   Tobacco Use  . Smoking status: Never Smoker  . Smokeless tobacco: Never Used  Vaping Use  . Vaping Use: Never used  Substance Use Topics  . Alcohol use: Yes    Comment: rarely  . Drug use: No    Review of Systems  Constitutional: No fever/chills Eyes: No visual changes. ENT: No sore throat. Respiratory: Denies cough Cardiovascular: Positive chest pain Gastrointestinal: Denies abdominal pain Genitourinary: Negative for dysuria. Musculoskeletal: Negative for back pain. Skin: Negative for rash. Psychiatric: no mood changes,     ____________________________________________   PHYSICAL EXAM:  VITAL SIGNS: ED Triage Vitals  Enc Vitals Group     BP 06/13/20 1644 132/80     Pulse Rate 06/13/20 1644 65     Resp 06/13/20 1644 20     Temp 06/13/20 1644 98.4 F (36.9 C)     Temp Source 06/13/20 1644 Oral     SpO2 06/13/20 1644 96 %     Weight 06/13/20 1641 (!) 350 lb (158.8 kg)     Height 06/13/20 1641 5\' 10"  (1.778 m)     Head Circumference --      Peak Flow --      Pain Score 06/13/20  1641 8     Pain Loc --      Pain Edu? --      Excl. in [place]? --     Constitutional: Alert and oriented. Well appearing and in no acute distress. Eyes: Conjunctivae are normal.  Head: Atraumatic. Nose: No congestion/rhinnorhea. Mouth/Throat: Mucous membranes are moist.   Neck:  supple no lymphadenopathy noted Cardiovascular: Normal rate, regular rhythm. Heart sounds are normal Respiratory: Normal respiratory effort.  No retractions, lungs c t a  Abd: soft nontender bs normal all 4 quad GU: deferred Musculoskeletal: FROM all extremities, warm and well perfused Neurologic:  Normal speech and language.  Skin:  Skin is warm, dry and intact. No rash noted. Psychiatric: Mood and affect are  normal. Speech and behavior are normal.  ____________________________________________   LABS (all labs ordered are listed, but only abnormal results are displayed)  Labs Reviewed  COMPREHENSIVE METABOLIC PANEL - Abnormal; Notable for the following components:      Result Value   Glucose, Bld 103 (*)    Creatinine, Ser 1.38 (*)    GFR, Estimated 55 (*)    All other components within normal limits  CBC WITH DIFFERENTIAL/PLATELET - Abnormal; Notable for the following components:   RBC 3.66 (*)    Hemoglobin 11.7 (*)    HCT 35.6 (*)    All other components within normal limits  RESP PANEL BY RT-PCR (FLU A&B, COVID) ARPGX2  TSH  T3  T4  TROPONIN I (HIGH SENSITIVITY)  TROPONIN I (HIGH SENSITIVITY)   ____________________________________________   ____________________________________________  RADIOLOGY  Chest x-ray  ____________________________________________   PROCEDURES  Procedure(s) performed: No  Procedures    ____________________________________________   INITIAL IMPRESSION / ASSESSMENT AND PLAN / ED COURSE  Pertinent labs & imaging results that were available during my care of the patient were reviewed by me and considered in my medical decision making (see chart for  details).   Patient is a 70 year old male presents with chest discomfort and fatigue.  See HPI.  Physical exam shows patient appears stable  DDx: MI, angina, CHF, decreased ejection fraction, pancreatitis, hypothyroidism, vitamin deficiency or electrolyte disturbance   CBC has decreased H&H, TSH is normal, respiratory panel for COVID and influenza is negative, comprehensive metabolic panel is normal, troponins normal  Chest x-ray shows questionable infiltrates  CTA of the chest due to chest pain  Patient remains bradycardic.  This could be part of his fatigue.  I did also discuss with the patient that he needs to follow-up with his cardiologist and consider having a cardiac cath.  Leaving him this is the gold standard to rule out heart disease.  Care is being transferred to Dr. Tamala Julian at this time  Colin ZAYED Sr. was evaluated in Emergency Department on 06/13/2020 for the symptoms described in the history of present illness. He was evaluated in the context of the global COVID-19 pandemic, which necessitated consideration that the patient might be at risk for infection with the SARS-CoV-2 virus that causes COVID-19. Institutional protocols and algorithms that pertain to the evaluation of patients at risk for COVID-19 are in a state of rapid change based on information released by regulatory bodies including the CDC and federal and state organizations. These policies and algorithms were followed during the patient's care in the ED.    As part of my medical decision making, I reviewed the following data within the [place] History obtained from family, Nursing notes reviewed and incorporated, Labs reviewed , EKG interpreted bradycardia, Old chart reviewed, Radiograph reviewed , Evaluated by EM attending , Notes from prior ED visits and [place] Controlled Substance Database  ____________________________________________   FINAL CLINICAL IMPRESSION(S) / ED DIAGNOSES  Final diagnoses:   Nonspecific chest pain  Weakness      NEW MEDICATIONS STARTED DURING THIS VISIT:  New Prescriptions   No medications on file     Note:  This document was prepared using Dragon voice recognition software and may include unintentional dictation errors.    Versie Starks, PA-C 06/13/20 1958    Vladimir Crofts, MD 06/14/20 [phone]

## 2020-06-13 NOTE — ED Notes (Signed)
Pt in ct scan  

## 2020-06-14 LAB — T3: T3, Total: 118 ng/dL (ref 71–180)

## 2020-06-14 LAB — T4: T4, Total: 7.2 ug/dL (ref 4.5–12.0)

## 2020-10-24 ENCOUNTER — Ambulatory Visit
Admission: EM | Admit: 2020-10-24 | Discharge: 2020-10-24 | Disposition: A | Discharge status: Home / Self Care | Payer: Medicare HMO | Attending: Physician Assistant | Admitting: Physician Assistant

## 2020-10-24 ENCOUNTER — Other Ambulatory Visit: Payer: Self-pay

## 2020-10-24 DIAGNOSIS — R110 Nausea: Secondary | ICD-10-CM | POA: Insufficient documentation

## 2020-10-24 DIAGNOSIS — Z20822 Contact with and (suspected) exposure to covid-19: Secondary | ICD-10-CM | POA: Insufficient documentation

## 2020-10-24 DIAGNOSIS — R5383 Other fatigue: Secondary | ICD-10-CM | POA: Insufficient documentation

## 2020-10-24 DIAGNOSIS — H538 Other visual disturbances: Secondary | ICD-10-CM | POA: Diagnosis not present

## 2020-10-24 DIAGNOSIS — R0982 Postnasal drip: Secondary | ICD-10-CM | POA: Insufficient documentation

## 2020-10-24 DIAGNOSIS — Z79899 Other long term (current) drug therapy: Secondary | ICD-10-CM | POA: Insufficient documentation

## 2020-10-24 DIAGNOSIS — R0981 Nasal congestion: Secondary | ICD-10-CM | POA: Diagnosis not present

## 2020-10-24 DIAGNOSIS — R42 Dizziness and giddiness: Secondary | ICD-10-CM | POA: Insufficient documentation

## 2020-10-24 LAB — CBC WITH DIFFERENTIAL/PLATELET
Abs Immature Granulocytes: 0.01 10*3/uL (ref 0.00–0.07)
Basophils Absolute: 0 10*3/uL (ref 0.0–0.1)
Basophils Relative: 0 %
Eosinophils Absolute: 0 10*3/uL (ref 0.0–0.5)
Eosinophils Relative: 0 %
HCT: 40.9 % (ref 39.0–52.0)
Hemoglobin: 13.6 g/dL (ref 13.0–17.0)
Immature Granulocytes: 0 %
Lymphocytes Relative: 17 %
Lymphs Abs: 1 10*3/uL (ref 0.7–4.0)
MCH: 31.7 pg (ref 26.0–34.0)
MCHC: 33.3 g/dL (ref 30.0–36.0)
MCV: 95.3 fL (ref 80.0–100.0)
Monocytes Absolute: 0.5 10*3/uL (ref 0.1–1.0)
Monocytes Relative: 8 %
Neutro Abs: 4.3 10*3/uL (ref 1.7–7.7)
Neutrophils Relative %: 75 %
Platelets: 200 10*3/uL (ref 150–400)
RBC: 4.29 MIL/uL (ref 4.22–5.81)
RDW: 14.6 % (ref 11.5–15.5)
WBC: 5.8 10*3/uL (ref 4.0–10.5)
nRBC: 0 % (ref 0.0–0.2)

## 2020-10-24 LAB — URINALYSIS, COMPLETE (UACMP) WITH MICROSCOPIC
Bilirubin Urine: NEGATIVE
Glucose, UA: >1000 mg/dL — AB
Hgb urine dipstick: NEGATIVE
Ketones, ur: NEGATIVE mg/dL
Leukocytes,Ua: NEGATIVE
Nitrite: NEGATIVE
Protein, ur: NEGATIVE mg/dL
Specific Gravity, Urine: 1.015 (ref 1.005–1.030)
pH: 6.5 (ref 5.0–8.0)

## 2020-10-24 LAB — COMPREHENSIVE METABOLIC PANEL
ALT: 13 U/L (ref 0–44)
AST: 18 U/L (ref 15–41)
Albumin: 3.7 g/dL (ref 3.5–5.0)
Alkaline Phosphatase: 68 U/L (ref 38–126)
Anion gap: 7 (ref 5–15)
BUN: 15 mg/dL (ref 8–23)
CO2: 28 mmol/L (ref 22–32)
Calcium: 9.4 mg/dL (ref 8.9–10.3)
Chloride: 100 mmol/L (ref 98–111)
Creatinine, Ser: 1.34 mg/dL — ABNORMAL HIGH (ref 0.61–1.24)
GFR, Estimated: 57 mL/min — ABNORMAL LOW (ref >60)
Glucose, Bld: 116 mg/dL — ABNORMAL HIGH (ref 70–99)
Potassium: 4.2 mmol/L (ref 3.5–5.1)
Sodium: 135 mmol/L (ref 135–145)
Total Bilirubin: 0.6 mg/dL (ref 0.3–1.2)
Total Protein: 8 g/dL (ref 6.5–8.1)

## 2020-10-24 MED ORDER — FEXOFENADINE-PSEUDOEPHED ER 60-120 MG PO TB12: 1.0000 | ORAL_TABLET | Freq: Every day | ORAL | 0 refills | Status: AC

## 2020-10-24 MED ORDER — ONDANSETRON 4 MG PO TBDP: 4.0000 mg | ORAL_TABLET | Freq: Three times a day (TID) | ORAL | 0 refills | Status: AC | PRN

## 2020-10-24 MED ORDER — FLUTICASONE PROPIONATE 50 MCG/ACT NA SUSP: 1.0000 | Freq: Two times a day (BID) | NASAL | 0 refills | Status: DC

## 2020-10-24 NOTE — ED Triage Notes (Signed)
Pt here with C/O Fatique, woozy feeling(pressure behind eyes), 2-3 days. Pt took BP at home was normal, Denies any other SX. Took Meclizine this morning.

## 2020-10-24 NOTE — ED Provider Notes (Signed)
[facility]    CSN: [id number] Arrival date & time: 10/24/20  1308      History   Chief Complaint Chief Complaint  Patient presents with   Dizziness   Fatigue    HPI ESSA HANDSHOE Sr. is a 70 y.o. male presenting for vague symptoms of "pressure behind eyes and in head", feeling "swimmy headed," fatigue, post nasal drainage, ear pressure/fullness bilaterally, and occasional blurred vision (especially of the right eye).  Admits to increased dizzy feeling when he stands.  Also states that he feels like he is going to "topple over" when he is walking.  He has chronic intermittent tingling in his fingertips due to carpal tunnel.  Denies any recent worsening of that.  He denies fever, cough, congestion, sore throat, chest pain, palpitations, SOB, abdominal pain, n/v/d, weakness.  No sick contacts and no known COVID exposure.  Patient says he did take meclizine at home today and does not believe it helped the symptoms.  Past medical history significant for CHF with preserved ejection fraction of 55% (recently saw cardiology), prediabetes, hypertension, obstructive sleep apnea, BPH, history of prostate cancer, hyperlipidemia, and obesity.  No other complaints or concerns.  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   GERD (gastroesophageal reflux disease)    High cholesterol    History of kidney stones    left kidney several times   Hypertension    Pre-diabetes    Sleep apnea    Vitamin D deficiency     Patient Active Problem List   Diagnosis Date Noted   Patellar dislocation 04/07/2017   Primary localized osteoarthritis of left knee 02/19/2017   Hyperlipidemia 11/18/2016   Essential hypertension 11/18/2016   Bilateral lower extremity edema 11/18/2016   Absent pedal pulses 11/18/2016   Vertigo 01/30/2015    Past Surgical History:  Procedure  Laterality Date   COLONOSCOPY WITH PROPOFOL N/A 09/09/2016   Procedure: COLONOSCOPY WITH PROPOFOL;  Surgeon: Lollie Sails, MD;  Location: [facility] ENDOSCOPY;  Service: Endoscopy;  Laterality: N/A;   ESOPHAGOGASTRODUODENOSCOPY (EGD) WITH PROPOFOL N/A 01/13/2018   Procedure: ESOPHAGOGASTRODUODENOSCOPY (EGD) WITH PROPOFOL;  Surgeon: [name], MD;  Location: [facility] ENDOSCOPY;  Service: Gastroenterology;  Laterality: N/A;   Partial kidney resection  1996   Prostate seed implantation  2015   TOTAL KNEE ARTHROPLASTY Left 02/19/2017   Procedure: TOTAL KNEE ARTHROPLASTY;  Surgeon: Hessie Knows, MD;  Location: [facility] ORS;  Service: Orthopedics;  Laterality: Left;   TOTAL KNEE ARTHROPLASTY Left 04/07/2017   Procedure: MEDIAL RETINACULAR REPAIR;  Surgeon: Hessie Knows, MD;  Location: [facility] ORS;  Service: Orthopedics;  Laterality: Left;       Home Medications    Prior to Admission medications   Medication Sig Start Date End Date Taking? Authorizing Provider  acetaminophen (TYLENOL) 500 MG tablet Take 500 mg by mouth 2 (two) times daily.   Yes [provider]  amLODipine (NORVASC) 10 MG tablet Take 10 mg by mouth at bedtime.    Yes [provider]  atorvastatin (LIPITOR) 80 MG tablet Take 80 mg by mouth at bedtime.  11/12/16  Yes [provider]  docusate sodium (COLACE) 100 MG capsule Take 200 mg by mouth daily.   Yes [provider]  fexofenadine-pseudoephedrine (ALLEGRA-D) 60-120 MG 12 hr tablet Take 1 tablet by mouth daily for 10  days. 10/24/20 11/03/20 Yes Danton Clap, PA-C  fluticasone (FLONASE) 50 MCG/ACT nasal spray Place 1 spray into both nostrils in the morning and at bedtime. 10/24/20  Yes Laurene Footman B, PA-C  furosemide (LASIX) 20 MG tablet Take 20 mg by mouth daily. In  Am.   Yes [provider]  losartan-hydrochlorothiazide (HYZAAR) 100-25 MG tablet Take 1 tablet by mouth daily.   Yes [provider]  meclizine (ANTIVERT) 25 MG tablet  Take 1 tablet (25 mg total) by mouth 2 (two) times daily as needed for dizziness. 02/01/15  Yes Fritzi Mandes, MD  nitroGLYCERIN (NITROSTAT) 0.4 MG SL tablet Place 0.4 mg under the tongue every 5 (five) minutes as needed for chest pain.   Yes [provider]  omeprazole (PRILOSEC) 40 MG capsule Take 40 mg by mouth daily.    Yes [provider]  ondansetron (ZOFRAN ODT) 4 MG disintegrating tablet Take 1 tablet (4 mg total) by mouth every 8 (eight) hours as needed for up to 7 days for nausea or vomiting. 10/24/20 10/31/20 Yes Laurene Footman B, PA-C  polyethylene glycol (MIRALAX / GLYCOLAX) packet Take 17 g by mouth daily as needed for mild constipation.    Yes [provider]  tamsulosin (FLOMAX) 0.4 MG CAPS capsule Take 0.4 mg by mouth daily.   Yes [provider]  aspirin EC 81 MG tablet Take 81 mg by mouth daily.    [provider]  DULoxetine (CYMBALTA) 60 MG capsule TAKE 1 CAPSULE BY MOUTH ONCE DAILY FOR MOOD AND PAIN 03/05/18   [provider]  Tdap Durwin Reges) 5-2.5-18.5 LF-MCG/0.5 injection  07/31/14   [provider]    Family History Family History  Problem Relation Age of Onset   Colon cancer Mother    Lung cancer Father    Benign prostatic hyperplasia Father     Social History Social History   Tobacco Use   Smoking status: Never   Smokeless tobacco: Never  Vaping Use   Vaping Use: Never used  Substance Use Topics   Alcohol use: Yes    Comment: rarely   Drug use: No     Allergies   Patient has no known allergies.   Review of Systems Review of Systems  Constitutional:  Positive for fatigue. Negative for fever.  HENT:  Positive for postnasal drip. Negative for congestion, rhinorrhea, sinus pressure, sinus pain and sore throat.   Eyes:  Positive for visual disturbance. Negative for photophobia.  Respiratory:  Negative for cough and shortness of breath.   Cardiovascular:  Positive for leg swelling (chronic).  Negative for chest pain and palpitations.  Gastrointestinal:  Negative for abdominal pain, diarrhea, nausea and vomiting.  Musculoskeletal:  Positive for gait problem. Negative for myalgias.  Neurological:  Positive for dizziness and headaches. Negative for syncope, facial asymmetry, speech difficulty, weakness, light-headedness and numbness.  Hematological:  Negative for adenopathy.    Physical Exam Triage Vital Signs ED Triage Vitals  Enc Vitals Group     BP 10/24/20 1320 (!) 143/75     Pulse Rate 10/24/20 1320 65     Resp 10/24/20 1320 18     Temp 10/24/20 1320 98.7 F (37.1 C)     Temp Source 10/24/20 1320 Oral     SpO2 10/24/20 1320 97 %     Weight 10/24/20 1317 (!) 330 lb (149.7 kg)     Height 10/24/20 1317 '5\' 9"'$  (1.753 m)     Head Circumference --  Peak Flow --      Pain Score 10/24/20 1317 0     Pain Loc --      Pain Edu? --      Excl. in [place]? --    No data found.  Updated Vital Signs BP (!) 143/75 (BP Location: Left Arm)   Pulse 65   Temp 98.7 F (37.1 C) (Oral)   Resp 18   Ht '5\' 9"'$  (1.753 m)   Wt (!) 330 lb (149.7 kg)   SpO2 97%   BMI 48.73 kg/m   Physical Exam Vitals and nursing note reviewed.  Constitutional:      General: He is not in acute distress.    Appearance: Normal appearance. He is well-developed. He is obese. He is not ill-appearing.  HENT:     Head: Normocephalic and atraumatic.     Right Ear: Ear canal and external ear normal. A middle ear effusion is present. Tympanic membrane is injected.     Left Ear: Ear canal and external ear normal. A middle ear effusion is present. Tympanic membrane is injected.     Nose: Mucosal edema present.     Mouth/Throat:     Mouth: Mucous membranes are moist.     Pharynx: Oropharynx is clear. Posterior oropharyngeal erythema (mild with scant PND) present.  Eyes:     General: No scleral icterus.    Extraocular Movements: Extraocular movements intact.     Conjunctiva/sclera: Conjunctivae normal.      Pupils: Pupils are equal, round, and reactive to light.  Cardiovascular:     Rate and Rhythm: Normal rate and regular rhythm.     Heart sounds: Normal heart sounds.  Pulmonary:     Effort: Pulmonary effort is normal. No respiratory distress.     Breath sounds: Normal breath sounds. No wheezing, rhonchi or rales.  Musculoskeletal:     Cervical back: Neck supple.     Right lower leg: Edema (1+ pitting edema to mid shin) present.     Left lower leg: Edema (2+ pitting edema to mid shin) present.  Skin:    General: Skin is warm and dry.  Neurological:     General: No focal deficit present.     Mental Status: He is alert. Mental status is at baseline.     Motor: No weakness.     Gait: Gait normal.  Psychiatric:        Mood and Affect: Mood normal.        Behavior: Behavior normal.        Thought Content: Thought content normal.     UC Treatments / Results  Labs (all labs ordered are listed, but only abnormal results are displayed) Labs Reviewed  URINALYSIS, COMPLETE (UACMP) WITH MICROSCOPIC - Abnormal; Notable for the following components:      Result Value   Glucose, UA >1,000 (*)    Bacteria, UA FEW (*)    All other components within normal limits  COMPREHENSIVE METABOLIC PANEL - Abnormal; Notable for the following components:   Glucose, Bld 116 (*)    Creatinine, Ser 1.34 (*)    GFR, Estimated 57 (*)    All other components within normal limits  SARS CORONAVIRUS 2 (TAT 6-24 HRS)  CBC WITH DIFFERENTIAL/PLATELET    EKG   Radiology No results found.  Procedures Procedures (including critical care time)  Medications Ordered in UC Medications - No data to display  Initial Impression / Assessment and Plan / UC Course  I have reviewed the  triage vital signs and the nursing notes.  Pertinent labs & imaging results that were available during my care of the patient were reviewed by me and considered in my medical decision making (see chart for details).   70 y/o male  presents with 3-4 day history of "pressure behind eyes and in head", feeling "swimmy headed," fatigue, post nasal drainage, ear pressure/fullness bilaterally, and occasional blurred vision (especially of the right eye).    VSS, BP elevated mildly at 143/75.  Patient is overall well-appearing.  Exam is significant for PERRLA, mild effusion of both TMs with slight injection, mild nasal mucosal swelling, mild posterior pharyngeal erythema with clear postnasal drainage.  Chest is clear to auscultation heart regular rate and rhythm.  He does have increased lower extremity swelling but denies it being any more significant than it normally is.  States he elevates his legs only at night and does not wear compression hose.  He does have 2+ pitting edema to the mid tibia on the left side and 1+ pitting edema to the mid tibia on the right side.  UA today shows greater than 1000 glucose.  PCR COVID test obtained.  Current CDC guidelines, isolation protocol and ED precautions reviewed with patient.  CBC and CMP obtained.  Creatinine is elevated at 1.34 but creatinine was elevated 1.38 in April of this year.  No changes there.  No electrolyte abnormalities.  Glucose elevated at 116.  CBC normal.  Reviewed all results with patient.  Suspect symptoms due to sinus congestion and eustachian tube dysfunction.  I have sent in Allegra-D and fluticasone.  Also sent in Zofran for his nausea.  Patient advised to follow-up with eye specialist.  No significant vision changes and no loss of vision.  Cranial nerve exam is within normal limits and no gait abnormality from baseline.  I did thoroughly review ED precautions with patient and advised to go to ED if his head pressure worsens or he has worsening vision issues before he can see eye specialist, numbness/weakness in extremities, chest pain, breathing issue, increased swelling in lower extremities, abdominal pain, presyncope or syncope, or any severe acute worsening of  symptoms.  Low suspicion for intracranial abnormality but he did again thoroughly review precautions with him.   Final Clinical Impressions(s) / UC Diagnoses   Final diagnoses:  Sinus congestion  Post-nasal drainage  Dizziness  Fatigue, unspecified type  Blurred vision  Nausea without vomiting     Discharge Instructions      -Your labs look pretty good. No changes from previous labs -COVID test will be back tomorrow and we will call if + and sent antiviral medications -Your presentation today is consistent with sinus congestion/pressure.  I have sent Allegra-D for this and a nasal spray.  I have also sent Zofran to help with the nausea. -Make appointment with eye doctor regarding your right eye. -If you develop any worsening headaches or have loss of vision, feel faint or pass out, dizziness worsens, your chest hurts, you have breathing difficulty or increased leg swelling you need to call 911 or have someone take you to the emergency department. -Wear compression stockings help with the swelling in your legs and elevate them more frequently. -Make a follow-up appointment and with your primary care provider if you are still not feeling well in a few days.  You have received COVID testing today either for positive exposure, concerning symptoms that could be related to COVID infection, screening purposes, or re-testing after confirmed positive.  Your  test obtained today checks for active viral infection in the last 1-2 weeks. If your test is negative now, you can still test positive later. So, if you do develop symptoms you should either get re-tested and/or isolate x 5 days and then strict mask use x 5 days (unvaccinated) or mask use x 10 days (vaccinated). Please follow CDC guidelines.  While Rapid antigen tests come back in 15-20 minutes, send out PCR/molecular test results typically come back within 1-3 days. In the mean time, if you are symptomatic, assume this could be a positive  test and treat/monitor yourself as if you do have COVID.   We will call with test results if positive. Please download the MyChart app and set up a profile to access test results.   If symptomatic, go home and rest. Push fluids. Take Tylenol as needed for discomfort. Gargle warm salt water. Throat lozenges. Take Mucinex DM or Robitussin for cough. Humidifier in bedroom to ease coughing. Warm showers. Also review the COVID handout for more information.  COVID-19 INFECTION: The incubation period of COVID-19 is approximately 14 days after exposure, with most symptoms developing in roughly 4-5 days. Symptoms may range in severity from mild to critically severe. Roughly 80% of those infected will have mild symptoms. People of any age may become infected with COVID-19 and have the ability to transmit the virus. The most common symptoms include: fever, fatigue, cough, body aches, headaches, sore throat, nasal congestion, shortness of breath, nausea, vomiting, diarrhea, changes in smell and/or taste.    COURSE OF ILLNESS Some patients may begin with mild disease which can progress quickly into critical symptoms. If your symptoms are worsening please call ahead to the Emergency Department and proceed there for further treatment. Recovery time appears to be roughly 1-2 weeks for mild symptoms and 3-6 weeks for severe disease.   GO IMMEDIATELY TO ER FOR FEVER YOU ARE UNABLE TO GET DOWN WITH TYLENOL, BREATHING PROBLEMS, CHEST PAIN, FATIGUE, LETHARGY, INABILITY TO EAT OR DRINK, ETC  QUARANTINE AND ISOLATION: To help decrease the spread of COVID-19 please remain isolated if you have COVID infection or are highly suspected to have COVID infection. This means -stay home and isolate to one room in the home if you live with others. Do not share a bed or bathroom with others while ill, sanitize and wipe down all countertops and keep common areas clean and disinfected. Stay home for 5 days. If you have no symptoms or your  symptoms are resolving after 5 days, you can leave your house. Continue to wear a mask around others for 5 additional days. If you have been in close contact (within 6 feet) of someone diagnosed with COVID 19, you are advised to quarantine in your home for 14 days as symptoms can develop anywhere from 2-14 days after exposure to the virus. If you develop symptoms, you  must isolate.  Most current guidelines for COVID after exposure -unvaccinated: isolate 5 days and strict mask use x 5 days. Test on day 5 is possible -vaccinated: wear mask x 10 days if symptoms do not develop -You do not necessarily need to be tested for COVID if you have + exposure and  develop symptoms. Just isolate at home x10 days from symptom onset During this global pandemic, CDC advises to practice social distancing, try to stay at least 64f away from others at all times. Wear a face covering. Wash and sanitize your hands regularly and avoid going anywhere that is not necessary.  KEEP  IN MIND THAT THE COVID TEST IS NOT 100% ACCURATE AND YOU SHOULD STILL DO EVERYTHING TO PREVENT POTENTIAL SPREAD OF VIRUS TO OTHERS (WEAR MASK, WEAR GLOVES, [place] HANDS AND SANITIZE REGULARLY). IF INITIAL TEST IS NEGATIVE, THIS MAY NOT MEAN YOU ARE DEFINITELY NEGATIVE. MOST ACCURATE TESTING IS DONE 5-7 DAYS AFTER EXPOSURE.   It is not advised by CDC to get re-tested after receiving a positive COVID test since you can still test positive for weeks to months after you have already cleared the virus.   *If you have not been vaccinated for COVID, I strongly suggest you consider getting vaccinated as long as there are no contraindications.       ED Prescriptions     Medication Sig Dispense Auth. Provider   fluticasone (FLONASE) 50 MCG/ACT nasal spray Place 1 spray into both nostrils in the morning and at bedtime. 1 g Laurene Footman B, PA-C   fexofenadine-pseudoephedrine (ALLEGRA-D) 60-120 MG 12 hr tablet Take 1 tablet by mouth daily for 10 days. 10  tablet Laurene Footman B, PA-C   ondansetron (ZOFRAN ODT) 4 MG disintegrating tablet Take 1 tablet (4 mg total) by mouth every 8 (eight) hours as needed for up to 7 days for nausea or vomiting. 20 tablet Gretta Cool      PDMP not reviewed this encounter.   Danton Clap, PA-C 10/24/20 1527

## 2020-10-24 NOTE — Discharge Instructions (Addendum)
-Your labs look pretty good. No changes from previous labs -COVID test will be back tomorrow and we will call if + and sent antiviral medications -Your presentation today is consistent with sinus congestion/pressure.  I have sent Allegra-D for this and a nasal spray.  I have also sent Zofran to help with the nausea. -Make appointment with eye doctor regarding your right eye. -If you develop any worsening headaches or have loss of vision, feel faint or pass out, dizziness worsens, your chest hurts, you have breathing difficulty or increased leg swelling you need to call 911 or have someone take you to the emergency department. -Wear compression stockings help with the swelling in your legs and elevate them more frequently. -Make a follow-up appointment and with your primary care provider if you are still not feeling well in a few days.  You have received COVID testing today either for positive exposure, concerning symptoms that could be related to COVID infection, screening purposes, or re-testing after confirmed positive.  Your test obtained today checks for active viral infection in the last 1-2 weeks. If your test is negative now, you can still test positive later. So, if you do develop symptoms you should either get re-tested and/or isolate x 5 days and then strict mask use x 5 days (unvaccinated) or mask use x 10 days (vaccinated). Please follow CDC guidelines.  While Rapid antigen tests come back in 15-20 minutes, send out PCR/molecular test results typically come back within 1-3 days. In the mean time, if you are symptomatic, assume this could be a positive test and treat/monitor yourself as if you do have COVID.   We will call with test results if positive. Please download the MyChart app and set up a profile to access test results.   If symptomatic, go home and rest. Push fluids. Take Tylenol as needed for discomfort. Gargle warm salt water. Throat lozenges. Take Mucinex DM or Robitussin for  cough. Humidifier in bedroom to ease coughing. Warm showers. Also review the COVID handout for more information.  COVID-19 INFECTION: The incubation period of COVID-19 is approximately 14 days after exposure, with most symptoms developing in roughly 4-5 days. Symptoms may range in severity from mild to critically severe. Roughly 80% of those infected will have mild symptoms. People of any age may become infected with COVID-19 and have the ability to transmit the virus. The most common symptoms include: fever, fatigue, cough, body aches, headaches, sore throat, nasal congestion, shortness of breath, nausea, vomiting, diarrhea, changes in smell and/or taste.    COURSE OF ILLNESS Some patients may begin with mild disease which can progress quickly into critical symptoms. If your symptoms are worsening please call ahead to the Emergency Department and proceed there for further treatment. Recovery time appears to be roughly 1-2 weeks for mild symptoms and 3-6 weeks for severe disease.   GO IMMEDIATELY TO ER FOR FEVER YOU ARE UNABLE TO GET DOWN WITH TYLENOL, BREATHING PROBLEMS, CHEST PAIN, FATIGUE, LETHARGY, INABILITY TO EAT OR DRINK, ETC  QUARANTINE AND ISOLATION: To help decrease the spread of COVID-19 please remain isolated if you have COVID infection or are highly suspected to have COVID infection. This means -stay home and isolate to one room in the home if you live with others. Do not share a bed or bathroom with others while ill, sanitize and wipe down all countertops and keep common areas clean and disinfected. Stay home for 5 days. If you have no symptoms or your symptoms are resolving after  5 days, you can leave your house. Continue to wear a mask around others for 5 additional days. If you have been in close contact (within 6 feet) of someone diagnosed with COVID 19, you are advised to quarantine in your home for 14 days as symptoms can develop anywhere from 2-14 days after exposure to the virus. If  you develop symptoms, you  must isolate.  Most current guidelines for COVID after exposure -unvaccinated: isolate 5 days and strict mask use x 5 days. Test on day 5 is possible -vaccinated: wear mask x 10 days if symptoms do not develop -You do not necessarily need to be tested for COVID if you have + exposure and  develop symptoms. Just isolate at home x10 days from symptom onset During this global pandemic, CDC advises to practice social distancing, try to stay at least 67f away from others at all times. Wear a face covering. Wash and sanitize your hands regularly and avoid going anywhere that is not necessary.  KEEP IN MIND THAT THE COVID TEST IS NOT 100% ACCURATE AND YOU SHOULD STILL DO EVERYTHING TO PREVENT POTENTIAL SPREAD OF VIRUS TO OTHERS (WEAR MASK, WEAR GLOVES, [place] AND SANITIZE REGULARLY). IF INITIAL TEST IS NEGATIVE, THIS MAY NOT MEAN YOU ARE DEFINITELY NEGATIVE. MOST ACCURATE TESTING IS DONE 5-7 DAYS AFTER EXPOSURE.   It is not advised by CDC to get re-tested after receiving a positive COVID test since you can still test positive for weeks to months after you have already cleared the virus.   *If you have not been vaccinated for COVID, I strongly suggest you consider getting vaccinated as long as there are no contraindications.

## 2020-10-25 LAB — SARS CORONAVIRUS 2 (TAT 6-24 HRS): SARS Coronavirus 2: NEGATIVE

## 2021-01-15 ENCOUNTER — Ambulatory Visit: Payer: Medicare HMO | Admitting: Gastroenterology

## 2021-04-02 ENCOUNTER — Other Ambulatory Visit: Payer: Self-pay

## 2021-04-02 DIAGNOSIS — Z800 Family history of malignant neoplasm of digestive organs: Secondary | ICD-10-CM

## 2021-04-02 DIAGNOSIS — D126 Benign neoplasm of colon, unspecified: Secondary | ICD-10-CM

## 2021-04-02 MED ORDER — PEG 3350-KCL-NA BICARB-NACL 420 G PO SOLR: 4000.0000 mL | Freq: Once | ORAL | 0 refills | Status: AC

## 2021-04-02 NOTE — Progress Notes (Signed)
Gastroenterology Pre-Procedure Review  Request Date: 04/18/2021 Requesting Physician: Dr. Vicente Males  PATIENT REVIEW QUESTIONS: The patient responded to the following health history questions as indicated:    1. Are you having any GI issues? no 2. Do you have a personal history of Polyps?  Upper Endo 08/2017. 3. Do you have a family history of Colon Cancer or Polyps? yes (Mother- colon cancer) 4. Diabetes Mellitus? no 5. Joint replacements in the past 12 months?no 6. Major health problems in the past 3 months?no 7. Any artificial heart valves, MVP, or defibrillator?no    MEDICATIONS & ALLERGIES:    Patient reports the following regarding taking any anticoagulation/antiplatelet therapy:   Plavix, Coumadin, Eliquis, Xarelto, Lovenox, Pradaxa, Brilinta, or Effient? no Aspirin? no  Patient confirms/reports the following medications:  Current Outpatient Medications  Medication Sig Dispense Refill   acetaminophen (TYLENOL) 500 MG tablet Take 500 mg by mouth 2 (two) times daily.     amLODipine (NORVASC) 10 MG tablet Take 10 mg by mouth at bedtime.      aspirin EC 81 MG tablet Take 81 mg by mouth daily.     atorvastatin (LIPITOR) 80 MG tablet Take 80 mg by mouth at bedtime.      docusate sodium (COLACE) 100 MG capsule Take 200 mg by mouth daily.     DULoxetine (CYMBALTA) 60 MG capsule TAKE 1 CAPSULE BY MOUTH ONCE DAILY FOR MOOD AND PAIN     fluticasone (FLONASE) 50 MCG/ACT nasal spray Place 1 spray into both nostrils in the morning and at bedtime. 1 g 0   furosemide (LASIX) 20 MG tablet Take 20 mg by mouth daily. In  Am.     losartan-hydrochlorothiazide (HYZAAR) 100-25 MG tablet Take 1 tablet by mouth daily.     meclizine (ANTIVERT) 25 MG tablet Take 1 tablet (25 mg total) by mouth 2 (two) times daily as needed for dizziness. 30 tablet 0   nitroGLYCERIN (NITROSTAT) 0.4 MG SL tablet Place 0.4 mg under the tongue every 5 (five) minutes as needed for chest pain.     omeprazole (PRILOSEC) 40 MG  capsule Take 40 mg by mouth daily.      polyethylene glycol (MIRALAX / GLYCOLAX) packet Take 17 g by mouth daily as needed for mild constipation.      tamsulosin (FLOMAX) 0.4 MG CAPS capsule Take 0.4 mg by mouth daily.     Tdap (BOOSTRIX) 5-2.5-18.5 LF-MCG/0.5 injection      No current facility-administered medications for this visit.    Patient confirms/reports the following allergies:  No Known Allergies  No orders of the defined types were placed in this encounter.   AUTHORIZATION INFORMATION Primary Insurance: 1D#: Group #:  Secondary Insurance: 1D#: Group #:  SCHEDULE INFORMATION: Date: 04/18/2021 Time: Location: [facility]

## 2021-04-18 ENCOUNTER — Ambulatory Visit
Admission: RE | Admit: 2021-04-18 | Discharge: 2021-04-18 | Disposition: A | Discharge status: Home / Self Care | Payer: Medicare HMO | Attending: Gastroenterology | Admitting: Gastroenterology

## 2021-04-18 ENCOUNTER — Other Ambulatory Visit: Payer: Self-pay

## 2021-04-18 ENCOUNTER — Ambulatory Visit: Payer: Medicare HMO | Admitting: Anesthesiology

## 2021-04-18 ENCOUNTER — Encounter: Payer: Self-pay | Admitting: Gastroenterology

## 2021-04-18 ENCOUNTER — Encounter
Admission: RE | Disposition: A | Discharge status: Home / Self Care | Payer: Self-pay | Source: Home / Self Care | Attending: Gastroenterology

## 2021-04-18 DIAGNOSIS — D122 Benign neoplasm of ascending colon: Secondary | ICD-10-CM | POA: Insufficient documentation

## 2021-04-18 DIAGNOSIS — M1990 Unspecified osteoarthritis, unspecified site: Secondary | ICD-10-CM | POA: Diagnosis not present

## 2021-04-18 DIAGNOSIS — K219 Gastro-esophageal reflux disease without esophagitis: Secondary | ICD-10-CM | POA: Diagnosis not present

## 2021-04-18 DIAGNOSIS — D126 Benign neoplasm of colon, unspecified: Secondary | ICD-10-CM

## 2021-04-18 DIAGNOSIS — D124 Benign neoplasm of descending colon: Secondary | ICD-10-CM | POA: Insufficient documentation

## 2021-04-18 DIAGNOSIS — Z8546 Personal history of malignant neoplasm of prostate: Secondary | ICD-10-CM | POA: Insufficient documentation

## 2021-04-18 DIAGNOSIS — D123 Benign neoplasm of transverse colon: Secondary | ICD-10-CM | POA: Insufficient documentation

## 2021-04-18 DIAGNOSIS — Z800 Family history of malignant neoplasm of digestive organs: Secondary | ICD-10-CM

## 2021-04-18 DIAGNOSIS — G4730 Sleep apnea, unspecified: Secondary | ICD-10-CM | POA: Insufficient documentation

## 2021-04-18 DIAGNOSIS — K635 Polyp of colon: Secondary | ICD-10-CM | POA: Diagnosis not present

## 2021-04-18 DIAGNOSIS — N400 Enlarged prostate without lower urinary tract symptoms: Secondary | ICD-10-CM | POA: Insufficient documentation

## 2021-04-18 DIAGNOSIS — Z1211 Encounter for screening for malignant neoplasm of colon: Secondary | ICD-10-CM | POA: Diagnosis present

## 2021-04-18 DIAGNOSIS — I10 Essential (primary) hypertension: Secondary | ICD-10-CM | POA: Insufficient documentation

## 2021-04-18 DIAGNOSIS — Z6841 Body Mass Index (BMI) 40.0 and over, adult: Secondary | ICD-10-CM | POA: Diagnosis not present

## 2021-04-18 DIAGNOSIS — E7800 Pure hypercholesterolemia, unspecified: Secondary | ICD-10-CM | POA: Diagnosis not present

## 2021-04-18 HISTORY — PX: COLONOSCOPY WITH PROPOFOL: SHX5780

## 2021-04-18 HISTORY — DX: Obesity, unspecified: E66.9

## 2021-04-18 SURGERY — COLONOSCOPY WITH PROPOFOL
Anesthesia: General

## 2021-04-18 MED ORDER — GLYCOPYRROLATE 0.2 MG/ML IJ SOLN
INTRAMUSCULAR | Status: DC | PRN
Start: 2021-04-18 — End: 2021-04-18
  Administered 2021-04-18: .2 mg via INTRAVENOUS

## 2021-04-18 MED ORDER — PROPOFOL 500 MG/50ML IV EMUL
INTRAVENOUS | Status: DC | PRN
  Administered 2021-04-18: 140 ug/kg/min via INTRAVENOUS

## 2021-04-18 MED ORDER — LIDOCAINE HCL (CARDIAC) PF 100 MG/5ML IV SOSY
PREFILLED_SYRINGE | INTRAVENOUS | Status: DC | PRN
  Administered 2021-04-18: 100 mg via INTRAVENOUS

## 2021-04-18 MED ORDER — PROPOFOL 10 MG/ML IV BOLUS
INTRAVENOUS | Status: DC | PRN
  Administered 2021-04-18: 70 mg via INTRAVENOUS

## 2021-04-18 MED ORDER — EPHEDRINE SULFATE (PRESSORS) 50 MG/ML IJ SOLN
INTRAMUSCULAR | Status: DC | PRN
  Administered 2021-04-18 (×2): 10 mg via INTRAVENOUS
  Administered 2021-04-18: 5 mg via INTRAVENOUS

## 2021-04-18 MED ORDER — SODIUM CHLORIDE 0.9 % IV SOLN: INTRAVENOUS | Status: DC

## 2021-04-18 NOTE — Anesthesia Preprocedure Evaluation (Signed)
Anesthesia Evaluation  ?Patient identified by MRN, date of birth, ID band ?Patient awake ? ? ? ?Reviewed: ?Allergy & Precautions, NPO status , Patient's Chart, lab work & pertinent test results ? ?Airway ?Mallampati: II ? ?TM Distance: >3 FB ?Neck ROM: Full ? ? ? Dental ? ?(+) Upper Dentures, Lower Dentures ?  ?Pulmonary ?neg pulmonary ROS, sleep apnea and Continuous Positive Airway Pressure Ventilation ,  ?  ?Pulmonary exam normal ? ?+ decreased breath sounds ? ? ? ? ? Cardiovascular ?Exercise Tolerance: Good ?hypertension, Pt. on medications ?negative cardio ROS ?Normal cardiovascular exam ?Rhythm:Regular Rate:Normal ? ? ?  ?Neuro/Psych ?negative neurological ROS ? negative psych ROS  ? GI/Hepatic ?negative GI ROS, Neg liver ROS,   ?Endo/Other  ?negative endocrine ROSMorbid obesity ? Renal/GU ?negative Renal ROS  ?negative genitourinary ?  ?Musculoskeletal ? ?(+) Arthritis ,  ? Abdominal ?(+) + obese,   ?Peds ?negative pediatric ROS ?(+)  Hematology ?negative hematology ROS ?(+)   ?Anesthesia Other Findings ?Past Medical History: ?No date: Arthritis ?No date: BPH (benign prostatic hyperplasia) ?2015: Cancer ([place]) ?    Comment:  Prostate CA ?1996: Cancer ([place]) ?    Comment:  left Kidney ?No date: GERD (gastroesophageal reflux disease) ?No date: High cholesterol ?No date: History of kidney stones ?    Comment:  left kidney several times ?No date: Hypertension ?No date: Obese ?No date: Pre-diabetes ?No date: Sleep apnea ?No date: Vitamin D deficiency ? ?Past Surgical History: ?No date: COLONOSCOPY ?No date: COLONOSCOPY ?09/09/2016: COLONOSCOPY WITH PROPOFOL; N/A ?    Comment:  Procedure: COLONOSCOPY WITH PROPOFOL;  Surgeon:  ?             Lollie Sails, MD;  Location: [facility] ENDOSCOPY;   ?             Service: Endoscopy;  Laterality: N/A; ?01/13/2018: ESOPHAGOGASTRODUODENOSCOPY (EGD) WITH PROPOFOL; N/A ?    Comment:  Procedure: ESOPHAGOGASTRODUODENOSCOPY (EGD) WITH  ?              PROPOFOL;  Surgeon: [name], MD;  Location: [place]  ?             ENDOSCOPY;  Service: Gastroenterology;  Laterality: N/A; ?1996: Partial kidney resection ?2015: Prostate seed implantation ?02/19/2017: TOTAL KNEE ARTHROPLASTY; Left ?    Comment:  Procedure: TOTAL KNEE ARTHROPLASTY;  Surgeon: Rudene Christians,  ?             Legrand Como, MD;  Location: [facility] ORS;  Service: Orthopedics;  ?             Laterality: Left; ?04/07/2017: TOTAL KNEE ARTHROPLASTY; Left ?    Comment:  Procedure: MEDIAL RETINACULAR REPAIR;  Surgeon: Rudene Christians,  ?             Legrand Como, MD;  Location: [facility] ORS;  Service: Orthopedics;  ?             Laterality: Left; ? ? ? ? Reproductive/Obstetrics ?negative OB ROS ? ?  ? ? ? ? ? ? ? ? ? ? ? ? ? ?  ?  ? ? ? ? ? ? ? ? ?Anesthesia Physical ?Anesthesia Plan ? ?ASA: 3 ? ?Anesthesia Plan: General  ? ?Post-op Pain Management:   ? ?Induction: Intravenous ? ?PONV Risk Score and Plan: Propofol infusion and TIVA ? ?Airway Management Planned: Natural Airway and Nasal Cannula ? ?Additional Equipment:  ? ?Intra-op Plan:  ? ?Post-operative Plan:  ? ?Informed Consent: I have reviewed the patients History and Physical, chart, labs  and discussed the procedure including the risks, benefits and alternatives for the proposed anesthesia with the patient or authorized representative who has indicated his/her understanding and acceptance.  ? ? ? ?Dental Advisory Given ? ?Plan Discussed with: CRNA and Surgeon ? ?Anesthesia Plan Comments:   ? ? ? ? ? ? ?Anesthesia Quick Evaluation ? ?

## 2021-04-18 NOTE — Op Note (Addendum)
[facility] ?Gastroenterology ?Patient Name: Colin Mccoy ?Procedure Date: 04/18/2021 9:41 AM ?MRN: [id number] ?Account #: [id number] ?Date of Birth: [birth year] ?Admit Type: Outpatient ?Age: 71 ?Room: [facility] ENDO ROOM 4 ?Gender: Male ?Note Status: Finalized ?Instrument Name: Colonoscope [id number] ?Procedure:             Colonoscopy ?Indications:           Screening for colorectal malignant neoplasm ?Providers:             [name] MD, MD ?Referring MD:          Neldon Labella. Ashkin MD (Referring MD) ?Medicines:             Monitored Anesthesia Care ?Complications:         No immediate complications. ?Procedure:             Pre-Anesthesia Assessment: ?                       - Prior to the procedure, a History and Physical was  ?                       performed, and patient medications, allergies and  ?                       sensitivities were reviewed. The patient's tolerance  ?                       of previous anesthesia was reviewed. ?                       - The risks and benefits of the procedure and the  ?                       sedation options and risks were discussed with the  ?                       patient. All questions were answered and informed  ?                       consent was obtained. ?                       - ASA Grade Assessment: II - A patient with mild  ?                       systemic disease. ?                       After obtaining informed consent, the colonoscope was  ?                       passed under direct vision. Throughout the procedure,  ?                       the patient's blood pressure, pulse, and oxygen  ?                       saturations were monitored continuously. The  ?                       Colonoscope was  introduced through the anus and  ?                       advanced to the the cecum, identified by the  ?                       appendiceal orifice. The colonoscopy was performed  ?                       with ease. The patient tolerated the procedure well.  ?                        The quality of the bowel preparation was good. ?Findings: ?     The perianal and digital rectal examinations were normal. ?     Three sessile polyps were found in the ascending colon. The polyps were  ?     5 to 7 mm in size. These polyps were removed with a cold snare.  ?     Resection and retrieval were complete. To prevent bleeding after the  ?     polypectomy, one hemostatic clip was successfully placed. There was no  ?     bleeding during, or at the end, of the procedure. ?     Three sessile polyps were found in the transverse colon. The polyps were  ?     5 to 7 mm in size. These polyps were removed with a cold snare.  ?     Resection and retrieval were complete. ?     Two sessile polyps were found in the descending colon. The polyps were 4  ?     to 6 mm in size. These polyps were removed with a cold snare. Resection  ?     and retrieval were complete. ?     The exam was otherwise without abnormality on direct and retroflexion  ?     views. ?Impression:            - Three 5 to 7 mm polyps in the ascending colon,  ?                       removed with a cold snare. Resected and retrieved.  ?                       Clip was placed. ?                       - Three 5 to 7 mm polyps in the transverse colon,  ?                       removed with a cold snare. Resected and retrieved. ?                       - Two 4 to 6 mm polyps in the descending colon,  ?                       removed with a cold snare. Resected and retrieved. ?                       - The examination was otherwise normal on direct and  ?  retroflexion views. ?Recommendation:        - Discharge patient to home (with escort). ?                       - Resume previous diet. ?                       - Continue present medications. ?                       - Await pathology results. ?                       - Repeat colonoscopy in 3 years for surveillance based  ?                       on pathology results. ?Procedure  Code(s):     --- Professional --- ?                       [phone], Colonoscopy, flexible; with removal of  ?                       tumor(s), polyp(s), or other lesion(s) by snare  ?                       technique ?Diagnosis Code(s):     --- Professional --- ?                       Z12.11, Encounter for screening for malignant neoplasm  ?                       of colon ?                       K63.5, Polyp of colon ?CPT copyright 2019 American Medical Association. All rights reserved. ?The codes documented in this report are preliminary and upon coder review may  ?be revised to meet current compliance requirements. ?[name], MD ?[name] MD, MD ?04/18/2021 10:22:09 AM ?This report has been signed electronically. ?Number of Addenda: 0 ?Note Initiated On: 04/18/2021 9:41 AM ?Scope Withdrawal Time: 0 hours 15 minutes 17 seconds  ?Total Procedure Duration: 0 hours 20 minutes 8 seconds  ?Estimated Blood Loss:  Estimated blood loss: none. ?     [facility] ?

## 2021-04-18 NOTE — H&P (Signed)
?Colin Bellows, MD ?[address] [place], [place], [place], [address] ?[address] [place], [place], [place], [address] ?Phone: [phone]  ?Fax: [phone] ? ?Primary Care Physician:  Ardine Eng, MD ? ? ?Pre-Procedure History & Physical: ?HPI:  Colin KAUK Sr. is a 71 y.o. male is here for an colonoscopy. ?  ?Past Medical History:  ?Diagnosis Date  ? Arthritis   ? BPH (benign prostatic hyperplasia)   ? Cancer [facility]) 2015  ? Prostate CA  ? Cancer [facility]) 1996  ? left Kidney  ? GERD (gastroesophageal reflux disease)   ? High cholesterol   ? History of kidney stones   ? left kidney several times  ? Hypertension   ? Obese   ? Pre-diabetes   ? Sleep apnea   ? Vitamin D deficiency   ? ? ?Past Surgical History:  ?Procedure Laterality Date  ? COLONOSCOPY    ? COLONOSCOPY    ? COLONOSCOPY WITH PROPOFOL N/A 09/09/2016  ? Procedure: COLONOSCOPY WITH PROPOFOL;  Surgeon: Lollie Sails, MD;  Location: [facility] ENDOSCOPY;  Service: Endoscopy;  Laterality: N/A;  ? ESOPHAGOGASTRODUODENOSCOPY (EGD) WITH PROPOFOL N/A 01/13/2018  ? Procedure: ESOPHAGOGASTRODUODENOSCOPY (EGD) WITH PROPOFOL;  Surgeon: Colin Bellows, MD;  Location: [facility] ENDOSCOPY;  Service: Gastroenterology;  Laterality: N/A;  ? Partial kidney resection  1996  ? Prostate seed implantation  2015  ? TOTAL KNEE ARTHROPLASTY Left 02/19/2017  ? Procedure: TOTAL KNEE ARTHROPLASTY;  Surgeon: Hessie Knows, MD;  Location: [facility] ORS;  Service: Orthopedics;  Laterality: Left;  ? TOTAL KNEE ARTHROPLASTY Left 04/07/2017  ? Procedure: MEDIAL RETINACULAR REPAIR;  Surgeon: Hessie Knows, MD;  Location: [facility] ORS;  Service: Orthopedics;  Laterality: Left;  ? ? ?Prior to Admission medications   ?Medication Sig Start Date End Date Taking? Authorizing Provider  ?amLODipine (NORVASC) 10 MG tablet Take 10 mg by mouth at bedtime.    Yes [provider]  ?aspirin EC 81 MG tablet Take 81 mg by mouth daily.   Yes [provider]  ?atorvastatin (LIPITOR) 80 MG tablet Take  80 mg by mouth at bedtime.  11/12/16  Yes [provider]  ?DULoxetine (CYMBALTA) 60 MG capsule TAKE 1 CAPSULE BY MOUTH ONCE DAILY FOR MOOD AND PAIN 03/05/18  Yes [provider]  ?furosemide (LASIX) 20 MG tablet Take 20 mg by mouth daily. In  Am.   Yes [provider]  ?losartan-hydrochlorothiazide (HYZAAR) 100-25 MG tablet Take 1 tablet by mouth daily.   Yes [provider]  ?meclizine (ANTIVERT) 25 MG tablet Take 1 tablet (25 mg total) by mouth 2 (two) times daily as needed for dizziness. 02/01/15  Yes Fritzi Mandes, MD  ?omeprazole (PRILOSEC) 40 MG capsule Take 40 mg by mouth daily.    Yes [provider]  ?polyethylene glycol (MIRALAX / GLYCOLAX) packet Take 17 g by mouth daily as needed for mild constipation.    Yes [provider]  ?tamsulosin (FLOMAX) 0.4 MG CAPS capsule Take 0.4 mg by mouth daily.   Yes [provider]  ?acetaminophen (TYLENOL) 500 MG tablet Take 500 mg by mouth 2 (two) times daily.    [provider]  ?docusate sodium (COLACE) 100 MG capsule Take 200 mg by mouth daily.    [provider]  ?fluticasone (FLONASE) 50 MCG/ACT nasal spray Place 1 spray into both nostrils in the morning and at bedtime. 10/24/20   Danton Clap, PA-C  ?nitroGLYCERIN (NITROSTAT) 0.4 MG SL tablet Place 0.4 mg under the tongue every  5 (five) minutes as needed for chest pain.    [provider]  ?Tdap Durwin Reges) 5-2.5-18.5 LF-MCG/0.5 injection  07/31/14   [provider]  ? ? ?Allergies as of 04/02/2021  ? (No Known Allergies)  ? ? ?Family History  ?Problem Relation Age of Onset  ? Colon cancer Mother   ? Lung cancer Father   ? Benign prostatic hyperplasia Father   ? ? ?Social History  ? ?Socioeconomic History  ? Marital status: Married  ?  Spouse name: Not on file  ? Number of children: Not on file  ? Years of education: Not on file  ? Highest education level: Not on file  ?Occupational History  ? Not on file  ?Tobacco Use   ? Smoking status: Never  ? Smokeless tobacco: Never  ?Vaping Use  ? Vaping Use: Never used  ?Substance and Sexual Activity  ? Alcohol use: Yes  ?  Comment: rarely  ? Drug use: No  ? Sexual activity: Yes  ?  Birth control/protection: None  ?Other Topics Concern  ? Not on file  ?Social History Narrative  ? Not on file  ? ?Social Determinants of Health  ? ?Financial Resource Strain: Not on file  ?Food Insecurity: Not on file  ?Transportation Needs: Not on file  ?Physical Activity: Not on file  ?Stress: Not on file  ?Social Connections: Not on file  ?Intimate Partner Violence: Not on file  ? ? ?Review of Systems: ?See HPI, otherwise negative ROS ? ?Physical Exam: ?BP 135/67   Pulse 96   Temp (!) 97 ?F (36.1 ?C) (Temporal)   Resp (!) 22   Ht 5\' 9"  (1.753 m)   Wt (!) 156.5 kg   SpO2 100%   BMI 50.95 kg/m?  ?General:   Alert,  pleasant and cooperative in NAD ?Head:  Normocephalic and atraumatic. ?Neck:  Supple; no masses or thyromegaly. ?Lungs:  Clear throughout to auscultation, normal respiratory effort.    ?Heart:  +S1, +S2, Regular rate and rhythm, No edema. ?Abdomen:  Soft, nontender and nondistended. Normal bowel sounds, without guarding, and without rebound.   ?Neurologic:  Alert and  oriented x4;  grossly normal neurologically. ? ?Impression/Plan: ?Colin Rote Sr. is here for an colonoscopy to be performed for surveillance due to prior history of colon polyps  ? ?Risks, benefits, limitations, and alternatives regarding  colonoscopy have been reviewed with the patient.  Questions have been answered.  All parties agreeable. ? ? ?Colin Bellows, MD  04/18/2021, 9:43 AM ? ?

## 2021-04-18 NOTE — Transfer of Care (Signed)
Immediate Anesthesia Transfer of Care Note ? ?Patient: Colin DORSEY Sr. ? ?Procedure(s) Performed: COLONOSCOPY WITH PROPOFOL ? ?Patient Location: PACU ? ?Anesthesia Type:General ? ?Level of Consciousness: sedated ? ?Airway & Oxygen Therapy: Patient Spontanous Breathing ? ?Post-op Assessment: Report given to RN and Post -op Vital signs reviewed and stable ? ?Post vital signs: Reviewed and stable ? ?Last Vitals:  ?Vitals Value Taken Time  ?BP    ?Temp    ?Pulse 71 04/18/21 1023  ?Resp 15 04/18/21 1023  ?SpO2 100 % 04/18/21 1023  ?Vitals shown include unvalidated device data. ? ?Last Pain:  ?Vitals:  ? 04/18/21 0938  ?TempSrc: Temporal  ?PainSc: 0-No pain  ?   ? ?  ? ?Complications: No notable events documented. ?

## 2021-04-18 NOTE — Anesthesia Postprocedure Evaluation (Signed)
Anesthesia Post Note ? ?Patient: CASS VANDERMEULEN Sr. ? ?Procedure(s) Performed: COLONOSCOPY WITH PROPOFOL ? ?Anesthesia Type: General ?Level of consciousness: awake and alert ?Pain management: pain level controlled ?Vital Signs Assessment: post-procedure vital signs reviewed and stable ?Respiratory status: spontaneous breathing and respiratory function stable ?Cardiovascular status: stable ?Anesthetic complications: no ? ? ?No notable events documented. ? ? ?Last Vitals:  ?Vitals:  ? 04/18/21 1033 04/18/21 1043  ?BP: (!) 111/52 128/71  ?Pulse: (!) 57 65  ?Resp: 20 16  ?Temp: (!) 35.5 ?C   ?SpO2: 100% 100%  ?  ?Last Pain:  ?Vitals:  ? 04/18/21 1043  ?TempSrc:   ?PainSc: 0-No pain  ? ? ?  ?  ?  ?  ?  ?  ? ?VAN STAVEREN,Amogh Komatsu ? ? ? ? ?

## 2021-04-19 ENCOUNTER — Encounter: Payer: Self-pay | Admitting: Gastroenterology

## 2021-04-19 LAB — SURGICAL PATHOLOGY

## 2021-04-22 ENCOUNTER — Encounter: Payer: Self-pay | Admitting: Gastroenterology

## 2021-07-30 ENCOUNTER — Encounter: Payer: Self-pay | Admitting: Emergency Medicine

## 2021-07-30 ENCOUNTER — Ambulatory Visit
Admission: EM | Admit: 2021-07-30 | Discharge: 2021-07-30 | Disposition: A | Discharge status: Home / Self Care | Payer: Medicare HMO | Attending: Internal Medicine | Admitting: Internal Medicine

## 2021-07-30 DIAGNOSIS — M79602 Pain in left arm: Secondary | ICD-10-CM | POA: Diagnosis not present

## 2021-07-30 DIAGNOSIS — Z8546 Personal history of malignant neoplasm of prostate: Secondary | ICD-10-CM | POA: Insufficient documentation

## 2021-07-30 DIAGNOSIS — R0981 Nasal congestion: Secondary | ICD-10-CM | POA: Diagnosis not present

## 2021-07-30 DIAGNOSIS — N400 Enlarged prostate without lower urinary tract symptoms: Secondary | ICD-10-CM | POA: Insufficient documentation

## 2021-07-30 DIAGNOSIS — R001 Bradycardia, unspecified: Secondary | ICD-10-CM | POA: Diagnosis not present

## 2021-07-30 DIAGNOSIS — I5030 Unspecified diastolic (congestive) heart failure: Secondary | ICD-10-CM | POA: Insufficient documentation

## 2021-07-30 DIAGNOSIS — E119 Type 2 diabetes mellitus without complications: Secondary | ICD-10-CM | POA: Insufficient documentation

## 2021-07-30 NOTE — ED Triage Notes (Signed)
Pt reports left arm pain and neck pain x 1 week. States neck pan feels like crook in the back of neck. Also reports feeling "sick" behind his eyes. States he was dx with glaucoma. States he has been feeling off balance for months however denies dizziness.  Reports lower abdominal pain x 1 month. States feel like he a lot of gas. Last BM this morning.

## 2021-07-30 NOTE — Discharge Instructions (Signed)
No danger signs on exam today but history worrisome with recent (past few weeks) development of left arm discomfort and slower heart rate (40-43 bpm) than seen previously.  Please followup with your cardiologist's office in the next several days to determine what additional evaluation or treatment would be helpful.  The ECG today at urgent care did not appear to be significantly different, other than the heart rate, from previous ECGs.  For nasal congestion please use the nasal steroid spray prescribed previously every day for a while (like several weeks) until phlegm and congestion improve and subside.

## 2021-07-30 NOTE — ED Provider Notes (Addendum)
[facility]    CSN: [id number] Arrival date & time: 07/30/21  1025      History   Chief Complaint Chief Complaint  Patient presents with   Arm Pain   Neck Pain   Abdominal Pain    HPI Colin AMBS Sr. is a 71 y.o. male.  He presents to the urgent care today with a longstanding history of not feeling that well.    In the last 2 to 3 weeks, he has developed some discomfort in his left proximal arm and neck; he sleeps on his left side, and carries a cane in his left hand, but neither of these are new in the last few weeks (and the arm/neck pain is new).    He has had an increase in "stomach churning" for a couple of months, he describes this as excess stomach acid and gassiness.  He was seen in the emergency room in February 2023, and prescribed Carafate, which has not helped that much.    No change in chronic leg swelling.  Not really lightheaded.  No change in longstanding fatigue.  Not having chest pain or breathlessness per se.  Also reports sinus congestion with phlegm production, has been prescribed a nasal steroid but does not use it consistently.  No fever.  Not much cough.   Arm Pain Associated symptoms include abdominal pain.  Neck Pain Abdominal Pain   Past Medical History:  Diagnosis Date   Arthritis    BPH (benign prostatic hyperplasia)    Cancer ([place]) 2015   Prostate CA   Cancer ([place]) 1996   left Kidney   GERD (gastroesophageal reflux disease)    High cholesterol    History of kidney stones    left kidney several times   Hypertension    Obese    Pre-diabetes    Sleep apnea    Vitamin D deficiency     Patient Active Problem List   Diagnosis Date Noted   Patellar dislocation 04/07/2017   Primary localized osteoarthritis of left knee 02/19/2017   Hyperlipidemia 11/18/2016   Essential hypertension 11/18/2016   Bilateral lower extremity edema 11/18/2016   Absent pedal pulses 11/18/2016   Vertigo 01/30/2015    Past Surgical History:   Procedure Laterality Date   COLONOSCOPY     COLONOSCOPY     COLONOSCOPY WITH PROPOFOL N/A 09/09/2016   Procedure: COLONOSCOPY WITH PROPOFOL;  Surgeon: Lollie Sails, MD;  Location: [facility] ENDOSCOPY;  Service: Endoscopy;  Laterality: N/A;   COLONOSCOPY WITH PROPOFOL N/A 04/18/2021   Procedure: COLONOSCOPY WITH PROPOFOL;  Surgeon: [name], MD;  Location: [facility] ENDOSCOPY;  Service: Gastroenterology;  Laterality: N/A;   ESOPHAGOGASTRODUODENOSCOPY (EGD) WITH PROPOFOL N/A 01/13/2018   Procedure: ESOPHAGOGASTRODUODENOSCOPY (EGD) WITH PROPOFOL;  Surgeon: [name], MD;  Location: [facility] ENDOSCOPY;  Service: Gastroenterology;  Laterality: N/A;   Partial kidney resection  1996   Prostate seed implantation  2015   TOTAL KNEE ARTHROPLASTY Left 02/19/2017   Procedure: TOTAL KNEE ARTHROPLASTY;  Surgeon: Hessie Knows, MD;  Location: [facility] ORS;  Service: Orthopedics;  Laterality: Left;   TOTAL KNEE ARTHROPLASTY Left 04/07/2017   Procedure: MEDIAL RETINACULAR REPAIR;  Surgeon: Hessie Knows, MD;  Location: [facility] ORS;  Service: Orthopedics;  Laterality: Left;       Home Medications    Prior to Admission medications   Medication Sig Start Date End Date Taking? Authorizing Provider  acetaminophen (TYLENOL) 500 MG tablet Take 500 mg by mouth 2 (two) times daily.    [provider]  amLODipine (NORVASC) 10 MG tablet Take 10 mg by mouth at bedtime.     [provider]  aspirin EC 81 MG tablet Take 81 mg by mouth daily.    [provider]  atorvastatin (LIPITOR) 80 MG tablet Take 80 mg by mouth at bedtime.  11/12/16   [provider]  docusate sodium (COLACE) 100 MG capsule Take 200 mg by mouth daily.    [provider]  DULoxetine (CYMBALTA) 60 MG capsule TAKE 1 CAPSULE BY MOUTH ONCE DAILY FOR MOOD AND PAIN 03/05/18   [provider]  fluticasone (FLONASE) 50 MCG/ACT nasal spray Place 1 spray into both nostrils in the morning and at bedtime. 10/24/20    Danton Clap, PA-C  furosemide (LASIX) 20 MG tablet Take 20 mg by mouth daily. In  Am.    [provider]  losartan-hydrochlorothiazide (HYZAAR) 100-25 MG tablet Take 1 tablet by mouth daily.    [provider]  meclizine (ANTIVERT) 25 MG tablet Take 1 tablet (25 mg total) by mouth 2 (two) times daily as needed for dizziness. 02/01/15   Fritzi Mandes, MD  nitroGLYCERIN (NITROSTAT) 0.4 MG SL tablet Place 0.4 mg under the tongue every 5 (five) minutes as needed for chest pain.    [provider]  omeprazole (PRILOSEC) 40 MG capsule Take 40 mg by mouth daily.     [provider]  polyethylene glycol (MIRALAX / GLYCOLAX) packet Take 17 g by mouth daily as needed for mild constipation.     [provider]  tamsulosin (FLOMAX) 0.4 MG CAPS capsule Take 0.4 mg by mouth daily.    [provider]  Tdap Durwin Reges) 5-2.5-18.5 LF-MCG/0.5 injection  07/31/14   [provider]    Family History Family History  Problem Relation Age of Onset   Colon cancer Mother    Lung cancer Father    Benign prostatic hyperplasia Father     Social History Social History   Tobacco Use   Smoking status: Never   Smokeless tobacco: Never  Vaping Use   Vaping Use: Never used  Substance Use Topics   Alcohol use: Yes    Comment: rarely   Drug use: No     Allergies   Patient has no known allergies.   Review of Systems Review of Systems  Gastrointestinal:  Positive for abdominal pain.  Musculoskeletal:  Positive for neck pain.   See also HPI   Physical Exam Triage Vital Signs ED Triage Vitals  Enc Vitals Group     BP 07/30/21 1051 (!) 113/55     Pulse Rate 07/30/21 1051 (!) 43     Resp 07/30/21 1051 18     Temp 07/30/21 1051 98.5 F (36.9 C)     Temp Source 07/30/21 1051 Oral     SpO2 07/30/21 1051 99 %     Weight --      Height --      Pain Score 07/30/21 1052 8     Pain Loc --    Orthostatic VS for the past 24 hrs:  BP- Lying  Pulse- Lying BP- Sitting Pulse- Sitting BP- Standing at 0 minutes Pulse- Standing at 0 minutes  07/30/21 1107 100/54 (!) 41 105/52 (!) 40 123/79 53    Updated Vital Signs BP (!) 113/55 (BP Location: Left Arm)   Pulse (!) 43   Temp 98.5 F (36.9 C) (Oral)   Resp 18   SpO2 99%   Physical Exam Constitutional:  General: He is not in acute distress.    Appearance: He is not ill-appearing.     Comments: Good hygiene  HENT:     Head: Atraumatic.     Comments: B TMs opaque no erythema Mod nasal congestion, mild pallor, bilaterally Post pharynx pink    Mouth/Throat:     Mouth: Mucous membranes are moist.  Eyes:     Conjunctiva/sclera:     Right eye: Right conjunctiva is not injected. No exudate.    Left eye: Left conjunctiva is not injected. No exudate.    Comments: Conjugate gaze observed  Cardiovascular:     Rate and Rhythm: Regular rhythm. Bradycardia present.  Pulmonary:     Effort: Pulmonary effort is normal. No respiratory distress.     Breath sounds: No rhonchi or rales.     Comments: Rare soft insp wheeze Abdominal:     Comments: Abdomen protuberant  Musculoskeletal:     Cervical back: Neck supple.     Right lower leg: Edema present.     Left lower leg: Edema present.     Comments: Walked into UC with a cane (usually uses) able to climb on/off exam table and sit up without assistance Chronic stable pitting edema BLEs 3+  Skin:    General: Skin is warm and dry.     Comments: No cyanosis Not diaphoretic  Neurological:     Mental Status: He is alert.     Comments: Face symmetric speech clear/coherent/logical     UC Treatments / Results  Labs (all labs ordered are listed, but only abnormal results are displayed) Labs Reviewed - No data to display NA  EKG 1st degree AV block No acute ST or T wave changes Q waves inf leads +/- in V1 appear to have been present 06/13/20 but were possibly obscured from computer read by artifact Overall my assessment is that  today's ECG is onsistent with previous EKG tracing of 06/13/2020 and with the report of EKG done February 2023 at a different health system's emergency room.  Radiology No results found. NA  Procedures Procedures (including critical care time) NA  Medications Ordered in UC Medications - No data to display NA  Final Clinical Impressions(s) / UC Diagnoses   Final diagnoses:  Bradycardia with 41-50 beats per minute  Left arm pain  Sinus congestion     Discharge Instructions      No danger signs on exam today but history worrisome with recent (past few weeks) development of left arm discomfort and slower heart rate (40-43 bpm) than seen previously.  Please followup with your cardiologist's office in the next several days to determine what additional evaluation or treatment would be helpful.  The ECG today at urgent care did not appear to be significantly different, other than the heart rate, from previous ECGs.  For nasal congestion please use the nasal steroid spray prescribed previously every day for a while (like several weeks) until phlegm and congestion improve and subside.     ED Prescriptions   None    PDMP not reviewed this encounter.       Wynona Luna, MD 07/30/21 1459

## 2021-10-07 ENCOUNTER — Other Ambulatory Visit: Payer: Self-pay

## 2021-10-07 ENCOUNTER — Encounter: Payer: Self-pay | Admitting: Gastroenterology

## 2021-10-07 ENCOUNTER — Ambulatory Visit: Payer: Medicare HMO | Admitting: Gastroenterology

## 2021-10-07 VITALS — BP 148/83 | HR 79 | Temp 98.0°F | Ht 69.0 in | Wt 337.2 lb

## 2021-10-07 DIAGNOSIS — R0789 Other chest pain: Secondary | ICD-10-CM

## 2021-10-07 DIAGNOSIS — R42 Dizziness and giddiness: Secondary | ICD-10-CM | POA: Diagnosis not present

## 2021-10-07 DIAGNOSIS — K5909 Other constipation: Secondary | ICD-10-CM | POA: Diagnosis not present

## 2021-10-07 MED ORDER — ESOMEPRAZOLE MAGNESIUM 40 MG PO CPDR: 40.0000 mg | DELAYED_RELEASE_CAPSULE | Freq: Two times a day (BID) | ORAL | 1 refills | Status: DC

## 2021-10-07 NOTE — Patient Instructions (Signed)
Gave Linzess 52mg samples. Let [place] how they work

## 2021-10-07 NOTE — Progress Notes (Signed)
[name] MD, MRCP(U.K) [address]  [place]  [place], [place] [address]  Main: [phone]  Fax: [phone]   Gastroenterology Consultation  Referring Provider:     Ardine Eng, MD Primary Care Physician:  Ardine Eng, MD Primary Gastroenterologist:  Dr. [name]  Reason for Consultation:   Constipation        HPI:   Colin FLINN Sr. is a 71 y.o. y/o male referred for consultation & management  by Dr. Ardine Eng, MD.    He states that he is here today to see me for 2 reason constipation and chest discomfort.  He states that the discomfort is going on for a few months.  Describes it as a pressure in his chest sometimes goes to his neck.  Not associated with shortness of breath.  Associated with dizziness and blurring of vision can occur at rest.  He is on Prilosec 40 mg twice a day.  Denies any chest burning sensation.  He states he was seen by cardiologist but I reviewed epic notes he has not seen one for over a year.  He also suffers from constipation has been prescribed Linzess 145 mcg.  When he takes it he gets diarrhea.  So takes it as needed.   04/18/2021: Colonoscopy:3 polyps 5 to 7 mm in size resected in the ascending colon and 3 polyps in the transverse colon 5 to 7 mm in size resected 2 polyps in the descending colon 4 to 6 mm in size resected.  Recommended colonoscopy in 3 years.  The polyps were all tubular adenomas    Past Medical History:  Diagnosis Date   Arthritis    BPH (benign prostatic hyperplasia)    Cancer ([place]) 2015   Prostate CA   Cancer ([place]) 1996   left Kidney   GERD (gastroesophageal reflux disease)    High cholesterol    History of kidney stones    left kidney several times   Hypertension    Obese    Pre-diabetes    Sleep apnea    Vitamin D deficiency     Past Surgical History:  Procedure Laterality Date   COLONOSCOPY     COLONOSCOPY     COLONOSCOPY WITH PROPOFOL N/A 09/09/2016   Procedure: COLONOSCOPY WITH  PROPOFOL;  Surgeon: Lollie Sails, MD;  Location: [facility] ENDOSCOPY;  Service: Endoscopy;  Laterality: N/A;   COLONOSCOPY WITH PROPOFOL N/A 04/18/2021   Procedure: COLONOSCOPY WITH PROPOFOL;  Surgeon: [name], MD;  Location: [facility] ENDOSCOPY;  Service: Gastroenterology;  Laterality: N/A;   ESOPHAGOGASTRODUODENOSCOPY (EGD) WITH PROPOFOL N/A 01/13/2018   Procedure: ESOPHAGOGASTRODUODENOSCOPY (EGD) WITH PROPOFOL;  Surgeon: [name], MD;  Location: [facility] ENDOSCOPY;  Service: Gastroenterology;  Laterality: N/A;   Partial kidney resection  1996   Prostate seed implantation  2015   TOTAL KNEE ARTHROPLASTY Left 02/19/2017   Procedure: TOTAL KNEE ARTHROPLASTY;  Surgeon: Hessie Knows, MD;  Location: [facility] ORS;  Service: Orthopedics;  Laterality: Left;   TOTAL KNEE ARTHROPLASTY Left 04/07/2017   Procedure: MEDIAL RETINACULAR REPAIR;  Surgeon: Hessie Knows, MD;  Location: [facility] ORS;  Service: Orthopedics;  Laterality: Left;    Prior to Admission medications   Medication Sig Start Date End Date Taking? Authorizing Provider  acetaminophen (TYLENOL) 500 MG tablet Take 500 mg by mouth 2 (two) times daily.    [provider]  amLODipine (NORVASC) 10 MG tablet Take 10 mg by mouth at bedtime.     [provider]  aspirin EC 81 MG tablet Take 81 mg by mouth daily.    [provider]  atorvastatin (LIPITOR) 40 MG tablet Take 40 mg by mouth daily. 09/02/21   [provider]  Cholecalciferol (VITAMIN D-1000 MAX ST) 25 MCG (1000 UT) tablet Take 1 tablet by mouth daily.    [provider]  cyanocobalamin (VITAMIN B12) 1000 MCG tablet Take 1 tablet by mouth daily.    [provider]  docusate sodium (COLACE) 100 MG capsule Take 200 mg by mouth daily.    [provider]  DULoxetine (CYMBALTA) 60 MG capsule TAKE 1 CAPSULE BY MOUTH ONCE DAILY FOR MOOD AND PAIN 03/05/18   [provider]  fluticasone (FLONASE) 50 MCG/ACT nasal spray Place 2 sprays  into the nose daily. 05/15/15   [provider]  furosemide (LASIX) 20 MG tablet Take 20 mg by mouth daily. In  Am.    [provider]  JARDIANCE 10 MG TABS tablet Take 10 mg by mouth daily. 08/15/21   [provider]  loratadine (CLARITIN) 10 MG tablet Take 1 tablet by mouth daily. 09/26/19   [provider]  losartan (COZAAR) 100 MG tablet Take 1 tablet by mouth daily. 08/01/21   [provider]  losartan-hydrochlorothiazide (HYZAAR) 100-25 MG tablet Take 1 tablet by mouth daily.    [provider]  meclizine (ANTIVERT) 25 MG tablet Take 1 tablet (25 mg total) by mouth 2 (two) times daily as needed for dizziness. 02/01/15   Fritzi Mandes, MD  mirtazapine (REMERON) 15 MG tablet Take 1 tablet by mouth daily. 06/26/21   [provider]  nitroGLYCERIN (NITROSTAT) 0.4 MG SL tablet Place 0.4 mg under the tongue every 5 (five) minutes as needed for chest pain.    [provider]  Omega-3 Fatty Acids (OMEGA-3 FISH OIL) 500 MG CAPS Take 1 tablet by mouth 1 day or 1 dose.    [provider]  omeprazole (PRILOSEC) 40 MG capsule Take 1 capsule by mouth in the morning and at bedtime.    [provider]  polyethylene glycol (MIRALAX / GLYCOLAX) packet Take 17 g by mouth daily as needed for mild constipation.     [provider]  senna (SENOKOT) 8.6 MG tablet Take 2 tablets by mouth. 08/25/19   [provider]  spironolactone (ALDACTONE) 25 MG tablet Take 1 tablet by mouth daily. 06/26/21   [provider]  tamsulosin (FLOMAX) 0.4 MG CAPS capsule Take 0.4 mg by mouth daily.    [provider]  Tdap Durwin Reges) 5-2.5-18.5 LF-MCG/0.5 injection  07/31/14   [provider]  traMADol (ULTRAM) 50 MG tablet Take 50 mg by mouth every 8 (eight) hours as needed. 08/12/21   [provider]    Family History  Problem Relation Age of Onset   Colon cancer Mother    Lung cancer Father     Benign prostatic hyperplasia Father      Social History   Tobacco Use   Smoking status: Never   Smokeless tobacco: Never  Vaping Use   Vaping Use: Never used  Substance Use Topics   Alcohol use: Yes    Comment: rarely   Drug use: No    Allergies as of 10/07/2021   (No Known Allergies)    Review of Systems:    All systems reviewed and negative except where noted in HPI.   Physical Exam:  There were no vitals taken for this visit. No LMP for male patient. Psych:  Alert and cooperative. Normal mood and affect. General:   Alert,  Well-developed, well-nourished, pleasant and cooperative in NAD Head:  Normocephalic and atraumatic. Eyes:  Sclera clear, no icterus.   Conjunctiva pink. Neurologic:  Alert and oriented x3;  grossly normal neurologically. Psych:  Alert and cooperative. Normal mood and affect.  Imaging Studies: No results found.  Assessment and Plan:   Colin SIDES Sr. is a 71 y.o. y/o male has been referred for chest discomfort and constipation.  His symptoms of chest discomfort are not typical of acid reflux nor have they responded to omeprazole 40 mg twice a day.  This makes it less likely related to reflux.  His chest pressure radiating to his neck, blurring of vision and lightheadedness is concerning if he is having angina.  He has not seen a cardiologist for over a year.  Plan 1.  Refer to cardiology to rule out angina: Urgent 2.  For the GI point of view we will change his PPI from Prilosec to Nexium to 40 mg twice a day.  If he does not respond to this and cardiology ruled out any cardiac issues then will require endoscopy. 3.  For his constipation he has already had a colonoscopy within the last 1 year does not require another 1 Linzess seems to be causing him diarrhea at a dose of 145 mcg/day hence we will reduce the dose to 72 mcg/day we will give him a sample for a week and if it works he should call us for prescription.   Follow up in 3 to 4 months  Dr  [name] MD,MRCP(U.K)

## 2021-10-31 ENCOUNTER — Telehealth: Payer: Self-pay | Admitting: Gastroenterology

## 2021-10-31 NOTE — Telephone Encounter (Signed)
Patient was here on 10/07/2021, he states Dr Vicente Males sent him home with samples of linzess '72mg'$  and states the medication did well for him. Patient requests a call to talk about getting a prescription.

## 2021-11-01 ENCOUNTER — Other Ambulatory Visit: Payer: Self-pay

## 2021-11-01 MED ORDER — LINACLOTIDE 145 MCG PO CAPS: 145.0000 ug | ORAL_CAPSULE | Freq: Every day | ORAL | 11 refills | Status: DC

## 2021-11-01 MED ORDER — LINACLOTIDE 72 MCG PO CAPS: 72.0000 ug | ORAL_CAPSULE | Freq: Every day | ORAL | 11 refills | Status: DC

## 2021-11-01 NOTE — Telephone Encounter (Signed)
Called patient but he did not answ. I left him a detailed message letting him know that the prescription was sent to his pharmacy [facility] in [place] and if he had further questions, to please call us back.

## 2021-11-01 NOTE — Addendum Note (Signed)
Addended by: Wayna Chalet on: 11/01/2021 08:52 AM   Modules accepted: Orders

## 2021-11-25 ENCOUNTER — Emergency Department: Payer: Medicare HMO

## 2021-11-25 ENCOUNTER — Other Ambulatory Visit: Payer: Self-pay

## 2021-11-25 DIAGNOSIS — R0789 Other chest pain: Secondary | ICD-10-CM | POA: Insufficient documentation

## 2021-11-25 DIAGNOSIS — G8929 Other chronic pain: Secondary | ICD-10-CM | POA: Insufficient documentation

## 2021-11-25 DIAGNOSIS — Z85528 Personal history of other malignant neoplasm of kidney: Secondary | ICD-10-CM | POA: Diagnosis not present

## 2021-11-25 DIAGNOSIS — Z79899 Other long term (current) drug therapy: Secondary | ICD-10-CM | POA: Insufficient documentation

## 2021-11-25 DIAGNOSIS — R1084 Generalized abdominal pain: Secondary | ICD-10-CM | POA: Insufficient documentation

## 2021-11-25 DIAGNOSIS — I10 Essential (primary) hypertension: Secondary | ICD-10-CM | POA: Insufficient documentation

## 2021-11-25 DIAGNOSIS — Z96652 Presence of left artificial knee joint: Secondary | ICD-10-CM | POA: Insufficient documentation

## 2021-11-25 DIAGNOSIS — Z8546 Personal history of malignant neoplasm of prostate: Secondary | ICD-10-CM | POA: Insufficient documentation

## 2021-11-25 DIAGNOSIS — R0602 Shortness of breath: Secondary | ICD-10-CM | POA: Insufficient documentation

## 2021-11-25 LAB — CBC WITH DIFFERENTIAL/PLATELET
Abs Immature Granulocytes: 0.01 10*3/uL (ref 0.00–0.07)
Basophils Absolute: 0 10*3/uL (ref 0.0–0.1)
Basophils Relative: 1 %
Eosinophils Absolute: 0 10*3/uL (ref 0.0–0.5)
Eosinophils Relative: 0 %
HCT: 42.7 % (ref 39.0–52.0)
Hemoglobin: 14 g/dL (ref 13.0–17.0)
Immature Granulocytes: 0 %
Lymphocytes Relative: 16 %
Lymphs Abs: 1.2 10*3/uL (ref 0.7–4.0)
MCH: 31.6 pg (ref 26.0–34.0)
MCHC: 32.8 g/dL (ref 30.0–36.0)
MCV: 96.4 fL (ref 80.0–100.0)
Monocytes Absolute: 0.5 10*3/uL (ref 0.1–1.0)
Monocytes Relative: 7 %
Neutro Abs: 5.6 10*3/uL (ref 1.7–7.7)
Neutrophils Relative %: 76 %
Platelets: 208 10*3/uL (ref 150–400)
RBC: 4.43 MIL/uL (ref 4.22–5.81)
RDW: 14.5 % (ref 11.5–15.5)
WBC: 7.3 10*3/uL (ref 4.0–10.5)
nRBC: 0 % (ref 0.0–0.2)

## 2021-11-25 LAB — COMPREHENSIVE METABOLIC PANEL
ALT: 18 U/L (ref 0–44)
AST: 23 U/L (ref 15–41)
Albumin: 3.9 g/dL (ref 3.5–5.0)
Alkaline Phosphatase: 88 U/L (ref 38–126)
Anion gap: 13 (ref 5–15)
BUN: 8 mg/dL (ref 8–23)
CO2: 27 mmol/L (ref 22–32)
Calcium: 9.6 mg/dL (ref 8.9–10.3)
Chloride: 104 mmol/L (ref 98–111)
Creatinine, Ser: 1.14 mg/dL (ref 0.61–1.24)
GFR, Estimated: >60 mL/min (ref >60)
Glucose, Bld: 124 mg/dL — ABNORMAL HIGH (ref 70–99)
Potassium: 3.6 mmol/L (ref 3.5–5.1)
Sodium: 144 mmol/L (ref 135–145)
Total Bilirubin: 0.7 mg/dL (ref 0.3–1.2)
Total Protein: 7.9 g/dL (ref 6.5–8.1)

## 2021-11-25 LAB — BRAIN NATRIURETIC PEPTIDE: B Natriuretic Peptide: 40.8 pg/mL (ref 0.0–100.0)

## 2021-11-25 LAB — TROPONIN I (HIGH SENSITIVITY): Troponin I (High Sensitivity): 12 ng/L (ref <18)

## 2021-11-25 NOTE — ED Provider Triage Note (Signed)
  Emergency Medicine Provider Triage Evaluation Note  AVIS MCMAHILL Sr. , a 71 y.o.male,  was evaluated in triage.  Pt complains of constant chest pain x4 days.  Denies any shortness of breath.  Denies any recent injuries or illnesses.  History of heart failure.     Review of Systems  Positive: Chest pain Negative: Denies fever, abdominal pain, vomiting  Physical Exam  There were no vitals filed for this visit. Gen:   Awake, no distress   Resp:  Normal effort  MSK:   Moves extremities without difficulty  Other:    Medical Decision Making  Given the patient's initial medical screening exam, the following diagnostic evaluation has been ordered. The patient will be placed in the appropriate treatment space, once one is available, to complete the evaluation and treatment. I have discussed the plan of care with the patient and I have advised the patient that an ED physician or mid-level practitioner will reevaluate their condition after the test results have been received, as the results may give them additional insight into the type of treatment they may need.    Diagnostics: Labs, EKG, CXR  Treatments: none immediately   Teodoro Spray, [place] 11/25/21 1718

## 2021-11-25 NOTE — ED Triage Notes (Signed)
Pt come with c/o increased SOb. Pt states this all started Sunday morning. Pt states he is having some chest tightness.

## 2021-11-26 ENCOUNTER — Emergency Department: Payer: Medicare HMO

## 2021-11-26 ENCOUNTER — Emergency Department
Admission: EM | Admit: 2021-11-26 | Discharge: 2021-11-26 | Disposition: A | Discharge status: Home / Self Care | Payer: Medicare HMO | Attending: Emergency Medicine | Admitting: Emergency Medicine

## 2021-11-26 DIAGNOSIS — G8929 Other chronic pain: Secondary | ICD-10-CM

## 2021-11-26 LAB — URINALYSIS, ROUTINE W REFLEX MICROSCOPIC
Bacteria, UA: NONE SEEN
Bilirubin Urine: NEGATIVE
Glucose, UA: >=500 mg/dL — AB
Hgb urine dipstick: NEGATIVE
Ketones, ur: 5 mg/dL — AB
Leukocytes,Ua: NEGATIVE
Nitrite: NEGATIVE
Protein, ur: NEGATIVE mg/dL
Specific Gravity, Urine: >1.046 — ABNORMAL HIGH (ref 1.005–1.030)
pH: 6 (ref 5.0–8.0)

## 2021-11-26 LAB — TROPONIN I (HIGH SENSITIVITY): Troponin I (High Sensitivity): 11 ng/L (ref <18)

## 2021-11-26 LAB — LIPASE, BLOOD: Lipase: 27 U/L (ref 11–51)

## 2021-11-26 MED ORDER — IOHEXOL 350 MG/ML SOLN
100.0000 mL | Freq: Once | INTRAVENOUS | Status: AC | PRN
  Administered 2021-11-26: 100 mL via INTRAVENOUS

## 2021-11-26 MED ORDER — ALUM & MAG HYDROXIDE-SIMETH 200-200-20 MG/5ML PO SUSP
30.0000 mL | Freq: Once | ORAL | Status: AC
  Administered 2021-11-26: 30 mL via ORAL
  Filled 2021-11-26: qty 30

## 2021-11-26 NOTE — ED Provider Notes (Signed)
[facility] Provider Note    Event Date/Time   First MD Initiated Contact with Patient 11/26/21 0102     (approximate)   History   Shortness of Breath   HPI  Colin GAMMEL Sr. is a 71 y.o. male with history of prostate cancer, hypertension, hyperlipidemia, prediabetes, obesity who presents to the emergency department with complaints of anterior chest pressure for the past 4 to 5 days.  States he has had these symptoms intermittently for the past year and normally they wax and wane but seem to be more persistent.  Has been seen by his cardiologist at [facility] and states he has had cardiac MRI and was told he had "no blockages" and that his symptoms were not from his heart.  He has also seen his gastroenterologist.  He had a CT of his abdomen pelvis in July 2023 at [facility] which showed a lesion on the right kidney.  This was concerning for neoplasm.  He has seen urology who recommends repeating the imaging at the beginning of the year to ensure no change.  He states sometimes he will have discomfort that goes into his abdomen is having that pain currently.  Describes it as a pressure as well.  No vomiting, diarrhea, urinary symptoms.  No fever.  Has had shortness of breath and productive cough.  States symptoms have previously improved with simethicone.   History provided by patient.    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   GERD (gastroesophageal reflux disease)    High cholesterol    History of kidney stones    left kidney several times   Hypertension    Obese    Pre-diabetes    Sleep apnea    Vitamin D deficiency     Past Surgical History:  Procedure Laterality Date   COLONOSCOPY     COLONOSCOPY     COLONOSCOPY WITH PROPOFOL N/A 09/09/2016   Procedure: COLONOSCOPY WITH PROPOFOL;  Surgeon: Lollie Sails, MD;  Location: [facility]  ENDOSCOPY;  Service: Endoscopy;  Laterality: N/A;   COLONOSCOPY WITH PROPOFOL N/A 04/18/2021   Procedure: COLONOSCOPY WITH PROPOFOL;  Surgeon: [name], MD;  Location: [facility] ENDOSCOPY;  Service: Gastroenterology;  Laterality: N/A;   ESOPHAGOGASTRODUODENOSCOPY (EGD) WITH PROPOFOL N/A 01/13/2018   Procedure: ESOPHAGOGASTRODUODENOSCOPY (EGD) WITH PROPOFOL;  Surgeon: [name], MD;  Location: [facility] ENDOSCOPY;  Service: Gastroenterology;  Laterality: N/A;   Partial kidney resection  1996   Prostate seed implantation  2015   TOTAL KNEE ARTHROPLASTY Left 02/19/2017   Procedure: TOTAL KNEE ARTHROPLASTY;  Surgeon: Hessie Knows, MD;  Location: [facility] ORS;  Service: Orthopedics;  Laterality: Left;   TOTAL KNEE ARTHROPLASTY Left 04/07/2017   Procedure: MEDIAL RETINACULAR REPAIR;  Surgeon: Hessie Knows, MD;  Location: [facility] ORS;  Service: Orthopedics;  Laterality: Left;    MEDICATIONS:  Prior to Admission medications   Medication Sig Start Date End Date Taking? Authorizing Provider  acetaminophen (TYLENOL) 500 MG tablet Take 500 mg by mouth 2 (two) times daily.    [provider]  amLODipine (NORVASC) 10 MG tablet Take 10 mg by mouth at bedtime.     [provider]  atorvastatin (LIPITOR) 40 MG tablet Take 40 mg by mouth daily. 09/02/21   [provider]  Cholecalciferol (VITAMIN D-1000 MAX ST) 25 MCG (1000 UT) tablet Take  1 tablet by mouth daily.    [provider]  cyanocobalamin (VITAMIN B12) 1000 MCG tablet Take 1 tablet by mouth daily.    [provider]  docusate sodium (COLACE) 100 MG capsule Take 200 mg by mouth daily.    [provider]  esomeprazole (NEXIUM) 40 MG capsule Take 1 capsule (40 mg total) by mouth 2 (two) times daily before a meal. 10/07/21   [name], MD  fluticasone [facility]) 50 MCG/ACT nasal spray Place 2 sprays into the nose daily. 05/15/15   [provider]  JARDIANCE 10 MG TABS tablet Take 10 mg by mouth daily.  08/15/21   [provider]  linaclotide Rolan Lipa) 72 MCG capsule Take 1 capsule (72 mcg total) by mouth daily before breakfast. 11/01/21   [name], MD  meclizine (ANTIVERT) 25 MG tablet Take 1 tablet (25 mg total) by mouth 2 (two) times daily as needed for dizziness. 02/01/15   Fritzi Mandes, MD  mirtazapine (REMERON) 15 MG tablet Take 1 tablet by mouth daily. 06/26/21   [provider]  nitroGLYCERIN (NITROSTAT) 0.4 MG SL tablet Place 0.4 mg under the tongue every 5 (five) minutes as needed for chest pain.    [provider]  Omega-3 Fatty Acids (OMEGA-3 FISH OIL) 500 MG CAPS Take 1 tablet by mouth 1 day or 1 dose.    [provider]  polyethylene glycol (MIRALAX / GLYCOLAX) packet Take 17 g by mouth daily as needed for mild constipation.     [provider]  tamsulosin (FLOMAX) 0.4 MG CAPS capsule Take 0.4 mg by mouth daily.    [provider]    Physical Exam   Triage Vital Signs: ED Triage Vitals  Enc Vitals Group     BP 11/25/21 1828 135/77     Pulse Rate 11/25/21 1828 79     Resp 11/25/21 1828 18     Temp 11/25/21 1828 98.6 F (37 C)     Temp Source 11/25/21 2150 Oral     SpO2 11/25/21 1828 100 %     Weight --      Height --      Head Circumference --      Peak Flow --      Pain Score 11/25/21 1746 4     Pain Loc --      Pain Edu? --      Excl. in [place]? --     Most recent vital signs: Vitals:   11/25/21 2345 11/26/21 0249  BP: 137/82 126/68  Pulse: 65 (!) 59  Resp: 17 18  Temp: 98.4 F (36.9 C)   SpO2: 100% 98%    CONSTITUTIONAL: Alert and oriented and responds appropriately to questions.  Obese, elderly, in no distress HEAD: Normocephalic, atraumatic EYES: Conjunctivae clear, pupils appear equal, sclera nonicteric ENT: normal nose; moist mucous membranes NECK: Supple, normal ROM CARD: RRR; S1 and S2 appreciated; no murmurs, no clicks, no rubs, no gallops RESP: Normal chest excursion without splinting or  tachypnea; breath sounds clear and equal bilaterally; no wheezes, no rhonchi, no rales, no hypoxia or respiratory distress, speaking full sentences ABD/GI: Normal bowel sounds; non-distended; soft, tender to palpation diffusely throughout the abdomen without guarding or rebound BACK: The back appears normal EXT: Normal ROM in all joints; no deformity noted, no edema; no cyanosis, no calf tenderness or calf swelling SKIN: Normal color for age and race; warm; no rash on exposed skin NEURO: Moves all extremities equally, normal speech PSYCH: The  patient's mood and manner are appropriate.   ED Results / Procedures / Treatments   LABS: (all labs ordered are listed, but only abnormal results are displayed) Labs Reviewed  COMPREHENSIVE METABOLIC PANEL - Abnormal; Notable for the following components:      Result Value   Glucose, Bld 124 (*)    All other components within normal limits  URINALYSIS, ROUTINE W REFLEX MICROSCOPIC - Abnormal; Notable for the following components:   Color, Urine YELLOW (*)    APPearance CLEAR (*)    Specific Gravity, Urine >1.046 (*)    Glucose, UA >=500 (*)    Ketones, ur 5 (*)    All other components within normal limits  CBC WITH DIFFERENTIAL/PLATELET  BRAIN NATRIURETIC PEPTIDE  LIPASE, BLOOD  TROPONIN I (HIGH SENSITIVITY)  TROPONIN I (HIGH SENSITIVITY)     EKG:  EKG Interpretation  Date/Time:  Monday November 25 2021 20:29:34 EDT Ventricular Rate:  63 PR Interval:  208 QRS Duration: 74 QT Interval:  428 QTC Calculation: 437 R Axis:   -37 Text Interpretation: Normal sinus rhythm Left axis deviation Minimal voltage criteria for LVH, may be normal variant ( R in aVL ) Inferior infarct , age undetermined Anterior infarct , age undetermined Abnormal ECG When compared with ECG of 13-Jun-2020 16:47, PREVIOUS ECG IS PRESENT Confirmed by Pryor Curia [phone]) on 11/26/2021 1:09:39 AM         RADIOLOGY: My personal review and interpretation of imaging:  Imaging shows no acute abnormality.  I have personally reviewed all radiology reports.   CT Angio Chest PE W and/or Wo Contrast  Result Date: 11/26/2021 CLINICAL DATA:  Pulmonary embolus suspected. Unknown D-dimer. Increasing shortness of breath. Chest tightness. Acute nonlocalized abdominal pain. EXAM: CT ANGIOGRAPHY CHEST CT ABDOMEN AND PELVIS WITH CONTRAST TECHNIQUE: Multidetector CT imaging of the chest was performed using the standard protocol during bolus administration of intravenous contrast. Multiplanar CT image reconstructions and MIPs were obtained to evaluate the vascular anatomy. Multidetector CT imaging of the abdomen and pelvis was performed using the standard protocol during bolus administration of intravenous contrast. RADIATION DOSE REDUCTION: This exam was performed according to the departmental dose-optimization program which includes automated exposure control, adjustment of the mA and/or kV according to patient size and/or use of iterative reconstruction technique. CONTRAST:  139m OMNIPAQUE IOHEXOL 350 MG/ML SOLN COMPARISON:  CT chest 06/13/2020.  CT abdomen and pelvis 09/17/2004 FINDINGS: CTA CHEST FINDINGS Cardiovascular: Good opacification of the central and segmental pulmonary arteries. No focal filling defects. No evidence of significant pulmonary embolus. Normal heart size. No pericardial effusions. Normal caliber thoracic aorta. Aortic and coronary artery calcifications. Mediastinum/Nodes: No enlarged mediastinal, hilar, or axillary lymph nodes. Thyroid gland, trachea, and esophagus demonstrate no significant findings. Lungs/Pleura: Motion artifact limits examination. Mild linear atelectasis in the lung bases. No edema or consolidation. No pleural effusions. No pneumothorax. Musculoskeletal: Degenerative changes in the spine. No destructive bone lesions. Review of the MIP images confirms the above findings. CT ABDOMEN and PELVIS FINDINGS Hepatobiliary: No focal liver abnormality  is seen. No gallstones, gallbladder wall thickening, or biliary dilatation. Pancreas: Unremarkable. No pancreatic ductal dilatation or surrounding inflammatory changes. Spleen: Normal in size without focal abnormality. Adrenals/Urinary Tract: No adrenal gland nodules. Left suprarenal surgical clips. Multiple cysts in both kidneys, largest measuring 5.2 cm diameter. Some have increased density consistent with hemorrhagic cyst. No imaging follow-up is indicated. No hydronephrosis or hydroureter. Bladder is normal. Stomach/Bowel: Stomach, small bowel, and colon are not abnormally distended. No wall thickening or  inflammatory changes. Colonic diverticula without evidence of acute diverticulitis. Appendix is normal. Vascular/Lymphatic: Aortic atherosclerosis. No enlarged abdominal or pelvic lymph nodes. Reproductive: Prostate gland is not enlarged. Prostate seed implants are present. Other: No abdominal wall hernia or abnormality. No abdominopelvic ascites. Musculoskeletal: Degenerative changes in the spine. No destructive bone lesions. Review of the MIP images confirms the above findings. IMPRESSION: 1. No evidence of significant pulmonary embolus. 2. No evidence of active pulmonary disease. 3. No acute process demonstrated in the abdomen or pelvis. No evidence of bowel obstruction or inflammation. 4. Aortic atherosclerosis. Electronically Signed   By: Lucienne Capers M.D.   On: 11/26/2021 02:54   CT ABDOMEN PELVIS W CONTRAST  Result Date: 11/26/2021 CLINICAL DATA:  Pulmonary embolus suspected. Unknown D-dimer. Increasing shortness of breath. Chest tightness. Acute nonlocalized abdominal pain. EXAM: CT ANGIOGRAPHY CHEST CT ABDOMEN AND PELVIS WITH CONTRAST TECHNIQUE: Multidetector CT imaging of the chest was performed using the standard protocol during bolus administration of intravenous contrast. Multiplanar CT image reconstructions and MIPs were obtained to evaluate the vascular anatomy. Multidetector CT imaging  of the abdomen and pelvis was performed using the standard protocol during bolus administration of intravenous contrast. RADIATION DOSE REDUCTION: This exam was performed according to the departmental dose-optimization program which includes automated exposure control, adjustment of the mA and/or kV according to patient size and/or use of iterative reconstruction technique. CONTRAST:  142m OMNIPAQUE IOHEXOL 350 MG/ML SOLN COMPARISON:  CT chest 06/13/2020.  CT abdomen and pelvis 09/17/2004 FINDINGS: CTA CHEST FINDINGS Cardiovascular: Good opacification of the central and segmental pulmonary arteries. No focal filling defects. No evidence of significant pulmonary embolus. Normal heart size. No pericardial effusions. Normal caliber thoracic aorta. Aortic and coronary artery calcifications. Mediastinum/Nodes: No enlarged mediastinal, hilar, or axillary lymph nodes. Thyroid gland, trachea, and esophagus demonstrate no significant findings. Lungs/Pleura: Motion artifact limits examination. Mild linear atelectasis in the lung bases. No edema or consolidation. No pleural effusions. No pneumothorax. Musculoskeletal: Degenerative changes in the spine. No destructive bone lesions. Review of the MIP images confirms the above findings. CT ABDOMEN and PELVIS FINDINGS Hepatobiliary: No focal liver abnormality is seen. No gallstones, gallbladder wall thickening, or biliary dilatation. Pancreas: Unremarkable. No pancreatic ductal dilatation or surrounding inflammatory changes. Spleen: Normal in size without focal abnormality. Adrenals/Urinary Tract: No adrenal gland nodules. Left suprarenal surgical clips. Multiple cysts in both kidneys, largest measuring 5.2 cm diameter. Some have increased density consistent with hemorrhagic cyst. No imaging follow-up is indicated. No hydronephrosis or hydroureter. Bladder is normal. Stomach/Bowel: Stomach, small bowel, and colon are not abnormally distended. No wall thickening or inflammatory  changes. Colonic diverticula without evidence of acute diverticulitis. Appendix is normal. Vascular/Lymphatic: Aortic atherosclerosis. No enlarged abdominal or pelvic lymph nodes. Reproductive: Prostate gland is not enlarged. Prostate seed implants are present. Other: No abdominal wall hernia or abnormality. No abdominopelvic ascites. Musculoskeletal: Degenerative changes in the spine. No destructive bone lesions. Review of the MIP images confirms the above findings. IMPRESSION: 1. No evidence of significant pulmonary embolus. 2. No evidence of active pulmonary disease. 3. No acute process demonstrated in the abdomen or pelvis. No evidence of bowel obstruction or inflammation. 4. Aortic atherosclerosis. Electronically Signed   By: WLucienne CapersM.D.   On: 11/26/2021 02:54   DG Chest 2 View  Result Date: 11/25/2021 CLINICAL DATA:  Chest pain EXAM: CHEST - 2 VIEW COMPARISON:  06/13/2020 FINDINGS: Lungs are clear.  No pleural effusion or pneumothorax. Eventration left hemidiaphragm, chronic. The heart is normal in size. Degenerative  changes of the visualized thoracolumbar spine. IMPRESSION: Normal chest radiographs. Electronically Signed   By: Julian Hy M.D.   On: 11/25/2021 17:43     PROCEDURES:  Critical Care performed: No    .1-3 Lead EKG Interpretation  Performed by: Tomie Elko, Delice Bison, DO Authorized by: Harjit Douds, Delice Bison, DO     Interpretation: normal     ECG rate:  65   ECG rate assessment: normal     Rhythm: sinus rhythm     Ectopy: none     Conduction: normal       IMPRESSION / MDM / ASSESSMENT AND PLAN / ED COURSE  I reviewed the triage vital signs and the nursing notes.    Patient here with complaints of intermittent chest and abdominal pain that have been ongoing for the past year but now more persistent.  The patient is on the cardiac monitor to evaluate for evidence of arrhythmia and/or significant heart rate changes.   DIFFERENTIAL DIAGNOSIS (includes but not  limited to):   ACS, PE, dissection, pneumothorax, pneumonia, colitis, bowel obstruction, malignancy, UTI, cholelithiasis, pancreatitis, gastritis, GERD   Patient's presentation is most consistent with acute presentation with potential threat to life or bodily function.   PLAN: CBC shows no leukocytosis.  Normal electrolytes and renal function.  LFTs normal.  We will add on lipase.  First troponin negative.  Second pending.  BNP negative.  Chest x-ray reviewed and interpreted by myself and the radiologist and shows no acute abnormality.  EKG shows no ischemic abnormality.  Will obtain CTA of the chest to rule out PE given he does have history of prostate cancer and also possible renal neoplasm.  We will also obtain a repeat CT of the abdomen pelvis given he states pain is worsening.  Will give Mylanta here and reassess.   MEDICATIONS GIVEN IN ED: Medications  alum & mag hydroxide-simeth (MAALOX/MYLANTA) 200-200-20 MG/5ML suspension 30 mL (30 mLs Oral Given 11/26/21 0207)  iohexol (OMNIPAQUE) 350 MG/ML injection 100 mL (100 mLs Intravenous Contrast Given 11/26/21 0241)     ED COURSE: He reports feeling better.  Second troponin negative.  CTA of the chest and CT of the abdomen pelvis reviewed and interpreted by myself and the radiologist and shows no acute abnormality.  Lipase normal.  Urine shows no sign of infection.  The patient continues to be well-appearing and hemodynamically stable here.  Given symptoms ongoing for a year, I feel he is safe for discharge home and does not need admission.  He has multiple specialist for outpatient follow-up including a PCP.  Discussed return precautions.   At this time, I do not feel there is any life-threatening condition present. I reviewed all nursing notes, vitals, pertinent previous records.  All lab and urine results, EKGs, imaging ordered have been independently reviewed and interpreted by myself.  I reviewed all available radiology reports from any  imaging ordered this visit.  Based on my assessment, I feel the patient is safe to be discharged home without further emergent workup and can continue workup as an outpatient as needed. Discussed all findings, treatment plan as well as usual and customary return precautions.  They verbalize understanding and are comfortable with this plan.  Outpatient follow-up has been provided as needed.  All questions have been answered.    CONSULTS: Patient's work-up reassuring and he is feeling better after Mylanta.  Symptoms ongoing for over a year.  Does not require admission at this time.   OUTSIDE RECORDS REVIEWED: Reviewed multiple  records from [facility].  CTAP 08/29/21 Impression:  1.  Probable 1.2 cm arterially enhancing right inferior renal lesion.  Recommend urology consult.  2.  Multiple bilateral renal cysts, some of which are proteinaceous versus  hemorrhagic.   Echo 07/31/21:  Summary    1. Technically difficult study.    2. An ultrasound enhancing agent was used to improve the visualization of  the left ventricular cavity and endocardial borders.    3. The left ventricular systolic function is overall normal, LVEF is  visually estimated at 55%.    4. The left ventricle is normal in size with normal wall thickness.    5. There is grade I diastolic dysfunction (impaired relaxation).    6. The right ventricle is normal in size, with normal systolic function.    FINAL CLINICAL IMPRESSION(S) / ED DIAGNOSES   Final diagnoses:  Chronic chest pain  Chronic abdominal pain     Rx / DC Orders   ED Discharge Orders     None        Note:  This document was prepared using Dragon voice recognition software and may include unintentional dictation errors.   Audris Speaker, Delice Bison, DO 11/26/21 [phone]

## 2021-11-26 NOTE — ED Notes (Signed)
Lipase, repeat trop   Wyley Hack, Delice Bison, DO 11/26/21 0228

## 2021-11-26 NOTE — Discharge Instructions (Signed)
Your labs, EKG, urine, CT of your chest and abdomen/pelvis were reassuring today.  I recommend close follow-up with your primary care doctor for further management of your chronic symptoms.  You may use Mylanta over-the-counter as needed for symptoms if this has helped you with your discomfort.

## 2021-12-05 ENCOUNTER — Ambulatory Visit: Payer: Medicare HMO | Admitting: Cardiology

## 2022-01-03 ENCOUNTER — Encounter (HOSPITAL_COMMUNITY): Payer: Self-pay

## 2022-01-03 ENCOUNTER — Ambulatory Visit (INDEPENDENT_AMBULATORY_CARE_PROVIDER_SITE_OTHER)
Admission: EM | Admit: 2022-01-03 | Discharge: 2022-01-03 | Disposition: A | Discharge status: Home / Self Care | Payer: Medicare HMO | Source: Home / Self Care

## 2022-01-03 ENCOUNTER — Other Ambulatory Visit: Payer: Self-pay

## 2022-01-03 ENCOUNTER — Emergency Department (HOSPITAL_COMMUNITY)
Admission: EM | Admit: 2022-01-03 | Discharge: 2022-01-03 | Disposition: A | Discharge status: Home / Self Care | Payer: Medicare HMO | Attending: Emergency Medicine | Admitting: Emergency Medicine

## 2022-01-03 DIAGNOSIS — Z79899 Other long term (current) drug therapy: Secondary | ICD-10-CM | POA: Insufficient documentation

## 2022-01-03 DIAGNOSIS — F419 Anxiety disorder, unspecified: Secondary | ICD-10-CM | POA: Insufficient documentation

## 2022-01-03 DIAGNOSIS — F4323 Adjustment disorder with mixed anxiety and depressed mood: Secondary | ICD-10-CM

## 2022-01-03 DIAGNOSIS — F411 Generalized anxiety disorder: Secondary | ICD-10-CM

## 2022-01-03 NOTE — ED Provider Notes (Signed)
[facility] EMERGENCY DEPARTMENT Provider Note   CSN: [id number] Arrival date & time: 01/03/22  6269     History Chief Complaint  Patient presents with   Anxiety    HPI Colin HERBSTER Sr. is a 71 y.o. male presenting for anxiety.  Patient presents with his brother.  They state that he was evaluated at [facility] in the emergency department approximately a month ago for similar.  He was able to be psychiatrically cleared and discharged.  He is on Atarax 3 times daily and has been taking it but is still suffering from anxiety.  He endorses significant psychosocial distress at home.  He denies any plan to hurt himself at this time.  He denies any homicidal ideation.  He has been having trouble sleeping but he has been tolerating p.o. intake..   Patient's recorded medical, surgical, social, medication list and allergies were reviewed in the Snapshot window as part of the initial history.   Review of Systems   Review of Systems  Constitutional:  Negative for chills and fever.  HENT:  Negative for ear pain and sore throat.   Eyes:  Negative for pain and visual disturbance.  Respiratory:  Negative for cough and shortness of breath.   Cardiovascular:  Negative for chest pain and palpitations.  Gastrointestinal:  Negative for abdominal pain and vomiting.  Genitourinary:  Negative for dysuria and hematuria.  Musculoskeletal:  Negative for arthralgias and back pain.  Skin:  Negative for color change and rash.  Neurological:  Negative for seizures and syncope.  All other systems reviewed and are negative.   Physical Exam Updated Vital Signs BP 138/83 (BP Location: Left Arm)   Pulse 65   Temp 98.3 F (36.8 C) (Oral)   Resp 16   Ht '5\' 9"'$  (1.753 m)   Wt (!) 147.9 kg   SpO2 99%   BMI 48.14 kg/m  Physical Exam Vitals and nursing note reviewed.  Constitutional:      General: He is not in acute distress.    Appearance: He is well-developed.  HENT:     Head: Normocephalic  and atraumatic.  Eyes:     Conjunctiva/sclera: Conjunctivae normal.  Cardiovascular:     Rate and Rhythm: Normal rate and regular rhythm.     Heart sounds: No murmur heard. Pulmonary:     Effort: Pulmonary effort is normal. No respiratory distress.     Breath sounds: Normal breath sounds.  Abdominal:     Palpations: Abdomen is soft.     Tenderness: There is no abdominal tenderness.  Musculoskeletal:        General: No swelling.     Cervical back: Neck supple.  Skin:    General: Skin is warm and dry.     Capillary Refill: Capillary refill takes less than 2 seconds.  Neurological:     Mental Status: He is alert.  Psychiatric:        Mood and Affect: Mood normal.      ED Course/ Medical Decision Making/ A&P    Procedures Procedures   Medications Ordered in ED Medications - No data to display  Medical Decision Making:    Colin MCCARNEY Sr. is a 71 y.o. male who presented to the ED today with anxiety detailed above.     Patient's presentation is complicated by their history of advanced age, elevated BMI.  Patient placed on continuous vitals and telemetry monitoring while in ED which was reviewed periodically.   Complete initial physical exam  performed, notably the patient  was hemodynamically stable in no acute distress.  Patient is alert and oriented.  Patient is linear, appropriate affect.      Reviewed and confirmed nursing documentation for past medical history, family history, social history.    Initial Assessment:   Had a prolonged conversation with patient and brother at bedside.  Ultimately, he does not meet criteria for involuntary commitment at this time.  Offered psychiatric observation but informed patient and family that there would be an extensive wait for evaluation due to ED boarding.  They stated that if they would be sitting around throughout the day they would rather proceed to follow-up with outpatient psychiatry.  Given lack of active suicidal planning I  think this is reasonable.  Resources provided to the patient and family for local psychiatric behavioral health urgent cares that they could proceed to at this time.  Strict return precautions regarding development of suicidal or homicidal ideation reinforced and patient and family expressed understanding.  Disposition:  I have considered need for hospitalization, however, considering all of the above, I believe this patient is stable for discharge at this time.  Patient/family educated about specific return precautions for given chief complaint and symptoms.  Patient/family educated about follow-up with PCP and psych.     Patient/family expressed understanding of return precautions and need for follow-up. Patient spoken to regarding all imaging and laboratory results and appropriate follow up for these results. All education provided in verbal form with additional information in written form. Time was allowed for answering of patient questions. Patient discharged.    Emergency Department Medication Summary:   Medications - No data to display        clinical Impression:  1. Anxiety      Discharge   Final Clinical Impression(s) / ED Diagnoses Final diagnoses:  Anxiety    Rx / DC Orders ED Discharge Orders     None         Tretha Sciara, MD 01/03/22 [phone]

## 2022-01-03 NOTE — Discharge Instructions (Addendum)
Discharge recommendations:  Patient is to take medications as prescribed. Please see information for follow-up appointment with psychiatry and therapy. Please follow up with your primary care provider for all medical related needs.   Therapy: We recommend that patient participate in individual therapy to address mental health concerns.  Medications: The parent/guardian is to contact a medical professional and/or outpatient provider to address any new side effects that develop. Parent/guardian should update outpatient providers of any new medications and/or medication changes.   Safety:  The patient should abstain from use of illicit substances/drugs and abuse of any medications. If symptoms worsen or do not continue to improve or if the patient becomes actively suicidal or homicidal then it is recommended that the patient return to the closest hospital emergency department, the [facility], or call 911 for further evaluation and treatment. National Suicide Prevention Lifeline 1-800-SUICIDE or 970-869-2191.  About 988 988 offers 24/7 access to trained crisis counselors who can help people experiencing mental health-related distress. People can call or text 988 or chat 988lifeline.org for themselves or if they are worried about a loved one who may need crisis support.    Base on the information you have provided and the presenting issue, outpatient services with therapy and psychiatry have been recommended.  It is imperative that you follow through with treatment recommendations within 5-7 days from the of discharge to mitigate further risk to your safety and mental well-being. A list of referrals has been provided below to get you started.  You are not limited to the list provided.  In case of an urgent crisis, you may contact the Mobile Crisis Unit with [facility] at 1.8053955435.  It is the provider's recommendation that you partake in PHP/IOP  given the intensity of your symptoms so that you are provided with daily care and support as you build your coping skills.  [facility] in [place] offers PHP/IOP in person and virtual but you will need to call them to do the intake and brief assessment over the phone.  The same goes for [facility].  [facility]------ 1hr drive from your home. [place], [place], [address] [phone] phone  [facility]------ 45 minute drive from your home. [place], [place], [address] [phone] phone  [place]------ 1hr drive from your home. [place], [place], [address]  [phone] phone  Outpatient Therapy and Psychiatry for Medicare Recipients  [place] ------ Not taking any clients for therapy appointments at this time due to full caseload.  It would be best to call back sometime in December 2023. [address] [place], [place], [address] [phone] phone  [place] ------ 6-8 month wait for therapy appointments at this time/med management would be a few weeks after Thanksgiving. [address], [place], [place], [address] [phone] phone ([address] [place], [place], [place], [place], [employer], [place], [place], [place], [place], [place], [place], [place], [place], [employer], [employer], [place])  [facility] ------ They have appointments two weeks out. [place] [address], [place], [place], [address] [phone] phone  [facility] [address] [place], [place], [address] [phone] phone  [facility] [address] [place], [place], [address] [phone] phone [phone] fax  [facility], [place] [address], [place], [address] [phone] phone  [facility] [place]. [address]  [address] [place], [place], [address] [phone] phone [phone] fax  [facility] [address] [place], [place], [address] [phone] phone  [facility] [address] [place], [place], [address] [phone] phone  [facility]. [place], [place], [address] [phone] phone [phone] fax

## 2022-01-03 NOTE — ED Triage Notes (Signed)
Pt presents to [facility] accompanied by his brother due to worsening anxiety and medication management. Pt was recently seen today at [facility] and was psychiatrically cleared and discharged. Pt states he was referred to this facility for further assistance. Pt states he was prescribed hydroxyzine for anxiety by a PCP Kristine Linea at [facility] and he has been taking them 3x a day for about 1 month. Pt states the medication is not working for him. Pt denies SI/HI and AVH.

## 2022-01-03 NOTE — Discharge Instructions (Addendum)
You were seen today for anxiety. Fortunately, you do not meet criteria for involuntary commitment at this time.  You have been having worsening discomfort at home and worsening anxiety.  You additionally been having trouble sleeping.  I do believe that these warrant evaluation by a long-term psychiatrist.  You have been established in the outpatient setting with [facility], however you have been unable to arrange outpatient appointments in a timely fashion.  I recommend you proceed to the following facility today.  I have additionally attached the resource guide for the local psychiatric providers in the outpatient setting as well.  [facility]  Phone: [phone]  Address: [place], [place] [address]  Hours: Open 24/7, No appointment required.

## 2022-01-03 NOTE — ED Triage Notes (Signed)
Pt arrived POV from home c/o anxiety and some depression. Pt states he does not want to be dead or hurt himself so he decided to come in before he got there.

## 2022-01-03 NOTE — BH Assessment (Signed)
LCSW Progress Note   Per Darrol Angel, NP, this pt does not require psychiatric hospitalization at this time.  Pt is psychiatrically cleared.  Discharge instructions include three other resources for in-person PHP/IOP and several for outpatient mental health services such as therapy and medication management.  EDP Darrol Angel, NP, has been notified.  Omelia Blackwater, MSW, [place] [phone] or [phone]

## 2022-01-03 NOTE — ED Provider Notes (Cosign Needed Addendum)
Behavioral Health Urgent Care Medical Screening Exam  Patient Name: Colin BOUGHNER Sr. MRN: [id number] Date of Evaluation: 01/03/22 Chief Complaint:  anxiety  Diagnosis:  Final diagnoses:  Anxiety state  Adjustment disorder with mixed anxiety and depressed mood    History of Present illness: Colin KIRKER Sr. is a 71 y.o. male patient who presents to the [facility] behavioral health urgent care voluntary accompanied by his brother Osamu Olguin with a chief complaint of anxiety.  Patient seen and evaluated face-to-face by this provider along with his brother Wille Glaser present, chart reviewed and case discussed with Dr. Dwyane Dee.   On evaluation, patient is alert and oriented x 4. His thought process is logical and goal oriented. His speech is clear and coherent at a moderate tone. His mood is anxious and affect is congruent. He has fair eye contact. He is casually dressed. He denies SI/HI/AVH.  There is no objective evidence that the patient is currently responding to internal or external stimuli.  The patient reports worsening anxiety and depressive symptoms for the past month and a half. He identifies current stressors and triggers as "marital affair." He states that about a month ago he confessed to his wife of 62 years that he had an affair one to two years ago with a close friend of his girlfriend. He states that his wife seems to be okay with it but he has been having a hard time functioning.   He describes his anxiety as difficulty sleeping, racing thoughts, excessive worrying, feeling scared, and palpitations. He reports experiencing anxiety attacks several times throughout the day. He describes his depressive symptoms as feelings of guilt, isolating, sadness and poor appetite. He is unable to identify alleviating factors. He identifies thinking too much as an aggravating factor.  Patient states that he's been to the ED twice due to severe anxiety. He states that he was prescribed hydroxyzine 25 mg 3  times a day for anxiety but does not feel any significant reduction in his anxiety. He states that his has an outpatient appointment on December 7th but that is too far out.   Per chart review, patient previously prescribed Remeron 15 mg nightly. Patient states that he was prescribed Remeron by his pcp but he stopped taking the Remeron months ago because he he was told that the medication could cause suicidal thoughts and he doesn't want to be suicidal. He states that he refilled the Remeron last week and that he has the medication at home.  He resides with this wife. He is unemployed and on disability. He denies using illicit drugs or alcohol. He identifies his brother and sister as his support persons.   Plan: Patient recommended for outpatient psychiatry and therapy to address current stressor. Patient referred to to the [facility] however, there are barriers to the patient attending virtual appointments at home due to lack of privacy and inability to access virtual appointments. Patient has an outpatient appointment at [facility] outpatient psychiatry on Jan 23, 2022. Patient encouraged to restart taking the Remeron 15 mg nightly for depression/anxiety/sleep/stimulate appetite. Patient advised to take the hydroxyzine 25 mg po 3 times a day as needed for increased anxiety. Patient provided with additional outpatient resources for psychiatry, in-person PHP and therapy. Patient provided with a hand out on managing and coping with anxiety.     [facility] from 01/03/2022 in [facility] Most recent reading at 01/03/2022  9:16 AM ED from 01/03/2022 in [facility]  Most recent reading at 01/03/2022  7:05 AM ED from 07/30/2021 in [facility]  Most recent reading at 07/30/2021 10:54 AM  C-SSRS RISK CATEGORY No Risk No Risk No Risk       Psychiatric Specialty Exam  Presentation  General Appearance:Appropriate for  Environment  Eye Contact:Fair  Speech:Clear and Coherent  Speech Volume:Normal  Handedness:Right   Mood and Affect  Mood: Anxious  Affect: Congruent   Thought Process  Thought Processes: Coherent  Descriptions of Associations:Intact  Orientation:Full (Time, Place and Person)  Thought Content:Logical    Hallucinations:None  Ideas of Reference:None  Suicidal Thoughts:No  Homicidal Thoughts:No   Sensorium  Memory: Immediate Fair; Remote Fair; Recent Fair  Judgment: Intact  Insight: Fair   Materials engineer: Fair  Attention Span: Fair  Recall:No data recorded Fund of Knowledge: Fair  Language: Fair   Psychomotor Activity  Psychomotor Activity: Normal   Assets  Assets: Armed forces logistics/support/administrative officer; Desire for Improvement; Financial Resources/Insurance; Housing; Intimacy; Leisure Time; Social Support   Sleep  Sleep: Poor  Number of hours:  5   Physical Exam: Physical Exam HENT:     Head: Normocephalic.     Nose: Nose normal.  Eyes:     Conjunctiva/sclera: Conjunctivae normal.  Cardiovascular:     Rate and Rhythm: Normal rate.  Pulmonary:     Effort: Pulmonary effort is normal.  Musculoskeletal:     Cervical back: Normal range of motion.     Comments: Ambulates with a cane.   Neurological:     Mental Status: He is alert and oriented to person, place, and time.    Review of Systems  Constitutional: Negative.   HENT: Negative.    Eyes: Negative.   Respiratory: Negative.    Cardiovascular: Negative.   Gastrointestinal: Negative.   Genitourinary: Negative.   Musculoskeletal: Negative.   Neurological: Negative.   Endo/Heme/Allergies: Negative.    Blood pressure (!) 161/90, pulse 70, temperature 98.5 F (36.9 C), temperature source Oral, resp. rate 18, SpO2 100 %. There is no height or weight on file to calculate BMI.  Musculoskeletal: Strength & Muscle Tone: within normal limits Gait & Station:  normal Patient leans: N/A   [facility] Discharge Disposition for Follow up and Recommendations: Based on my evaluation the patient does not appear to have an emergency medical condition and can be discharged with resources and follow up care in outpatient services for Individual Therapy and [place] ------ Not taking any clients for therapy appointments at this time due to full caseload.  It would be best to call back sometime in December 2023. [address] [place], [place], [address] [phone] phone  [place] ------ 6-8 month wait for therapy appointments at this time/med management would be a few weeks after Thanksgiving. [address] [place], [place], [address] [phone] phone ([address], [place], [place], [place], [place], [employer], [place], [place], [place], [place], [place], [place], [place], [place], [employer], [employer], [place])  [facility] ------ They have appointments two weeks out. [place] [facility] [address], [place], [place], [address] [phone] phone  [facility] [address], [place], [place], [address] [phone] phone  [facility] [address] [place], [place], [address] [phone] phone [phone] fax  [facility], [place] [address], [place], [address] [phone] phone  [facility] [place].  [address] [place], [place], [address] [phone] phone [phone] fax  [facility] [address] [place], [place], [address] [phone] phone  [facility] [address] [place], [place], [address] [phone] phone  [facility] [employer]. [place], [place], [address] [phone] phone [phone] fax   Marissa Calamity, NP 01/03/2022, 10:29 AM

## 2022-01-16 ENCOUNTER — Ambulatory Visit: Payer: Medicare HMO | Admitting: Gastroenterology

## 2022-06-17 ENCOUNTER — Encounter: Payer: Self-pay | Admitting: Ophthalmology

## 2022-06-18 ENCOUNTER — Encounter: Payer: Self-pay | Admitting: Ophthalmology

## 2022-06-18 NOTE — Anesthesia Preprocedure Evaluation (Addendum)
Anesthesia Evaluation  Patient identified by MRN, date of birth, ID band Patient awake    Reviewed: Allergy & Precautions, H&P , NPO status , Patient's Chart, lab work & pertinent test results  Airway Mallampati: III  TM Distance: <3 FB Neck ROM: Full    Dental no notable dental hx. (+) Edentulous Upper, Edentulous Lower   Pulmonary sleep apnea and Continuous Positive Airway Pressure Ventilation  Eventration left diaphragm per radiology  11-25-21   Pulmonary exam normal breath sounds clear to auscultation       Cardiovascular hypertension, Normal cardiovascular exam Rhythm:Regular Rate:Normal  Chronic chest pain per Epic note  Hx bradycardia rate 30s unresponsive to atropine, 1st degree AV block, heart rate sped up with patient walking, per Epic note  TTE 07-22 EF >55%, mild to moderate LVH, interminate disatolic function  07-31-21 EF 55%, grade I diastolic dysfunction   Neuro/Psych Vertigo   Neuromuscular disease negative neurological ROS  negative psych ROS   GI/Hepatic Neg liver ROS,GERD  Controlled and Medicated,,  Endo/Other  diabetes, Type 2    Renal/GU negative Renal ROS  negative genitourinary   Musculoskeletal negative musculoskeletal ROS (+) Arthritis ,    Abdominal   Peds negative pediatric ROS (+)  Hematology negative hematology ROS (+)   Anesthesia Other Findings BPH (benign prostatic hyperplasia) Hypertension Arthritis  GERD (gastroesophageal reflux disease) High cholesterol  Vitamin D deficiency diabetes  Prostate Cancer  Left renal cancer   Morbid obesity Mild CAD Murmur Heart failure w/preserved EF Sleep apnea History of kidney stones  Obese Wears dentures  Suicidal ideation hx Anxiety disorder Adjustment disorder with anxiety and depressed mood Sinus bradycardia rate 41-50 Chronic chest pain Sleep apnea dizziness   Reproductive/Obstetrics negative OB ROS                              Anesthesia Physical Anesthesia Plan  ASA: 3  Anesthesia Plan: MAC   Post-op Pain Management:    Induction: Intravenous  PONV Risk Score and Plan:   Airway Management Planned: Natural Airway and Nasal Cannula  Additional Equipment:   Intra-op Plan:   Post-operative Plan:   Informed Consent: I have reviewed the patients History and Physical, chart, labs and discussed the procedure including the risks, benefits and alternatives for the proposed anesthesia with the patient or authorized representative who has indicated his/her understanding and acceptance.     Dental Advisory Given  Plan Discussed with: Anesthesiologist, CRNA and Surgeon  Anesthesia Plan Comments: (Patient consented for risks of anesthesia including but not limited to:  - adverse reactions to medications - damage to eyes, teeth, lips or other oral mucosa - nerve damage due to positioning  - sore throat or hoarseness - Damage to heart, brain, nerves, lungs, other parts of body or loss of life  Patient voiced understanding.)        Anesthesia Quick Evaluation

## 2022-06-23 NOTE — Discharge Instructions (Signed)

## 2022-06-25 ENCOUNTER — Ambulatory Visit: Payer: Medicare HMO | Admitting: Anesthesiology

## 2022-06-25 ENCOUNTER — Ambulatory Visit
Admission: RE | Admit: 2022-06-25 | Discharge: 2022-06-25 | Disposition: A | Discharge status: Home / Self Care | Payer: Medicare HMO | Attending: Ophthalmology | Admitting: Ophthalmology

## 2022-06-25 ENCOUNTER — Other Ambulatory Visit: Payer: Self-pay

## 2022-06-25 ENCOUNTER — Encounter
Admission: RE | Disposition: A | Discharge status: Home / Self Care | Payer: Self-pay | Source: Home / Self Care | Attending: Ophthalmology

## 2022-06-25 DIAGNOSIS — K219 Gastro-esophageal reflux disease without esophagitis: Secondary | ICD-10-CM | POA: Insufficient documentation

## 2022-06-25 DIAGNOSIS — F4323 Adjustment disorder with mixed anxiety and depressed mood: Secondary | ICD-10-CM | POA: Insufficient documentation

## 2022-06-25 DIAGNOSIS — I10 Essential (primary) hypertension: Secondary | ICD-10-CM | POA: Diagnosis not present

## 2022-06-25 DIAGNOSIS — H2511 Age-related nuclear cataract, right eye: Secondary | ICD-10-CM | POA: Diagnosis present

## 2022-06-25 DIAGNOSIS — M1990 Unspecified osteoarthritis, unspecified site: Secondary | ICD-10-CM | POA: Diagnosis not present

## 2022-06-25 DIAGNOSIS — Z8546 Personal history of malignant neoplasm of prostate: Secondary | ICD-10-CM | POA: Insufficient documentation

## 2022-06-25 DIAGNOSIS — H401113 Primary open-angle glaucoma, right eye, severe stage: Secondary | ICD-10-CM | POA: Insufficient documentation

## 2022-06-25 DIAGNOSIS — I440 Atrioventricular block, first degree: Secondary | ICD-10-CM | POA: Diagnosis not present

## 2022-06-25 DIAGNOSIS — Z85528 Personal history of other malignant neoplasm of kidney: Secondary | ICD-10-CM | POA: Diagnosis not present

## 2022-06-25 DIAGNOSIS — G4730 Sleep apnea, unspecified: Secondary | ICD-10-CM | POA: Diagnosis not present

## 2022-06-25 DIAGNOSIS — E1136 Type 2 diabetes mellitus with diabetic cataract: Secondary | ICD-10-CM | POA: Diagnosis not present

## 2022-06-25 DIAGNOSIS — E7800 Pure hypercholesterolemia, unspecified: Secondary | ICD-10-CM | POA: Insufficient documentation

## 2022-06-25 DIAGNOSIS — G709 Myoneural disorder, unspecified: Secondary | ICD-10-CM | POA: Insufficient documentation

## 2022-06-25 DIAGNOSIS — Z6841 Body Mass Index (BMI) 40.0 and over, adult: Secondary | ICD-10-CM | POA: Diagnosis not present

## 2022-06-25 DIAGNOSIS — I2510 Atherosclerotic heart disease of native coronary artery without angina pectoris: Secondary | ICD-10-CM | POA: Insufficient documentation

## 2022-06-25 HISTORY — DX: Presence of dental prosthetic device (complete) (partial): Z97.2

## 2022-06-25 HISTORY — PX: CATARACT EXTRACTION W/PHACO: SHX586

## 2022-06-25 HISTORY — DX: Other ill-defined heart diseases: I51.89

## 2022-06-25 HISTORY — DX: Other congenital malformations of diaphragm: Q79.1

## 2022-06-25 LAB — GLUCOSE, CAPILLARY: Glucose-Capillary: 120 mg/dL — ABNORMAL HIGH (ref 70–99)

## 2022-06-25 SURGERY — PHACOEMULSIFICATION, CATARACT, WITH IOL INSERTION
Anesthesia: Monitor Anesthesia Care | Site: Eye | Laterality: Right

## 2022-06-25 MED ORDER — SIGHTPATH DOSE#1 BSS IO SOLN
INTRAOCULAR | Status: DC | PRN
  Administered 2022-06-25: 64 mL via OPHTHALMIC

## 2022-06-25 MED ORDER — SIGHTPATH DOSE#1 NA HYALUR & NA CHOND-NA HYALUR IO KIT
PACK | INTRAOCULAR | Status: DC | PRN
  Administered 2022-06-25: 1 via OPHTHALMIC

## 2022-06-25 MED ORDER — FENTANYL CITRATE (PF) 100 MCG/2ML IJ SOLN
INTRAMUSCULAR | Status: DC | PRN
  Administered 2022-06-25: 50 ug via INTRAVENOUS

## 2022-06-25 MED ORDER — SIGHTPATH DOSE#1 BSS IO SOLN
INTRAOCULAR | Status: DC | PRN
  Administered 2022-06-25: 15 mL via INTRAOCULAR

## 2022-06-25 MED ORDER — ARMC OPHTHALMIC DILATING DROPS
1.0000 | OPHTHALMIC | Status: AC | PRN
  Administered 2022-06-25 (×3): 1 via OPHTHALMIC

## 2022-06-25 MED ORDER — MIDAZOLAM HCL 2 MG/2ML IJ SOLN
INTRAMUSCULAR | Status: DC | PRN
  Administered 2022-06-25 (×2): 1 mg via INTRAVENOUS

## 2022-06-25 MED ORDER — SIGHTPATH DOSE#1 NA CHONDROIT SULF-NA HYALURON 20-15 MG/0.5ML IO SOSY
INTRAOCULAR | Status: DC | PRN
  Administered 2022-06-25: .5 mL via INTRAOCULAR

## 2022-06-25 MED ORDER — CEFUROXIME OPHTHALMIC INJECTION 1 MG/0.1 ML
INJECTION | OPHTHALMIC | Status: DC | PRN
  Administered 2022-06-25: 1 mg via INTRACAMERAL

## 2022-06-25 MED ORDER — SIGHTPATH DOSE#1 BSS IO SOLN
INTRAOCULAR | Status: DC | PRN
  Administered 2022-06-25: 2 mL

## 2022-06-25 MED ORDER — TETRACAINE HCL 0.5 % OP SOLN
1.0000 [drp] | OPHTHALMIC | Status: AC | PRN
  Administered 2022-06-25 (×3): 1 [drp] via OPHTHALMIC

## 2022-06-25 SURGICAL SUPPLY — 11 items
BLADE DUAL KAHOOK SINGLE USE (BLADE) IMPLANT
CATARACT SUITE SIGHTPATH (MISCELLANEOUS) ×1 IMPLANT
FEE CATARACT SUITE SIGHTPATH (MISCELLANEOUS) ×1 IMPLANT
GLOVE SRG 8 PF TXTR STRL LF DI (GLOVE) ×1 IMPLANT
GLOVE SURG ENC TEXT LTX SZ7.5 (GLOVE) ×1 IMPLANT
GLOVE SURG UNDER POLY LF SZ8 (GLOVE) ×1
ICLIP (OPHTHALMIC RELATED) IMPLANT
LENS IOL TECNIS EYHANCE 18.0 (Intraocular Lens) IMPLANT
NDL FILTER BLUNT 18X1 1/2 (NEEDLE) ×1 IMPLANT
NEEDLE FILTER BLUNT 18X1 1/2 (NEEDLE) ×1 IMPLANT
SYR 3ML LL SCALE MARK (SYRINGE) ×1 IMPLANT

## 2022-06-25 NOTE — Transfer of Care (Signed)
Immediate Anesthesia Transfer of Care Note  Patient: KOUKI HOLZAPFEL Sr.  Procedure(s) Performed: CATARACT EXTRACTION PHACO AND INTRAOCULAR LENS PLACEMENT (IOC) RIGHT  KAHOOK DUAL BLADE GONIOTOMY 12.37 1:07.7 (Right: Eye)  Patient Location: PACU  Anesthesia Type: MAC  Level of Consciousness: awake, alert  and patient cooperative  Airway and Oxygen Therapy: Patient Spontanous Breathing and Patient connected to supplemental oxygen  Post-op Assessment: Post-op Vital signs reviewed, Patient's Cardiovascular Status Stable, Respiratory Function Stable, Patent Airway and No signs of Nausea or vomiting  Post-op Vital Signs: Reviewed and stable  Complications: No notable events documented.

## 2022-06-25 NOTE — Anesthesia Postprocedure Evaluation (Signed)
Anesthesia Post Note  Patient: Colin GARCIAMARTINEZ Sr.  Procedure(s) Performed: CATARACT EXTRACTION PHACO AND INTRAOCULAR LENS PLACEMENT (IOC) RIGHT  KAHOOK DUAL BLADE GONIOTOMY 12.37 1:07.7 (Right: Eye)  Patient location during evaluation: PACU Anesthesia Type: MAC Level of consciousness: awake and alert Pain management: pain level controlled Vital Signs Assessment: post-procedure vital signs reviewed and stable Respiratory status: spontaneous breathing, nonlabored ventilation, respiratory function stable and patient connected to nasal cannula oxygen Cardiovascular status: stable and blood pressure returned to baseline Postop Assessment: no apparent nausea or vomiting Anesthetic complications: no   No notable events documented.   Last Vitals:  Vitals:   06/25/22 0952 06/25/22 0953  BP: (!) 103/57   Pulse: (!) 46 (!) 44  Resp: 13 13  Temp:    SpO2: 97% 97%    Last Pain:  Vitals:   06/25/22 0952  TempSrc:   PainSc: 0-No pain                 Marisue Humble

## 2022-06-25 NOTE — H&P (Signed)
[facility]   Primary Care Physician:  Arsenio Katz, MD Ophthalmologist: Dr. Lockie Mola  Pre-Procedure History & Physical: HPI:  Colin PUCK Sr. is a 72 y.o. male here for ophthalmic surgery.   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   Diaphragm, eventration    GERD (gastroesophageal reflux disease)    Grade I diastolic dysfunction    High cholesterol    History of kidney stones    left kidney several times   Hypertension    Obese    Pre-diabetes    Sleep apnea    CPAP   Vitamin D deficiency    Wears dentures    full upper and lower    Past Surgical History:  Procedure Laterality Date   COLONOSCOPY     COLONOSCOPY     COLONOSCOPY WITH PROPOFOL N/A 09/09/2016   Procedure: COLONOSCOPY WITH PROPOFOL;  Surgeon: Christena Deem, MD;  Location: [facility] ENDOSCOPY;  Service: Endoscopy;  Laterality: N/A;   COLONOSCOPY WITH PROPOFOL N/A 04/18/2021   Procedure: COLONOSCOPY WITH PROPOFOL;  Surgeon: Wyline Mood, MD;  Location: [facility] ENDOSCOPY;  Service: Gastroenterology;  Laterality: N/A;   ESOPHAGOGASTRODUODENOSCOPY (EGD) WITH PROPOFOL N/A 01/13/2018   Procedure: ESOPHAGOGASTRODUODENOSCOPY (EGD) WITH PROPOFOL;  Surgeon: Wyline Mood, MD;  Location: [facility] ENDOSCOPY;  Service: Gastroenterology;  Laterality: N/A;   Partial kidney resection Left 1996   Prostate seed implantation  2015   TOTAL KNEE ARTHROPLASTY Left 02/19/2017   Procedure: TOTAL KNEE ARTHROPLASTY;  Surgeon: Kennedy Bucker, MD;  Location: [facility] ORS;  Service: Orthopedics;  Laterality: Left;   TOTAL KNEE ARTHROPLASTY Left 04/07/2017   Procedure: MEDIAL RETINACULAR REPAIR;  Surgeon: Kennedy Bucker, MD;  Location: [facility] ORS;  Service: Orthopedics;  Laterality: Left;    Prior to Admission medications   Medication Sig Start Date End Date Taking? Authorizing Provider  acetaminophen (TYLENOL)  500 MG tablet Take 500 mg by mouth 2 (two) times daily.   Yes [provider]  amLODipine (NORVASC) 10 MG tablet Take 10 mg by mouth at bedtime.    Yes [provider]  Ascorbic Acid (VITAMIN C PO) Take by mouth daily.   Yes [provider]  atorvastatin (LIPITOR) 40 MG tablet Take 40 mg by mouth daily. 09/02/21  Yes [provider]  cyanocobalamin (VITAMIN B12) 1000 MCG tablet Take 1 tablet by mouth daily.   Yes [provider]  docusate sodium (COLACE) 100 MG capsule Take 200 mg by mouth daily.   Yes [provider]  escitalopram (LEXAPRO) 10 MG tablet Take 10 mg by mouth daily.   Yes [provider]  fluticasone (FLONASE) 50 MCG/ACT nasal spray Place 2 sprays into the nose daily. 05/15/15  Yes [provider]  furosemide (LASIX) 20 MG tablet Take 20 mg by mouth 2 (two) times daily as needed for fluid.   Yes [provider]  hydrOXYzine (ATARAX) 50 MG tablet Take 50 mg by mouth 3 (three) times daily as needed.   Yes [provider]  JARDIANCE 10 MG TABS tablet Take 10 mg by mouth daily. 08/15/21  Yes [provider]  meclizine (ANTIVERT) 25 MG tablet Take 1 tablet (25 mg total) by mouth 2 (two) times daily as needed for dizziness. 02/01/15  Yes Enedina Finner, MD  mirtazapine (REMERON) 15 MG tablet Take 1 tablet by mouth  daily. 06/26/21  Yes [provider]  nitroGLYCERIN (NITROSTAT) 0.4 MG SL tablet Place 0.4 mg under the tongue every 5 (five) minutes as needed for chest pain.   Yes [provider]  Omega-3 Fatty Acids (OMEGA-3 FISH OIL) 500 MG CAPS Take 1 tablet by mouth 1 day or 1 dose.   Yes [provider]  omeprazole (PRILOSEC) 20 MG capsule Take 20 mg by mouth daily.   Yes [provider]  polyethylene glycol (MIRALAX / GLYCOLAX) packet Take 17 g by mouth daily as needed for mild constipation.    Yes [provider]  tamsulosin (FLOMAX) 0.4 MG CAPS capsule  Take 0.4 mg by mouth daily.   Yes [provider]  linaclotide (LINZESS) 72 MCG capsule Take 1 capsule (72 mcg total) by mouth daily before breakfast. Patient not taking: Reported on 06/17/2022 11/01/21   Wyline Mood, MD    Allergies as of 06/16/2022   (No Known Allergies)    Family History  Problem Relation Age of Onset   Colon cancer Mother    Lung cancer Father    Benign prostatic hyperplasia Father     Social History   Socioeconomic History   Marital status: Married    Spouse name: Not on file   Number of children: Not on file   Years of education: Not on file   Highest education level: Not on file  Occupational History   Not on file  Tobacco Use   Smoking status: Never   Smokeless tobacco: Never  Vaping Use   Vaping Use: Never used  Substance and Sexual Activity   Alcohol use: Yes    Comment: rarely   Drug use: No   Sexual activity: Yes    Birth control/protection: None  Other Topics Concern   Not on file  Social History Narrative   Not on file   Social Determinants of Health   Financial Resource Strain: Not on file  Food Insecurity: Not on file  Transportation Needs: Not on file  Physical Activity: Not on file  Stress: Not on file  Social Connections: Not on file  Intimate Partner Violence: Not on file    Review of Systems: See HPI, otherwise negative ROS  Physical Exam: Ht 5\' 9"  (1.753 m)   Wt (!) 149.7 kg   BMI 48.73 kg/m  General:   Alert,  pleasant and cooperative in NAD Head:  Normocephalic and atraumatic. Lungs:  Clear to auscultation.    Heart:  Regular rate and rhythm.   Impression/Plan: Colin Budge Sr. is here for ophthalmic surgery.  Risks, benefits, limitations, and alternatives regarding ophthalmic surgery have been reviewed with the patient.  Questions have been answered.  All parties agreeable.   Lockie Mola, MD  06/25/2022, 8:44 AM

## 2022-06-25 NOTE — Op Note (Signed)
PREOPERATIVE DIAGNOSIS:  Nuclear sclerotic cataract  right eye. 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 Item Serial No. Manufacturer Lot No. LRB No. Used Action  LENS IOL TECNIS EYHANCE 18.0 - [id number] Intraocular Lens LENS IOL TECNIS EYHANCE 18.0 [id number] [facility]  Right 1 Wasted  LENS IOL TECNIS EYHANCE 18.0 - [id number] Intraocular Lens LENS IOL TECNIS EYHANCE 18.0 [id number] [employer]  Right 1 Implanted    SURGEON:  Deirdre Evener, MD   ANESTHESIA:  Topical with tetracaine drops augmented with 1% preservative-free intracameral lidocaine.    COMPLICATIONS:  None.   DESCRIPTION OF PROCEDURE:  The patient was identified in the holding room and transported to the operating room and placed in the supine position under the operating microscope.  The right eye was identified as the operative eye and it was prepped and draped in the usual sterile ophthalmic fashion.   A 1 millimeter clear-corneal paracentesis was made at the 12:00 position.  0.5 ml of preservative-free 1% lidocaine was injected into the anterior chamber.  The anterior chamber was filled with Viscoat viscoelastic.  A 2.4 millimeter keratome was used to make a near-clear corneal incision at the 9:00 position. The microscope was adjusted and a gonioprism was used to visulaize the trabecular meshwork.  [facility] Dual Blade was advanced across the anterior chamber under viscoelastic.  The blade was used to mark the  trabecular meshwork at the 1:30 position.  The blade was placed two clock hours clockwise into the meshwork.  Proper postioning was confirmed.  The blade ws passed counterclockwise through the meshwork to excise approximately two to three clock-hours of trabecular meshwork.   A curvilinear capsulorrhexis was made with a cystotome and capsulorrhexis forceps.  Balanced salt solution was used to hydrodissect and hydrodelineate the nucleus.   Phacoemulsification was then used in stop and chop fashion to remove the lens nucleus and epinucleus.  The remaining cortex was then removed using the irrigation and aspiration handpiece. Provisc was then placed into the capsular bag to distend it for lens placement.  A lens was then injected into the capsular bag.  The remaining viscoelastic was aspirated.   Wounds were hydrated with balanced salt solution.  The anterior chamber was inflated to a physiologic pressure with balanced salt solution.  No wound leaks were noted. Cefuroxime 0.1 ml of a 10mg /ml solution was injected into the anterior chamber for a dose of 1 mg of intracameral antibiotic at the completion of the case.  The patient was taken to the recovery room in stable condition without complications of anesthesia or surgery.

## 2022-06-26 ENCOUNTER — Encounter: Payer: Self-pay | Admitting: Ophthalmology

## 2022-06-26 MED ORDER — NA CHONDROIT SULF-NA HYALURON 40-30 MG/ML IO SOSY
INTRAOCULAR | Status: DC | PRN
  Administered 2022-06-25: .5 mL via INTRAOCULAR

## 2023-12-01 ENCOUNTER — Other Ambulatory Visit: Payer: Self-pay | Admitting: Surgery

## 2023-12-09 ENCOUNTER — Other Ambulatory Visit: Payer: Self-pay

## 2023-12-09 ENCOUNTER — Encounter
Admission: RE | Admit: 2023-12-09 | Discharge: 2023-12-09 | Disposition: A | Discharge status: Home / Self Care | Source: Ambulatory Visit | Attending: Surgery | Admitting: Surgery

## 2023-12-09 VITALS — BP 115/68 | HR 56 | Temp 98.2°F | Resp 18 | Ht 69.0 in | Wt 333.0 lb

## 2023-12-09 DIAGNOSIS — E119 Type 2 diabetes mellitus without complications: Secondary | ICD-10-CM | POA: Diagnosis not present

## 2023-12-09 DIAGNOSIS — Z01810 Encounter for preprocedural cardiovascular examination: Secondary | ICD-10-CM | POA: Diagnosis not present

## 2023-12-09 DIAGNOSIS — I440 Atrioventricular block, first degree: Secondary | ICD-10-CM | POA: Diagnosis not present

## 2023-12-09 DIAGNOSIS — Z01818 Encounter for other preprocedural examination: Secondary | ICD-10-CM | POA: Diagnosis present

## 2023-12-09 DIAGNOSIS — I498 Other specified cardiac arrhythmias: Secondary | ICD-10-CM | POA: Diagnosis not present

## 2023-12-09 DIAGNOSIS — Z01812 Encounter for preprocedural laboratory examination: Secondary | ICD-10-CM

## 2023-12-09 DIAGNOSIS — R001 Bradycardia, unspecified: Secondary | ICD-10-CM | POA: Diagnosis not present

## 2023-12-09 HISTORY — DX: Obstructive sleep apnea (adult) (pediatric): G47.33

## 2023-12-09 HISTORY — DX: Chronic diastolic (congestive) heart failure: I50.32

## 2023-12-09 HISTORY — DX: Presence of dental prosthetic device (complete) (partial): K08.109

## 2023-12-09 LAB — SURGICAL PCR SCREEN
MRSA, PCR: NEGATIVE
Staphylococcus aureus: NEGATIVE

## 2023-12-09 LAB — URINALYSIS, ROUTINE W REFLEX MICROSCOPIC
Bilirubin Urine: NEGATIVE
Glucose, UA: NEGATIVE mg/dL
Hgb urine dipstick: NEGATIVE
Ketones, ur: NEGATIVE mg/dL
Leukocytes,Ua: NEGATIVE
Nitrite: NEGATIVE
Protein, ur: NEGATIVE mg/dL
Specific Gravity, Urine: 1.02 (ref 1.005–1.030)
pH: 6 (ref 5.0–8.0)

## 2023-12-09 LAB — CBC WITH DIFFERENTIAL/PLATELET
Abs Immature Granulocytes: 0.03 K/uL (ref 0.00–0.07)
Basophils Absolute: 0 K/uL (ref 0.0–0.1)
Basophils Relative: 0 %
Eosinophils Absolute: 0 K/uL (ref 0.0–0.5)
Eosinophils Relative: 0 %
HCT: 35.4 % — ABNORMAL LOW (ref 39.0–52.0)
Hemoglobin: 11.8 g/dL — ABNORMAL LOW (ref 13.0–17.0)
Immature Granulocytes: 1 %
Lymphocytes Relative: 19 %
Lymphs Abs: 1 K/uL (ref 0.7–4.0)
MCH: 32.2 pg (ref 26.0–34.0)
MCHC: 33.3 g/dL (ref 30.0–36.0)
MCV: 96.7 fL (ref 80.0–100.0)
Monocytes Absolute: 0.5 K/uL (ref 0.1–1.0)
Monocytes Relative: 9 %
Neutro Abs: 3.7 K/uL (ref 1.7–7.7)
Neutrophils Relative %: 71 %
Platelets: 189 K/uL (ref 150–400)
RBC: 3.66 MIL/uL — ABNORMAL LOW (ref 4.22–5.81)
RDW: 14.2 % (ref 11.5–15.5)
WBC: 5.2 K/uL (ref 4.0–10.5)
nRBC: 0 % (ref 0.0–0.2)

## 2023-12-09 LAB — COMPREHENSIVE METABOLIC PANEL WITH GFR
ALT: 14 U/L (ref 0–44)
AST: 20 U/L (ref 15–41)
Albumin: 3.3 g/dL — ABNORMAL LOW (ref 3.5–5.0)
Alkaline Phosphatase: 73 U/L (ref 38–126)
Anion gap: 14 (ref 5–15)
BUN: 18 mg/dL (ref 8–23)
CO2: 26 mmol/L (ref 22–32)
Calcium: 9 mg/dL (ref 8.9–10.3)
Chloride: 100 mmol/L (ref 98–111)
Creatinine, Ser: 1.45 mg/dL — ABNORMAL HIGH (ref 0.61–1.24)
GFR, Estimated: 50 mL/min — ABNORMAL LOW (ref >60)
Glucose, Bld: 123 mg/dL — ABNORMAL HIGH (ref 70–99)
Potassium: 3.7 mmol/L (ref 3.5–5.1)
Sodium: 140 mmol/L (ref 135–145)
Total Bilirubin: 0.3 mg/dL (ref 0.0–1.2)
Total Protein: 7.8 g/dL (ref 6.5–8.1)

## 2023-12-09 NOTE — Patient Instructions (Signed)
 Your procedure is scheduled nw:[id number] 12/17/23 Report to the Registration Desk on the 1st floor of the [facility]. To find out your arrival time, please call [phone] between 1PM - 3PM on: Wednesday 12/16/23 If your arrival time is 6:00 am, do not arrive before that time as the [facility] entrance doors do not open until 6:00 am.  REMEMBER: Instructions that are not followed completely may result in serious medical risk, up to and including death; or upon the discretion of your surgeon and anesthesiologist your surgery may need to be rescheduled.  Do not eat food after midnight the night before surgery.  No gum chewing or hard candies.  You may however, drink CLEAR liquids up to 2 hours before you are scheduled to arrive for your surgery. Do not drink anything within 2 hours of your scheduled arrival time.  Clear liquids include: - water  - apple juice without pulp - gatorade (not RED colors) - black coffee or tea (Do NOT add milk or creamers to the coffee or tea) Do NOT drink anything that is not on this list.   In addition, your doctor has ordered for you to drink the provided:  Ensure Pre-Surgery Clear Carbohydrate Drink  Drinking this carbohydrate drink up to two hours before surgery helps to reduce insulin resistance and improve patient outcomes. Please complete drinking 2 hours before scheduled arrival time.  One week prior to surgery: Stop Anti-inflammatories (NSAIDS) such as Advil, Aleve, Ibuprofen, Motrin, Naproxen, Naprosyn and Aspirin  based products such as Excedrin, Goody's Powder, BC Powder. Stop ANY OVER THE COUNTER supplements until after surgery. omega-3 fish oil (MAXEPA) 1000 MG   You may however, continue to take Tylenol  if needed for pain up until the day of surgery.   Continue taking all of your other prescription medications up until the day of surgery.  ON THE DAY OF SURGERY ONLY TAKE THESE MEDICATIONS WITH SIPS OF WATER:  buPROPion (WELLBUTRIN  XL) 150 MG  gabapentin (NEURONTIN) 100 MG  omeprazole (PRILOSEC) 40 MG  tamsulosin  (FLOMAX ) 0.4 MG   Use inhalers on the day of surgery and bring to the hospital.  No Alcohol for 24 hours before or after surgery.  No Smoking including e-cigarettes for 24 hours before surgery.  No chewable tobacco products for at least 6 hours before surgery.  No nicotine patches on the day of surgery.  Do not use any recreational drugs for at least a week (preferably 2 weeks) before your surgery.  Please be advised that the combination of cocaine and anesthesia may have negative outcomes, up to and including death. If you test positive for cocaine, your surgery will be cancelled.  On the morning of surgery brush your teeth with toothpaste and water, you may rinse your mouth with mouthwash if you wish. Do not swallow any toothpaste or mouthwash.  Use CHG Soap or wipes as directed on instruction sheet.  Do not wear jewelry, make-up, hairpins, clips or nail polish.  For welded (permanent) jewelry: bracelets, anklets, waist bands, etc.  Please have this removed prior to surgery.  If it is not removed, there is a chance that hospital personnel will need to cut it off on the day of surgery.  Do not wear lotions, powders, or perfumes.   Do not shave body hair from the neck down 48 hours before surgery.  Contact lenses, hearing aids and dentures may not be worn into surgery.  Do not bring valuables to the hospital. [facility] is not responsible for  any missing/lost belongings or valuables.   Bring your C-PAP to the hospital in case you may have to spend the night.   Notify your doctor if there is any change in your medical condition (cold, fever, infection).  Wear comfortable clothing (specific to your surgery type) to the hospital.  After surgery, you can help prevent lung complications by doing breathing exercises.  Take deep breaths and cough every 1-2 hours. Your doctor may order a device  called an Incentive Spirometer to help you take deep breaths. When coughing or sneezing, hold a pillow firmly against your incision with both hands. This is called "splinting." Doing this helps protect your incision. It also decreases belly discomfort.  If you are being admitted to the hospital overnight, leave your suitcase in the car. After surgery it may be brought to your room.  In case of increased patient census, it may be necessary for you, the patient, to continue your postoperative care in the Same Day Surgery department.  If you are being discharged the day of surgery, you will not be allowed to drive home. You will need a responsible individual to drive you home and stay with you for 24 hours after surgery.   If you are taking public transportation, you will need to have a responsible individual with you.  Please call the Pre-admissions Testing Dept. at [phone] if you have any questions about these instructions.  Surgery Visitation Policy:  Patients having surgery or a procedure may have two visitors.  Children under the age of 44 must have an adult with them who is not the patient.  Inpatient Visitation:    Visiting hours are 7 a.m. to 8 p.m. Up to four visitors are allowed at one time in a patient room. The visitors may rotate out with other people during the day.  One visitor age 63 or older may stay with the patient overnight and must be in the room by 8 p.m.   Merchandiser, retail to address health-related social needs:  [email or web address]  Pre-operative 4 CHG Bath Instructions   You can play a key role in reducing the risk of infection after surgery. Your skin needs to be as free of germs as possible. You can reduce the number of germs on your skin by washing with CHG (chlorhexidine  gluconate) soap before surgery. CHG is an antiseptic soap that kills germs and continues to kill germs even after washing.   DO NOT use if you have an allergy  to chlorhexidine /CHG or antibacterial soaps. If your skin becomes reddened or irritated, stop using the CHG and notify one of our RNs at [phone].   Please shower with the CHG soap starting 4 days before surgery using the following schedule:     Please keep in mind the following:  DO NOT shave, including legs and underarms, starting the day of your first shower.   You may shave your face at any point before/day of surgery.  Place clean sheets on your bed the day you start using CHG soap. Use a clean washcloth (not used since being washed) for each shower. DO NOT sleep with pets once you start using the CHG.   CHG Shower Instructions:  If you choose to wash your hair and private area, wash first with your normal shampoo/soap.  After you use shampoo/soap, rinse your hair and body thoroughly to remove shampoo/soap residue.  Turn the water OFF and apply about 3 tablespoons (45 ml) of CHG soap to a CLEAN washcloth.  Apply CHG soap ONLY FROM YOUR NECK DOWN TO YOUR TOES (washing for 3-5 minutes)  DO NOT use CHG soap on face, private areas, open wounds, or sores.  Pay special attention to the area where your surgery is being performed.  If you are having back surgery, having someone wash your back for you may be helpful. Wait 2 minutes after CHG soap is applied, then you may rinse off the CHG soap.  Pat dry with a clean towel  Put on clean clothes/pajamas   If you choose to wear lotion, please use ONLY the CHG-compatible lotions on the back of this paper.     Additional instructions for the day of surgery: DO NOT APPLY any lotions, deodorants, cologne, or perfumes.   Put on clean/comfortable clothes.  Brush your teeth.  Ask your nurse before applying any prescription medications to the skin.      CHG Compatible Lotions   Aveeno Moisturizing lotion  Cetaphil Moisturizing Cream  Cetaphil Moisturizing Lotion  Clairol Herbal Essence Moisturizing Lotion, Dry Skin  Clairol Herbal  Essence Moisturizing Lotion, Extra Dry Skin  Clairol Herbal Essence Moisturizing Lotion, Normal Skin  Curel Age Defying Therapeutic Moisturizing Lotion with Alpha Hydroxy  Curel Extreme Care Body Lotion  Curel Soothing Hands Moisturizing Hand Lotion  Curel Therapeutic Moisturizing Cream, Fragrance-Free  Curel Therapeutic Moisturizing Lotion, Fragrance-Free  Curel Therapeutic Moisturizing Lotion, Original Formula  Eucerin Daily Replenishing Lotion  Eucerin Dry Skin Therapy Plus Alpha Hydroxy Crme  Eucerin Dry Skin Therapy Plus Alpha Hydroxy Lotion  Eucerin Original Crme  Eucerin Original Lotion  Eucerin Plus Crme Eucerin Plus Lotion  Eucerin TriLipid Replenishing Lotion  Keri Anti-Bacterial Hand Lotion  Keri Deep Conditioning Original Lotion Dry Skin Formula Softly Scented  Keri Deep Conditioning Original Lotion, Fragrance Free Sensitive Skin Formula  Keri Lotion Fast Absorbing Fragrance Free Sensitive Skin Formula  Keri Lotion Fast Absorbing Softly Scented Dry Skin Formula  Keri Original Lotion  Keri Skin Renewal Lotion Keri Silky Smooth Lotion  Keri Silky Smooth Sensitive Skin Lotion  Nivea Body Creamy Conditioning Oil  Nivea Body Extra Enriched Lotion  Nivea Body Original Lotion  Nivea Body Sheer Moisturizing Lotion Nivea Crme  Nivea Skin Firming Lotion  NutraDerm 30 Skin Lotion  NutraDerm Skin Lotion  NutraDerm Therapeutic Skin Cream  NutraDerm Therapeutic Skin Lotion  ProShield Protective Hand Cream  Provon moisturizing lotion  How to Use an Incentive Spirometer  An incentive spirometer is a tool that measures how well you are filling your lungs with each breath. Learning to take long, deep breaths using this tool can help you keep your lungs clear and active. This may help to reverse or lessen your chance of developing breathing (pulmonary) problems, especially infection. You may be asked to use a spirometer: After a surgery. If you have a lung problem or a history of  smoking. After a long period of time when you have been unable to move or be active. If the spirometer includes an indicator to show the highest number that you have reached, your health care provider or respiratory therapist will help you set a goal. Keep a log of your progress as told by your health care provider. What are the risks? Breathing too quickly may cause dizziness or cause you to pass out. Take your time so you do not get dizzy or light-headed. If you are in pain, you may need to take pain medicine before doing incentive spirometry. It is harder to take a deep breath if you are  having pain. How to use your incentive spirometer  Sit up on the edge of your bed or on a chair. Hold the incentive spirometer so that it is in an upright position. Before you use the spirometer, breathe out normally. Place the mouthpiece in your mouth. Make sure your lips are closed tightly around it. Breathe in slowly and as deeply as you can through your mouth, causing the piston or the ball to rise toward the top of the chamber. Hold your breath for 3-5 seconds, or for as long as possible. If the spirometer includes a coach indicator, use this to guide you in breathing. Slow down your breathing if the indicator goes above the marked areas. Remove the mouthpiece from your mouth and breathe out normally. The piston or ball will return to the bottom of the chamber. Rest for a few seconds, then repeat the steps 10 or more times. Take your time and take a few normal breaths between deep breaths so that you do not get dizzy or light-headed. Do this every 1-2 hours when you are awake. If the spirometer includes a goal marker to show the highest number you have reached (best effort), use this as a goal to work toward during each repetition. After each set of 10 deep breaths, cough a few times. This will help to make sure that your lungs are clear. If you have an incision on your chest or abdomen from surgery,  place a pillow or a rolled-up towel firmly against the incision when you cough. This can help to reduce pain while taking deep breaths and coughing. General tips When you are able to get out of bed: Walk around often. Continue to take deep breaths and cough in order to clear your lungs. Keep using the incentive spirometer until your health care provider says it is okay to stop using it. If you have been in the hospital, you may be told to keep using the spirometer at home. Contact a health care provider if: You are having difficulty using the spirometer. You have trouble using the spirometer as often as instructed. Your pain medicine is not giving enough relief for you to use the spirometer as told. You have a fever. Get help right away if: You develop shortness of breath. You develop a cough with bloody mucus from the lungs. You have fluid or blood coming from an incision site after you cough. Summary An incentive spirometer is a tool that can help you learn to take long, deep breaths to keep your lungs clear and active. You may be asked to use a spirometer after a surgery, if you have a lung problem or a history of smoking, or if you have been inactive for a long period of time. Use your incentive spirometer as instructed every 1-2 hours while you are awake. If you have an incision on your chest or abdomen, place a pillow or a rolled-up towel firmly against your incision when you cough. This will help to reduce pain. Get help right away if you have shortness of breath, you cough up bloody mucus, or blood comes from your incision when you cough. This information is not intended to replace advice given to you by your health care provider. Make sure you discuss any questions you have with your health care provider.    Please go to the following website to access important education materials concerning your upcoming joint replacement.  [email or web address]

## 2023-12-17 ENCOUNTER — Encounter
Admission: RE | Disposition: A | Discharge status: Home Health | Payer: Self-pay | Source: Ambulatory Visit | Attending: Surgery

## 2023-12-17 ENCOUNTER — Telehealth (HOSPITAL_COMMUNITY): Payer: Self-pay | Admitting: Pharmacy Technician

## 2023-12-17 ENCOUNTER — Observation Stay
Admission: RE | Admit: 2023-12-17 | Discharge: 2023-12-19 | Disposition: A | Discharge status: Home Health | Source: Ambulatory Visit | Attending: Surgery | Admitting: Surgery

## 2023-12-17 ENCOUNTER — Encounter: Payer: Self-pay | Admitting: Surgery

## 2023-12-17 ENCOUNTER — Other Ambulatory Visit: Payer: Self-pay

## 2023-12-17 ENCOUNTER — Other Ambulatory Visit (HOSPITAL_COMMUNITY): Payer: Self-pay

## 2023-12-17 ENCOUNTER — Ambulatory Visit: Payer: Self-pay | Admitting: Urgent Care

## 2023-12-17 ENCOUNTER — Ambulatory Visit

## 2023-12-17 DIAGNOSIS — Z7901 Long term (current) use of anticoagulants: Secondary | ICD-10-CM | POA: Diagnosis not present

## 2023-12-17 DIAGNOSIS — I110 Hypertensive heart disease with heart failure: Secondary | ICD-10-CM | POA: Insufficient documentation

## 2023-12-17 DIAGNOSIS — Z79899 Other long term (current) drug therapy: Secondary | ICD-10-CM | POA: Insufficient documentation

## 2023-12-17 DIAGNOSIS — Z96652 Presence of left artificial knee joint: Secondary | ICD-10-CM | POA: Insufficient documentation

## 2023-12-17 DIAGNOSIS — E66813 Obesity, class 3: Secondary | ICD-10-CM | POA: Diagnosis not present

## 2023-12-17 DIAGNOSIS — I5030 Unspecified diastolic (congestive) heart failure: Secondary | ICD-10-CM | POA: Diagnosis not present

## 2023-12-17 DIAGNOSIS — Z6841 Body Mass Index (BMI) 40.0 and over, adult: Secondary | ICD-10-CM | POA: Insufficient documentation

## 2023-12-17 DIAGNOSIS — M1711 Unilateral primary osteoarthritis, right knee: Secondary | ICD-10-CM | POA: Diagnosis not present

## 2023-12-17 DIAGNOSIS — Z85828 Personal history of other malignant neoplasm of skin: Secondary | ICD-10-CM | POA: Diagnosis not present

## 2023-12-17 DIAGNOSIS — E119 Type 2 diabetes mellitus without complications: Secondary | ICD-10-CM | POA: Insufficient documentation

## 2023-12-17 DIAGNOSIS — Z8546 Personal history of malignant neoplasm of prostate: Secondary | ICD-10-CM | POA: Diagnosis not present

## 2023-12-17 DIAGNOSIS — Z96651 Presence of right artificial knee joint: Principal | ICD-10-CM

## 2023-12-17 DIAGNOSIS — M25561 Pain in right knee: Secondary | ICD-10-CM | POA: Diagnosis present

## 2023-12-17 HISTORY — PX: TOTAL KNEE ARTHROPLASTY: SHX125

## 2023-12-17 LAB — GLUCOSE, CAPILLARY
Glucose-Capillary: 145 mg/dL — ABNORMAL HIGH (ref 70–99)
Glucose-Capillary: 121 mg/dL — ABNORMAL HIGH (ref 70–99)
Glucose-Capillary: 162 mg/dL — ABNORMAL HIGH (ref 70–99)

## 2023-12-17 SURGERY — ARTHROPLASTY, KNEE, TOTAL
Anesthesia: Spinal | Site: Knee | Laterality: Right

## 2023-12-17 MED ORDER — DEXAMETHASONE SOD PHOSPHATE PF 10 MG/ML IJ SOLN
INTRAMUSCULAR | Status: DC | PRN
  Administered 2023-12-17: 4 mg via INTRAVENOUS

## 2023-12-17 MED ORDER — APIXABAN 2.5 MG PO TABS
2.5000 mg | ORAL_TABLET | Freq: Two times a day (BID) | ORAL | Status: DC
  Administered 2023-12-18 – 2023-12-19 (×3): 2.5 mg via ORAL
  Filled 2023-12-17 (×3): qty 1

## 2023-12-17 MED ORDER — TAMSULOSIN HCL 0.4 MG PO CAPS
0.4000 mg | ORAL_CAPSULE | Freq: Every day | ORAL | Status: DC
  Administered 2023-12-18 – 2023-12-19 (×2): 0.4 mg via ORAL
  Filled 2023-12-17 (×2): qty 1

## 2023-12-17 MED ORDER — DEXMEDETOMIDINE HCL IN NACL 80 MCG/20ML IV SOLN
INTRAVENOUS | Status: DC | PRN
  Administered 2023-12-17: 4 ug via INTRAVENOUS

## 2023-12-17 MED ORDER — CEFAZOLIN SODIUM-DEXTROSE 3-4 GM/150ML-% IV SOLN
3.0000 g | Freq: Four times a day (QID) | INTRAVENOUS | Status: AC
  Administered 2023-12-17 (×2): 3 g via INTRAVENOUS
  Filled 2023-12-17 (×3): qty 150

## 2023-12-17 MED ORDER — LOSARTAN POTASSIUM 25 MG PO TABS
25.0000 mg | ORAL_TABLET | Freq: Every day | ORAL | Status: DC
Start: 2023-12-18 — End: 2023-12-19
  Administered 2023-12-18 – 2023-12-19 (×2): 25 mg via ORAL
  Filled 2023-12-17 (×2): qty 1

## 2023-12-17 MED ORDER — BUPIVACAINE LIPOSOME 1.3 % IJ SUSP
INTRAMUSCULAR | Status: AC
Start: 2023-12-17 — End: 2023-12-17
  Filled 2023-12-17: qty 20

## 2023-12-17 MED ORDER — CEFAZOLIN SODIUM-DEXTROSE 2-4 GM/100ML-% IV SOLN
2.0000 g | INTRAVENOUS | Status: AC
  Administered 2023-12-17: 2 g via INTRAVENOUS

## 2023-12-17 MED ORDER — PROPOFOL 10 MG/ML IV BOLUS
INTRAVENOUS | Status: DC | PRN
  Administered 2023-12-17: 50 mg via INTRAVENOUS

## 2023-12-17 MED ORDER — SODIUM CHLORIDE (PF) 0.9 % IJ SOLN
INTRAMUSCULAR | Status: AC
Start: 2023-12-17 — End: 2023-12-17
  Filled 2023-12-17: qty 50

## 2023-12-17 MED ORDER — ACETAMINOPHEN 500 MG PO TABS
1000.0000 mg | ORAL_TABLET | Freq: Four times a day (QID) | ORAL | Status: AC
  Administered 2023-12-17 – 2023-12-18 (×3): 1000 mg via ORAL
  Filled 2023-12-17 (×4): qty 2

## 2023-12-17 MED ORDER — ONDANSETRON HCL 4 MG PO TABS: 4.0000 mg | ORAL_TABLET | Freq: Four times a day (QID) | ORAL | Status: DC | PRN

## 2023-12-17 MED ORDER — KETAMINE HCL 50 MG/5ML IJ SOSY
PREFILLED_SYRINGE | INTRAMUSCULAR | Status: AC
  Filled 2023-12-17: qty 5

## 2023-12-17 MED ORDER — ONDANSETRON HCL 4 MG/2ML IJ SOLN
INTRAMUSCULAR | Status: DC | PRN
  Administered 2023-12-17: 4 mg via INTRAVENOUS

## 2023-12-17 MED ORDER — BUPIVACAINE-EPINEPHRINE (PF) 0.5% -1:200000 IJ SOLN
INTRAMUSCULAR | Status: DC | PRN
  Administered 2023-12-17: 30 mL

## 2023-12-17 MED ORDER — MIDAZOLAM HCL 2 MG/2ML IJ SOLN
INTRAMUSCULAR | Status: AC
  Filled 2023-12-17: qty 2

## 2023-12-17 MED ORDER — MAGNESIUM HYDROXIDE 400 MG/5ML PO SUSP
30.0000 mL | Freq: Every day | ORAL | Status: DC | PRN
  Administered 2023-12-19: 30 mL via ORAL
  Filled 2023-12-17: qty 30

## 2023-12-17 MED ORDER — CHLORHEXIDINE GLUCONATE 0.12 % MT SOLN
OROMUCOSAL | Status: AC
  Filled 2023-12-17: qty 15

## 2023-12-17 MED ORDER — PROPOFOL 1000 MG/100ML IV EMUL
INTRAVENOUS | Status: AC
  Filled 2023-12-17: qty 100

## 2023-12-17 MED ORDER — FLEET ENEMA RE ENEM: 1.0000 | ENEMA | Freq: Once | RECTAL | Status: DC | PRN

## 2023-12-17 MED ORDER — PROPOFOL 10 MG/ML IV BOLUS
INTRAVENOUS | Status: AC
  Filled 2023-12-17: qty 20

## 2023-12-17 MED ORDER — SODIUM CHLORIDE 0.9 % IV SOLN: INTRAVENOUS | Status: AC

## 2023-12-17 MED ORDER — KETOROLAC TROMETHAMINE 15 MG/ML IJ SOLN: 15.0000 mg | Freq: Once | INTRAMUSCULAR | Status: DC

## 2023-12-17 MED ORDER — DIPHENHYDRAMINE HCL 12.5 MG/5ML PO ELIX: 12.5000 mg | ORAL_SOLUTION | ORAL | Status: DC | PRN

## 2023-12-17 MED ORDER — PANTOPRAZOLE SODIUM 40 MG PO TBEC
80.0000 mg | DELAYED_RELEASE_TABLET | Freq: Every day | ORAL | Status: DC
  Administered 2023-12-18 – 2023-12-19 (×2): 80 mg via ORAL
  Filled 2023-12-17 (×2): qty 2

## 2023-12-17 MED ORDER — TRANEXAMIC ACID-NACL 1000-0.7 MG/100ML-% IV SOLN
1000.0000 mg | INTRAVENOUS | Status: AC
  Administered 2023-12-17 (×2): 1000 mg via INTRAVENOUS

## 2023-12-17 MED ORDER — OXYCODONE HCL 5 MG PO TABS
5.0000 mg | ORAL_TABLET | ORAL | Status: DC | PRN
  Administered 2023-12-18: 5 mg via ORAL
  Administered 2023-12-19 (×2): 10 mg via ORAL
  Filled 2023-12-17 (×2): qty 2
  Filled 2023-12-17: qty 1

## 2023-12-17 MED ORDER — TRANEXAMIC ACID-NACL 1000-0.7 MG/100ML-% IV SOLN
INTRAVENOUS | Status: AC
  Filled 2023-12-17: qty 100

## 2023-12-17 MED ORDER — METOCLOPRAMIDE HCL 5 MG/ML IJ SOLN: 5.0000 mg | Freq: Three times a day (TID) | INTRAMUSCULAR | Status: DC | PRN

## 2023-12-17 MED ORDER — SODIUM CHLORIDE 0.9 % IR SOLN
Status: DC | PRN
Start: 2023-12-17 — End: 2023-12-17
  Administered 2023-12-17: 3000 mL

## 2023-12-17 MED ORDER — SPIRONOLACTONE 25 MG PO TABS
25.0000 mg | ORAL_TABLET | Freq: Every day | ORAL | Status: DC
Start: 2023-12-18 — End: 2023-12-19
  Administered 2023-12-18 – 2023-12-19 (×2): 25 mg via ORAL
  Filled 2023-12-17 (×2): qty 1

## 2023-12-17 MED ORDER — ORAL CARE MOUTH RINSE: 15.0000 mL | Freq: Once | OROMUCOSAL | Status: AC

## 2023-12-17 MED ORDER — LATANOPROST 0.005 % OP SOLN
1.0000 [drp] | Freq: Every day | OPHTHALMIC | Status: DC
  Administered 2023-12-17 – 2023-12-18 (×2): 1 [drp] via OPHTHALMIC
  Filled 2023-12-17: qty 2.5

## 2023-12-17 MED ORDER — NITROGLYCERIN 0.4 MG SL SUBL: 0.4000 mg | SUBLINGUAL_TABLET | SUBLINGUAL | Status: DC | PRN

## 2023-12-17 MED ORDER — CHLORHEXIDINE GLUCONATE 0.12 % MT SOLN
15.0000 mL | Freq: Once | OROMUCOSAL | Status: AC
  Administered 2023-12-17: 15 mL via OROMUCOSAL

## 2023-12-17 MED ORDER — ACETAMINOPHEN 10 MG/ML IV SOLN
INTRAVENOUS | Status: DC | PRN
  Administered 2023-12-17: 1000 mg via INTRAVENOUS

## 2023-12-17 MED ORDER — LIDOCAINE HCL (PF) 2 % IJ SOLN
INTRAMUSCULAR | Status: AC
  Filled 2023-12-17: qty 5

## 2023-12-17 MED ORDER — DOCUSATE SODIUM 100 MG PO CAPS
100.0000 mg | ORAL_CAPSULE | Freq: Two times a day (BID) | ORAL | Status: DC
  Administered 2023-12-17 – 2023-12-19 (×4): 100 mg via ORAL
  Filled 2023-12-17 (×4): qty 1

## 2023-12-17 MED ORDER — GABAPENTIN 100 MG PO CAPS
100.0000 mg | ORAL_CAPSULE | Freq: Every day | ORAL | Status: DC
  Administered 2023-12-18 – 2023-12-19 (×2): 100 mg via ORAL
  Filled 2023-12-17 (×2): qty 1

## 2023-12-17 MED ORDER — TRIAMCINOLONE ACETONIDE 40 MG/ML IJ SUSP
INTRAMUSCULAR | Status: AC
Start: 2023-12-17 — End: 2023-12-17
  Filled 2023-12-17: qty 2

## 2023-12-17 MED ORDER — PHENYLEPHRINE HCL-NACL 20-0.9 MG/250ML-% IV SOLN
INTRAVENOUS | Status: AC
Start: 2023-12-17 — End: 2023-12-17
  Filled 2023-12-17: qty 250

## 2023-12-17 MED ORDER — GLYCOPYRROLATE 0.2 MG/ML IJ SOLN
INTRAMUSCULAR | Status: DC | PRN
  Administered 2023-12-17: .1 mg via INTRAVENOUS

## 2023-12-17 MED ORDER — BUPROPION HCL ER (XL) 150 MG PO TB24
150.0000 mg | ORAL_TABLET | Freq: Every day | ORAL | Status: DC
  Administered 2023-12-18 – 2023-12-19 (×2): 150 mg via ORAL
  Filled 2023-12-17 (×2): qty 1

## 2023-12-17 MED ORDER — DORZOLAMIDE HCL-TIMOLOL MAL 2-0.5 % OP SOLN
1.0000 [drp] | Freq: Two times a day (BID) | OPHTHALMIC | Status: DC
  Administered 2023-12-17 – 2023-12-19 (×4): 1 [drp] via OPHTHALMIC
  Filled 2023-12-17: qty 10

## 2023-12-17 MED ORDER — PROPOFOL 500 MG/50ML IV EMUL
INTRAVENOUS | Status: DC | PRN
  Administered 2023-12-17: 75 ug/kg/min via INTRAVENOUS

## 2023-12-17 MED ORDER — ONDANSETRON HCL 4 MG/2ML IJ SOLN: 4.0000 mg | Freq: Four times a day (QID) | INTRAMUSCULAR | Status: DC | PRN

## 2023-12-17 MED ORDER — LIDOCAINE HCL (CARDIAC) PF 100 MG/5ML IV SOSY
PREFILLED_SYRINGE | INTRAVENOUS | Status: DC | PRN
  Administered 2023-12-17: 50 mg via INTRAVENOUS

## 2023-12-17 MED ORDER — KETOROLAC TROMETHAMINE 30 MG/ML IJ SOLN
INTRAMUSCULAR | Status: DC | PRN
  Administered 2023-12-17: 30 mg via INTRAMUSCULAR

## 2023-12-17 MED ORDER — PROPOFOL 1000 MG/100ML IV EMUL
INTRAVENOUS | Status: AC
Start: 2023-12-17 — End: 2023-12-17
  Filled 2023-12-17: qty 100

## 2023-12-17 MED ORDER — MIDAZOLAM HCL 5 MG/5ML IJ SOLN
INTRAMUSCULAR | Status: DC | PRN
  Administered 2023-12-17: 2 mg via INTRAVENOUS

## 2023-12-17 MED ORDER — PHENYLEPHRINE HCL-NACL 20-0.9 MG/250ML-% IV SOLN
INTRAVENOUS | Status: DC | PRN
  Administered 2023-12-17: 20 ug/min via INTRAVENOUS

## 2023-12-17 MED ORDER — BISACODYL 10 MG RE SUPP
10.0000 mg | Freq: Every day | RECTAL | Status: DC | PRN
Start: 2023-12-17 — End: 2023-12-19

## 2023-12-17 MED ORDER — MECLIZINE HCL 25 MG PO TABS: 25.0000 mg | ORAL_TABLET | Freq: Two times a day (BID) | ORAL | Status: DC | PRN

## 2023-12-17 MED ORDER — BUPIVACAINE-EPINEPHRINE (PF) 0.5% -1:200000 IJ SOLN
INTRAMUSCULAR | Status: AC
Start: 2023-12-17 — End: 2023-12-17
  Filled 2023-12-17: qty 30

## 2023-12-17 MED ORDER — PROPOFOL 10 MG/ML IV BOLUS
INTRAVENOUS | Status: AC
Start: 2023-12-17 — End: 2023-12-17
  Filled 2023-12-17: qty 20

## 2023-12-17 MED ORDER — KETOROLAC TROMETHAMINE 15 MG/ML IJ SOLN
7.5000 mg | Freq: Four times a day (QID) | INTRAMUSCULAR | Status: AC
  Administered 2023-12-17 – 2023-12-18 (×4): 7.5 mg via INTRAVENOUS
  Filled 2023-12-17 (×4): qty 1

## 2023-12-17 MED ORDER — FENTANYL CITRATE (PF) 100 MCG/2ML IJ SOLN
INTRAMUSCULAR | Status: AC
  Filled 2023-12-17: qty 2

## 2023-12-17 MED ORDER — TRANEXAMIC ACID-NACL 1000-0.7 MG/100ML-% IV SOLN
INTRAVENOUS | Status: AC
Start: 2023-12-17 — End: 2023-12-17
  Filled 2023-12-17: qty 100

## 2023-12-17 MED ORDER — ACETAMINOPHEN 325 MG PO TABS: 325.0000 mg | ORAL_TABLET | Freq: Four times a day (QID) | ORAL | Status: DC | PRN

## 2023-12-17 MED ORDER — CEFAZOLIN SODIUM-DEXTROSE 2-4 GM/100ML-% IV SOLN
INTRAVENOUS | Status: AC
  Filled 2023-12-17: qty 100

## 2023-12-17 MED ORDER — ACETAMINOPHEN 10 MG/ML IV SOLN
INTRAVENOUS | Status: AC
  Filled 2023-12-17: qty 100

## 2023-12-17 MED ORDER — TRIAMCINOLONE ACETONIDE 40 MG/ML IJ SUSP
INTRAMUSCULAR | Status: DC | PRN
  Administered 2023-12-17: 80 mg via INTRAMUSCULAR

## 2023-12-17 MED ORDER — ATORVASTATIN CALCIUM 20 MG PO TABS
40.0000 mg | ORAL_TABLET | Freq: Every day | ORAL | Status: DC
  Administered 2023-12-17 – 2023-12-18 (×2): 40 mg via ORAL
  Filled 2023-12-17 (×2): qty 2

## 2023-12-17 MED ORDER — METOCLOPRAMIDE HCL 5 MG PO TABS: 5.0000 mg | ORAL_TABLET | Freq: Three times a day (TID) | ORAL | Status: DC | PRN

## 2023-12-17 MED ORDER — HYDROMORPHONE HCL 1 MG/ML IJ SOLN: 0.2500 mg | INTRAMUSCULAR | Status: DC | PRN

## 2023-12-17 MED ORDER — EPHEDRINE SULFATE-NACL 50-0.9 MG/10ML-% IV SOSY
PREFILLED_SYRINGE | INTRAVENOUS | Status: DC | PRN
  Administered 2023-12-17: 5 mg via INTRAVENOUS
  Administered 2023-12-17: 10 mg via INTRAVENOUS
  Administered 2023-12-17: 5 mg via INTRAVENOUS

## 2023-12-17 MED ORDER — FENTANYL CITRATE (PF) 100 MCG/2ML IJ SOLN: 25.0000 ug | INTRAMUSCULAR | Status: DC | PRN

## 2023-12-17 MED ORDER — TORSEMIDE 20 MG PO TABS
40.0000 mg | ORAL_TABLET | Freq: Two times a day (BID) | ORAL | Status: DC
Start: 2023-12-17 — End: 2023-12-19
  Administered 2023-12-17 – 2023-12-19 (×4): 40 mg via ORAL
  Filled 2023-12-17 (×4): qty 2

## 2023-12-17 MED ORDER — BUPIVACAINE HCL (PF) 0.5 % IJ SOLN
INTRAMUSCULAR | Status: DC | PRN
  Administered 2023-12-17: 3 mL

## 2023-12-17 MED ORDER — SODIUM CHLORIDE 0.9 % IV SOLN
INTRAVENOUS | Status: DC | PRN
  Administered 2023-12-17: 60 mL

## 2023-12-17 MED ORDER — EZETIMIBE 10 MG PO TABS
10.0000 mg | ORAL_TABLET | Freq: Every day | ORAL | Status: DC
  Administered 2023-12-17 – 2023-12-18 (×2): 10 mg via ORAL
  Filled 2023-12-17 (×2): qty 1

## 2023-12-17 MED ORDER — SODIUM CHLORIDE 0.9 % IV SOLN: INTRAVENOUS | Status: DC

## 2023-12-17 SURGICAL SUPPLY — 55 items
BLADE SAW 90X13X1.19 OSCILLAT (BLADE) ×1 IMPLANT
BLADE SAW SAG 25X90X1.19 (BLADE) ×1 IMPLANT
BLADE SURG SZ20 CARB STEEL (BLADE) ×1 IMPLANT
BNDG COMPR 6X5.8 VLCR NS LF (GAUZE/BANDAGES/DRESSINGS) ×1 IMPLANT
BNDG ELASTIC 6INX 5YD STR LF (GAUZE/BANDAGES/DRESSINGS) IMPLANT
CEMENT BONE R 1X40 (Cement) ×2 IMPLANT
CEMENT VACUUM MIXING SYSTEM (MISCELLANEOUS) ×1 IMPLANT
CHLORAPREP W/TINT 26 (MISCELLANEOUS) ×1 IMPLANT
COOLER ICEMAN CLASSIC (MISCELLANEOUS) ×1 IMPLANT
COVER MAYO STAND STRL (DRAPES) ×1 IMPLANT
CUFF TRNQT CYL 24X4X16.5-23 (TOURNIQUET CUFF) IMPLANT
CUFF TRNQT CYL 34X4.125X (TOURNIQUET CUFF) IMPLANT
DRAPE IMP U-DRAPE 54X76 (DRAPES) ×1 IMPLANT
DRAPE SHEET LG 3/4 BI-LAMINATE (DRAPES) ×1 IMPLANT
DRAPE U-SHAPE 47X51 STRL (DRAPES) ×1 IMPLANT
DRSG MEPILEX SACRM 8.7X9.8 (GAUZE/BANDAGES/DRESSINGS) IMPLANT
DRSG OPSITE POSTOP 4X10 (GAUZE/BANDAGES/DRESSINGS) ×1 IMPLANT
DRSG OPSITE POSTOP 4X8 (GAUZE/BANDAGES/DRESSINGS) ×1 IMPLANT
ELECT CAUTERY BLADE 6.4 (BLADE) ×1 IMPLANT
ELECTRODE REM PT RTRN 9FT ADLT (ELECTROSURGICAL) ×1 IMPLANT
FEMUR CMTD CCR STD SZ7 R KNEE (Knees) IMPLANT
GAUZE XEROFORM 1X8 LF (GAUZE/BANDAGES/DRESSINGS) ×1 IMPLANT
GLOVE BIO SURGEON STRL SZ7.5 (GLOVE) ×4 IMPLANT
GLOVE BIO SURGEON STRL SZ8 (GLOVE) ×4 IMPLANT
GLOVE BIOGEL PI IND STRL 8 (GLOVE) ×1 IMPLANT
GLOVE INDICATOR 8.0 STRL GRN (GLOVE) ×1 IMPLANT
GOWN STRL REUS W/ TWL LRG LVL3 (GOWN DISPOSABLE) ×1 IMPLANT
GOWN STRL REUS W/ TWL XL LVL3 (GOWN DISPOSABLE) ×1 IMPLANT
HOOD PEEL AWAY T7 (MISCELLANEOUS) ×3 IMPLANT
INSERT TIB FIXED RT 11 (Insert) IMPLANT
KIT TURNOVER KIT A (KITS) ×1 IMPLANT
MANIFOLD NEPTUNE II (INSTRUMENTS) ×1 IMPLANT
NDL SPNL 20GX3.5 QUINCKE YW (NEEDLE) ×1 IMPLANT
NEEDLE SPNL 20GX3.5 QUINCKE YW (NEEDLE) ×1 IMPLANT
NS IRRIG 500ML POUR BTL (IV SOLUTION) ×1 IMPLANT
PACK TOTAL KNEE (MISCELLANEOUS) ×1 IMPLANT
PAD COLD UNI WRAP-ON (PAD) ×1 IMPLANT
PENCIL SMOKE EVACUATOR (MISCELLANEOUS) ×1 IMPLANT
PIN DRILL HDLS TROCAR 75 4PK (PIN) IMPLANT
SCREW FEMALE HEX FIX 25X2.5 (ORTHOPEDIC DISPOSABLE SUPPLIES) IMPLANT
SOL .9 NS 3000ML IRR UROMATIC (IV SOLUTION) ×1 IMPLANT
STAPLER SKIN PROX 35W (STAPLE) ×1 IMPLANT
STEM POLY PAT PLY 32M KNEE (Knees) IMPLANT
STEM TIB ST PERS 14+30 (Stem) IMPLANT
STEM TIBIA 5 DEG SZ E R KNEE (Knees) IMPLANT
STOCKINETTE IMPERV 14X48 (MISCELLANEOUS) ×1 IMPLANT
SUCTION TUBE FRAZIER 10FR DISP (SUCTIONS) ×1 IMPLANT
SUT VIC AB 0 CT1 36 (SUTURE) ×3 IMPLANT
SUT VIC AB 2-0 CT1 TAPERPNT 27 (SUTURE) ×3 IMPLANT
SYR 10ML LL (SYRINGE) ×1 IMPLANT
SYR 20ML LL LF (SYRINGE) ×1 IMPLANT
SYR 30ML LL (SYRINGE) IMPLANT
TIP FAN IRRIG PULSAVAC PLUS (DISPOSABLE) ×1 IMPLANT
TRAP FLUID SMOKE EVACUATOR (MISCELLANEOUS) ×1 IMPLANT
WATER STERILE IRR 500ML POUR (IV SOLUTION) ×1 IMPLANT

## 2023-12-17 NOTE — Evaluation (Signed)
 Physical Therapy Evaluation Patient Details Name: Colin SIMONES Sr. MRN: [id number] DOB: 1950-04-04 Today's Date: 12/17/2023  History of Present Illness  Colin Mccoy is a 73 y.o.male who is here for right total knee arthroplasty to be done by Dr. Edie.  Clinical Impression  Patient noted to be in supine position at PT arrival in room, for an initial PT evaluation due to a decline in functional status, with baseline mobility reported as modI with SPC, and currently requiring CGA for transfers. The patient is A&O x 4, presenting with open willingness and delightful to work with PT and goals of getting stronger, with discharge expectations that include whatever is recommended. The patient resides in a home and lives with family and has wife and children support. There are 2 to enter and inside the residence per chart review but pt. In room states that they now have no STE.  Vitals are stable with an SpO? of >95% on RM, and gait was assessed with RW, limited by expected PO limitations. Gait mechanic observations noted excessive trunk flexion, reduced R knee excursion and slight unsteadiness. Activity restrictions include walking, prolonged standing and restricted community ambulation, and the overall clinical impression is that the patient presents with mild mobility limitations secondary to R knee TKA. Recommended skilled PT will address safety, mobility, and discharge planning. PT recommendation to d/c patient to HHPT upon medical clearance.        If plan is discharge home, recommend the following: A little help with walking and/or transfers;A little help with bathing/dressing/bathroom   Can travel by private vehicle        Equipment Recommendations None recommended by PT  Recommendations for Other Services       Functional Status Assessment Patient has had a recent decline in their functional status and demonstrates the ability to make significant improvements in function in a reasonable and  predictable amount of time.     Precautions / Restrictions Precautions Precautions: Fall Recall of Precautions/Restrictions: Intact Restrictions Weight Bearing Restrictions Per Provider Order: Yes RLE Weight Bearing Per Provider Order: Weight bearing as tolerated      Mobility  Bed Mobility Overal bed mobility: Needs Assistance Bed Mobility: Rolling, Sidelying to Sit Rolling: Min assist, Contact guard assist Sidelying to sit: Contact guard assist, Min assist            Transfers Overall transfer level: Needs assistance Equipment used: Rolling walker (2 wheels) Transfers: Sit to/from Stand Sit to Stand: Min assist, Contact guard assist                Ambulation/Gait Ambulation/Gait assistance: Contact guard assist Gait Distance (Feet): 125 Feet Assistive device: Rolling walker (2 wheels) Gait Pattern/deviations: Step-to pattern, Trunk flexed, Decreased stride length          Stairs            Wheelchair Mobility     Tilt Bed    Modified Rankin (Stroke Patients Only)       Balance Overall balance assessment: Needs assistance Sitting-balance support: Feet supported, Bilateral upper extremity supported Sitting balance-Leahy Scale: Good     Standing balance support: During functional activity, Reliant on assistive device for balance Standing balance-Leahy Scale: Good                               Pertinent Vitals/Pain Pain Assessment Pain Assessment: Faces Faces Pain Scale: Hurts little more Pain Location: R knee Pain Descriptors /  Indicators: Throbbing, Aching Pain Intervention(s): Limited activity within patient's tolerance, Monitored during session, Repositioned    Home Living Family/patient expects to be discharged to:: Private residence Living Arrangements: Spouse/significant other Available Help at Discharge: Available 24 hours/day Type of Home: House Home Access: Stairs to enter Entrance Stairs-Rails:  None Entrance Stairs-Number of Steps: 2   Home Layout: One level Home Equipment: Agricultural Consultant (2 wheels);Shower seat Additional Comments: per chart history pt. has 2 seats to enter; pt states that they now have no steps to enter they reside in the same residence    Prior Function Prior Level of Function : Needs assist       Physical Assist : Mobility (physical) Mobility (physical): Gait   Mobility Comments: use SPC occassionaly due to progressive knee pain       Extremity/Trunk Assessment        Lower Extremity Assessment Lower Extremity Assessment: Generalized weakness    Cervical / Trunk Assessment Cervical / Trunk Assessment: Normal  Communication   Communication Communication: No apparent difficulties    Cognition Arousal: Alert Behavior During Therapy: WFL for tasks assessed/performed   PT - Cognitive impairments: No apparent impairments                         Following commands: Intact       Cueing Cueing Techniques: Verbal cues, Gestural cues, Tactile cues, Visual cues     General Comments      Exercises Total Joint Exercises Ankle Circles/Pumps: AROM, Right, 10 reps Quad Sets: AROM, Right, 10 reps Short Arc Quad: AROM, Right, 10 reps Heel Slides: AROM, Right, 10 reps Hip ABduction/ADduction: AROM, Right, 5 reps Straight Leg Raises: AROM, Right, 5 reps Long Arc Quad: AROM, Right, 10 reps Knee Flexion: AROM, Right, 10 reps Goniometric ROM: 110 Marching in Standing: AROM, 10 reps Other Exercises Other Exercises: pt educated on HEP, role of PT in acute care   Assessment/Plan    PT Assessment Patient needs continued PT services  PT Problem List Decreased strength;Decreased range of motion;Decreased activity tolerance;Decreased mobility;Pain       PT Treatment Interventions DME instruction;Gait training;Stair training;Functional mobility training;Therapeutic activities;Therapeutic exercise;Balance training;Neuromuscular  re-education;Patient/family education;Manual techniques    PT Goals (Current goals can be found in the Care Plan section)  Acute Rehab PT Goals Patient Stated Goal: Pt wants to get stronger PT Goal Formulation: With patient/family Time For Goal Achievement: 02/13/24 Potential to Achieve Goals: Good    Frequency BID     Co-evaluation               AM-PAC PT 6 Clicks Mobility  Outcome Measure Help needed turning from your back to your side while in a flat bed without using bedrails?: A Little Help needed moving from lying on your back to sitting on the side of a flat bed without using bedrails?: A Little Help needed moving to and from a bed to a chair (including a wheelchair)?: A Little Help needed standing up from a chair using your arms (e.g., wheelchair or bedside chair)?: A Little Help needed to walk in hospital room?: A Little Help needed climbing 3-5 steps with a railing? : A Little 6 Click Score: 18    End of Session Equipment Utilized During Treatment: Gait belt Activity Tolerance: Patient tolerated treatment well;No increased pain Patient left: in bed;with bed alarm set;with family/visitor present Nurse Communication: Mobility status PT Visit Diagnosis: Muscle weakness (generalized) (M62.81);Pain;Difficulty in walking, not elsewhere classified (R26.2) Pain -  Right/Left: Right Pain - part of body: Knee    Time: 8455-8384 PT Time Calculation (min) (ACUTE ONLY): 31 min   Charges:   PT Evaluation $PT Eval Low Complexity: 1 Low   PT General Charges $$ ACUTE PT VISIT: 1 Visit         Sherlean Lesches DPT, PT    Jamielyn Petrucci A Larayah Clute 12/17/2023, 4:31 PM

## 2023-12-17 NOTE — Anesthesia Preprocedure Evaluation (Signed)
 Anesthesia Evaluation  Patient identified by MRN, date of birth, ID band Patient awake    Reviewed: Allergy & Precautions, NPO status , Patient's Chart, lab work & pertinent test results  History of Anesthesia Complications Negative for: history of anesthetic complications  Airway Mallampati: II  TM Distance: >3 FB Neck ROM: Full    Dental  (+) Upper Dentures, Lower Dentures, Dental Advidsory Given   Pulmonary shortness of breath and with exertion, sleep apnea and Continuous Positive Airway Pressure Ventilation , neg COPD, neg recent URI   Pulmonary exam normal  + decreased breath sounds      Cardiovascular Exercise Tolerance: Good hypertension, Pt. on medications (-) angina (-) Past MI and (-) Cardiac Stents Normal cardiovascular exam(-) dysrhythmias (-) Valvular Problems/Murmurs Rhythm:Regular Rate:Normal     Neuro/Psych negative neurological ROS  negative psych ROS   GI/Hepatic negative GI ROS, Neg liver ROS,,,  Endo/Other  diabetes (borderline)  Class 3 obesity  Renal/GU negative Renal ROS  negative genitourinary   Musculoskeletal  (+) Arthritis ,    Abdominal  (+) + obese  Peds negative pediatric ROS (+)  Hematology negative hematology ROS (+)   Anesthesia Other Findings Past Medical History: No date: Arthritis No date: BPH (benign prostatic hyperplasia) 2015: Cancer (HCC)     Comment:  Prostate CA 1996: Cancer (HCC)     Comment:  left Kidney No date: GERD (gastroesophageal reflux disease) No date: High cholesterol No date: History of kidney stones     Comment:  left kidney several times No date: Hypertension No date: Obese No date: Pre-diabetes No date: Sleep apnea No date: Vitamin D deficiency  Past Surgical History: No date: COLONOSCOPY No date: COLONOSCOPY 09/09/2016: COLONOSCOPY WITH PROPOFOL ; N/A     Comment:  Procedure: COLONOSCOPY WITH PROPOFOL ;  Surgeon:               Gaylyn Gladis PENNER, MD;  Location: [facility] ENDOSCOPY;                Service: Endoscopy;  Laterality: N/A; 01/13/2018: ESOPHAGOGASTRODUODENOSCOPY (EGD) WITH PROPOFOL ; N/A     Comment:  Procedure: ESOPHAGOGASTRODUODENOSCOPY (EGD) WITH               PROPOFOL ;  Surgeon: Therisa Bi, MD;  Location: [facility]               ENDOSCOPY;  Service: Gastroenterology;  Laterality: N/A; 1996: Partial kidney resection 2015: Prostate seed implantation 02/19/2017: TOTAL KNEE ARTHROPLASTY; Left     Comment:  Procedure: TOTAL KNEE ARTHROPLASTY;  Surgeon: Kathlynn Sharper, MD;  Location: [facility] ORS;  Service: Orthopedics;               Laterality: Left; 04/07/2017: TOTAL KNEE ARTHROPLASTY; Left     Comment:  Procedure: MEDIAL RETINACULAR REPAIR;  Surgeon: Kathlynn Sharper, MD;  Location: [facility] ORS;  Service: Orthopedics;               Laterality: Left;     Reproductive/Obstetrics negative OB ROS                              Anesthesia Physical Anesthesia Plan  ASA: 3  Anesthesia Plan: Spinal   Post-op Pain Management:    Induction: Intravenous  PONV Risk Score and Plan: 1 and Propofol  infusion  and TIVA  Airway Management Planned: Natural Airway and Simple Face Mask  Additional Equipment:   Intra-op Plan:   Post-operative Plan:   Informed Consent: I have reviewed the patients History and Physical, chart, labs and discussed the procedure including the risks, benefits and alternatives for the proposed anesthesia with the patient or authorized representative who has indicated his/her understanding and acceptance.     Dental Advisory Given  Plan Discussed with: CRNA and Surgeon  Anesthesia Plan Comments:          Anesthesia Quick Evaluation

## 2023-12-17 NOTE — TOC CM/SW Note (Signed)
 Transition of Care (TOC) CM/SW Note    Transition of Care [facility]) - Inpatient Brief Assessment   Patient Details  Name: Colin RICKEY Sr. MRN: [id number] Date of Birth: 11/05/1950  Transition of Care [facility]) CM/SW Contact:    Alvaro Louder, LCSW Phone Number: 12/17/2023, 3:48 PM   Clinical Narrative:  TOC Consulted, Per chart review there are no TOC needs at this time. Please consult if needs arise.   TOC to follow for DC    Transition of Care Asessment: Insurance and Status: Insurance coverage has been reviewed Patient has primary care physician: Yes Home environment has been reviewed: Single Family Home Prior level of function:: Ox4 Prior/Current Home Services: No current home services Social Drivers of Health Review: SDOH reviewed no interventions necessary Readmission risk has been reviewed: Yes Transition of care needs: no transition of care needs at this time

## 2023-12-17 NOTE — Progress Notes (Signed)
 PT Cancellation Note  Patient Details Name: CLAIBORNE STROBLE Sr. MRN: [id number] DOB: Jan 09, 1951   Cancelled Treatment:    Reason Eval/Treat Not Completed: Other (comment) (Patient reports his leg is still numb and declined PT at this time. Will follow up as appropriate)  Randine Essex, PT, MPT  Randine LULLA Essex 12/17/2023, 3:14 PM

## 2023-12-17 NOTE — Anesthesia Procedure Notes (Signed)
 Spinal  Patient location during procedure: OR Start time: 12/17/2023 7:42 AM End time: 12/17/2023 7:47 AM Reason for block: surgical anesthesia Staffing Performed: anesthesiologist  Anesthesiologist: Dario Barter, MD Performed by: Myra Lawless, CRNA Authorized by: Dario Barter, MD   Preanesthetic Checklist Completed: patient identified, IV checked, site marked, risks and benefits discussed, surgical consent, monitors and equipment checked, pre-op evaluation and timeout performed Spinal Block Patient position: sitting Prep: DuraPrep Patient monitoring: heart rate, cardiac monitor, continuous pulse ox and blood pressure Approach: midline Location: L3-4 Injection technique: single-shot Needle Needle type: Sprotte  Needle gauge: 25 G Needle length: 15 cm Assessment Sensory level: T4 Events: CSF return Additional Notes IV functioning, monitors applied to pt. Expiration date of kit checked and confirmed to be in date. Sterile prep and drape, hand hygiene and sterile gloved used. Pt was positioned and spine was prepped in sterile fashion. Skin was anesthetized with lidocaine . Free flow of clear CSF obtained prior to injecting local anesthetic into CSF x 1 attempt. Spinal needle aspirated freely following injection. Needle was carefully withdrawn, and pt tolerated procedure well. Loss of motor and sensory on exam post injection.

## 2023-12-17 NOTE — Transfer of Care (Signed)
 Immediate Anesthesia Transfer of Care Note  Patient: Colin Mccoy.  Procedure(s) Performed: ARTHROPLASTY, KNEE, TOTAL (Right: Knee)  Patient Location: PACU  Anesthesia Type:General and Spinal  Level of Consciousness: awake, alert , oriented, and patient cooperative  Airway & Oxygen Therapy: Patient Spontanous Breathing and Patient connected to face mask oxygen  Post-op Assessment: Report given to RN, Post -op Vital signs reviewed and stable, and Patient moving all extremities X 4  Post vital signs: Reviewed and stable  Last Vitals:  Vitals Value Taken Time  BP 97/61 12/17/23 10:40  Temp 98.1 F 12/17/23 10:38  Pulse 58 12/17/23 10:38  Resp 16 12/17/23 10:38  SpO2 100 % 12/17/23 10:38  Vitals shown include unfiled device data.  Last Pain:  Vitals:   12/17/23 0627  TempSrc: Temporal  PainSc: 3          Complications: No notable events documented.

## 2023-12-17 NOTE — Telephone Encounter (Signed)
 Patient Product/process Development Scientist completed.    The patient is insured through [employer]. Patient has Medicare and is not eligible for a copay card, but may be able to apply for patient assistance or Medicare RX Payment Plan (Patient Must reach out to their plan, if eligible for payment plan), if available.    Ran test claim for Eliquis 2.5 mg and the current 30 day co-pay is $152.71.   This test claim was processed through [facility]- copay amounts may vary at other pharmacies due to pharmacy/plan contracts, or as the patient moves through the different stages of their insurance plan.     Reyes Sharps, CPHT Pharmacy Technician Patient Advocate Specialist Lead [facility] Patient Advocate Team Direct Number: [phone]  Fax: [phone]

## 2023-12-17 NOTE — Op Note (Signed)
 12/17/2023  10:33 AM  Patient:   Colin HURLBUT Sr.  Pre-Op Diagnosis:   Severe degenerative joint disease, right knee.  Post-Op Diagnosis:   Same  Procedure:   Right TKA using all-cemented Zimmer Persona PS system with a #7 PCR femur, a(n) E-sized tibial tray with an 11 mm posterior stabilized E-poly insert, and an 8.5 x 32 mm all-poly 3-pegged domed patella.  Surgeon:   DOROTHA Reyes Maltos, MD  Assistant:   Gustavo Level, PA-C; HILLARY Hummer, PA-S  Anesthesia:   Spinal  Findings:   As above  Complications:   None  EBL:   700 cc  Fluids:   10 cc crystalloid  UOP:   None  TT:   130 minutes at 325 mmHg  Drains:   None  Closure:   Staples  Implants:   As above  Brief Clinical Note:   The patient is a 73 year old morbidly obese male with a long history of progressively worsening right knee pain. The patient's symptoms have progressed despite medications, activity modification, injections, etc. The patient's history and examination were consistent with advanced degenerative joint disease of the right knee with medial subluxation of the femur on the tibia, all of which were confirmed by plain radiographs. The patient presents at this time for a right total knee arthroplasty.  Procedure:   The patient was brought into the operating room. After adequate spinal anesthesia was obtained, the patient was repositioned in the supine position on the operating room table. The right lower extremity was prepped with ChloraPrep solution and draped sterilely. Preoperative antibiotics were administered. A timeout was performed to verify the appropriate surgical site before the limb was exsanguinated with an Esmarch and the tourniquet inflated to 325 mmHg.   A standard anterior approach to the knee was made through an approximately 7-8 inch incision. The incision was carried down through the subcutaneous tissues to expose superficial retinaculum. This was split the length of the incision and the medial  flap elevated sufficiently to expose the medial retinaculum. The medial retinaculum was incised, leaving a 3-4 mm cuff of tissue on the patella. This was extended distally along the medial border of the patellar tendon and proximally through the medial third of the quadriceps tendon. A subtotal fat pad excision was performed before the soft tissues were elevated off the anteromedial and anterolateral aspects of the proximal tibia to the level of the collateral ligaments. The anterior portions of the medial and lateral menisci were removed, as was the anterior cruciate ligament. With the knee flexed to 90, the external tibial guide was positioned and the appropriate proximal tibial cut made. This piece was taken to the back table where it was measured and found to be optimally replicated by a(n) E-sized component.  Attention was directed to the distal femur. The intramedullary canal was accessed through a 3/8 drill hole. The intramedullary guide was inserted and placed at 5 of valgus alignment. Using the +0 slot, the distal cut was made. The distal femur was measured and found to be optimally replicated by the #7 component. The #7 4-in-1 cutting block was positioned and first the posterior, then the posterior chamfer, the anterior, and finally the anterior chamfer cuts were made after verifying that the anterior cortex would not be notched.   Given the size of the patient, the need for a stemmed tibial component, and the amount of medial subluxation of the femur on the tibia that the patient had preoperatively, it was felt best to proceed with  a posterior stabilized knee replacement. Therefore, after positioning the #7 trial femoral component on the distal femur, a box cut was made in the notch using the appropriate supplemental jigs.  This additional procedure, in addition to adding the 30 mm stem to the tibial component and the size of the patient all contributed to the difficulty of this surgery and added  30 to 40 minutes of extra time to complete the procedure.  At this point, the posterior portions medial and lateral menisci were removed. A trial reduction was performed using the appropriate femoral and tibial components with first the 10 mm and then the 11 mm insert. The 11 mm insert demonstrated excellent stability to varus and valgus stressing both in flexion and extension while permitting full extension. Patellar tracking was assessed and found to be excellent. Therefore, the tibial trial position was marked on the proximal tibia. The patella thickness was measured and found to be 22 mm. Therefore, the appropriate cut was made. The patellar surface was measured and found to be optimally replicated by the 32 mm component. The three peg holes were drilled in place before the trial button was inserted. Patella tracking was assessed and found to be excellent, passing the no thumb test. The lug holes were drilled into the distal femur before the trial component was removed.  The tibial tray was repositioned before the keel was created using the appropriate tower, reamer, and punch.  The bony surfaces were prepared for cementing by irrigating them thoroughly with sterile saline solution via the jet lavage system. A bone plug was fashioned from some of the bone that had been removed previously and used to plug the distal femoral canal. In addition, a cocktail of 20 cc of Exparel , 30 cc of 0.5% Sensorcaine , 2 cc of Kenalog 40 (80 mg), and 30 mg of Toradol diluted out to 90 cc with normal saline was injected into the postero-medial and postero-lateral aspects of the knee, the medial and lateral gutter regions, and the peri-incisional tissues to help with postoperative analgesia. Meanwhile, the cement was being mixed on the back table.   When the cement was ready, the tibial tray was cemented in first. The excess cement was removed using Personal assistant. Next, the femoral component was impacted into place.  Again, the excess cement was removed using Personal assistant. The 11 mm trial insert was positioned and the knee brought into extension while the cement hardened. Finally, the patella was cemented into place and secured using the patellar clamp. Again, the excess cement was removed using Personal assistant. Once the cement had hardened, the knee was placed through a range of motion with the findings as described above. Therefore, the trial insert was removed and, after verifying that no cement had been retained posteriorly, the permanent 11 mm posterior stabilized E-polyethylene insert was snapped into place with care taken to ensure appropriate locking of the insert. Again the knee was placed through a range of motion with the findings as described above.  The wound was copiously irrigated with sterile saline solution using the jet lavage system before the quadriceps tendon and retinacular layer were reapproximated using #0 Vicryl interrupted sutures. The superficial retinacular layer also was closed using a running #0 Vicryl suture. The subcutaneous tissues were closed in several layers using 2-0 Vicryl interrupted sutures. The skin was closed using staples. A sterile honeycomb dressing was applied to the skin before the leg was wrapped with an Ace wrap to accommodate the Polar Care device. The patient was  then awakened and returned to the recovery room in satisfactory condition after tolerating the procedure well.

## 2023-12-17 NOTE — H&P (Signed)
 History of Present Illness: Colin Mccoy is a 73 y.o.male who is here for history and physical for an upcoming right total knee arthroplasty to be done by Dr. Edie on December 17, 2023. The patient saw Dr. Edie back in September for persistent right knee pain. The symptoms began many years ago and developed without any specific cause or injury. He saw myself, in 2021 for the symptoms, then followed up with Dr. Hooten last year. He recommended that the patient work on weight loss, but the patient was not able to do so. Therefore he has elected to contact me to discuss further treatment options. The patient has undergone a left total knee arthroplasty by Dr. Kathlynn in 2019, but this procedure was complicated by a laterally dislocated patella which failed an attempted reconstructive procedure. He continues to note intermittent pain in his left knee. On today's visit, he reports 10/10 pain. The pain is located along the medial aspect of the knee. The pain is described as aching, crushing, stabbing, and throbbing. The symptoms are aggravated constantly, with normal daily activities, with sleeping, using stairs, at higher levels of activity, rising from a chair, walking, standing, and activity in general. He also describes crepitance and feelings as though his knee wants to give way on him. He has associated swelling and deformity. He ambulates with a cane for balance and support. He is unable to reciprocate stairs. He has tried acetaminophen , over-the-counter medications, and a home exercise program with no substantial relief. The patient is a diabetic.  Current Outpatient Medications:  atorvastatin  (LIPITOR) 80 MG tablet Take 1 tablet by mouth once daily  cholecalciferol (VITAMIN D3) 1000 unit tablet Take 1 tablet by mouth once daily  cyanocobalamin (VITAMIN B12) 1000 MCG tablet Take 1,000 mcg by mouth once daily  docusate (COLACE) 100 MG capsule Take 100 mg by mouth 2 (two) times daily.  dorzolamide-timoloL  (COSOPT) 22.3-6.8 mg/mL ophthalmic solution Place 1 drop into both eyes 2 (two) times daily  empagliflozin (JARDIANCE) 10 mg tablet Take 1 tablet by mouth once daily  escitalopram oxalate (LEXAPRO) 10 MG tablet Take 1 tablet (10 mg total) by mouth once daily 90 tablet 2  FUROsemide  (LASIX ) 20 MG tablet Take 40 mg by mouth once daily  latanoprost (XALATAN) 0.005 % ophthalmic solution Place 1 drop into both eyes at bedtime  losartan  (COZAAR ) 100 MG tablet Take 100 mg by mouth once daily  meclizine  (ANTIVERT ) 25 mg tablet Take 25 mg by mouth 3 (three) times daily as needed for Dizziness.  nitroGLYcerin  (NITROSTAT ) 0.4 MG SL tablet Place 0.4 mg under the tongue every 5 (five) minutes as needed for Chest pain. Reported on 04/03/2015 omega 3-dha-epa-fish oil (FISH OIL) 120-180-500 mg Cap Take 1 capsule by mouth once daily  omeprazole (PRILOSEC) 20 MG DR capsule Take 20 mg by mouth 2 (two) times daily  polyethylene glycol (MIRALAX ) packet Take 17 g by mouth once daily. Mix in 4-8ounces of fluid prior to taking.  pravastatin  (PRAVACHOL ) 80 MG tablet Take 80 mg by mouth nightly.  tamsulosin  (FLOMAX ) 0.4 mg capsule Take 0.4 mg by mouth as needed. Take 30 minutes after same meal each day.  Allergies: No Known Allergies  Past Medical History:  Chest pain  Constipation  Dizziness  Edema leg  Elevated prostate specific antigen (PSA) 07/20/2013  Fatigue  HTN (hypertension) 09/29/2014  Hypercholesteremia  Hypertension  Knee pain  Myalgia  Obesity  Onychomycosis  Osteoarthritis  Patellar dislocation 04/07/2017  Prediabetes  Prostate cancer (CMS/HHS-HCC)  Sleep  apnea  Tubular adenoma of colon 09/09/2016  URI (upper respiratory infection)  Urinary frequency  Vitamin D deficiency   Past Surgical History:  NEPHRECTOMY PARTIAL Left 1996  COLONOSCOPY 09/09/2016 (Tubular adenoma of colon/Repeat 19yrs/MUS)  Total knee arhtroplasty Left 02/19/2017 (Dr. Kathlynn)  Left medial retinacular repair sx  04/07/17   Family History:  Problem Relation Name Age of Onset  Colon cancer Mother  Prostate cancer Father   Social History:   Socioeconomic History:  Marital status: Married  Spouse name: Delshawn Stech  Number of children: 2  Years of education: 14  Occupational History  Occupation: Retired  Tobacco Use  Smoking status: Never  Passive exposure: Never  Smokeless tobacco: Never  Vaping Use  Vaping status: Never Used  Substance and Sexual Activity  Alcohol use: Yes  Alcohol/week: 0.0 standard drinks of alcohol  Comment: occasionnaly  Drug use: No  Sexual activity: Yes  Partners: Female   Social Drivers of Health:   Physicist, Medical Strain: Medium Risk (11/09/2023)  Overall Financial Resource Strain (CARDIA)  Difficulty of Paying Living Expenses: Somewhat hard  Food Insecurity: No Food Insecurity (11/09/2023)  Hunger Vital Sign  Worried About Running Out of Food in the Last Year: Never true  Ran Out of Food in the Last Year: Never true  Transportation Needs: No Transportation Needs (11/09/2023)  PRAPARE - Risk Analyst (Medical): No  Lack of Transportation (Non-Medical): No   Review of Systems:  A comprehensive 14 point ROS was performed, reviewed, and the pertinent orthopaedic findings are documented in the HPI.  Physical Exam: Vitals:  12/07/23 0957 12/07/23 0958  BP: 128/70  Weight: (!) 151.5 kg (334 lb)  Height: 175.3 cm (5' 9)  PainSc: 6 6  PainLoc: Knee Knee   General/Constitutional: Significantly overweight elderly male in no acute distress. Neuro/Psych: Normal mood and affect, oriented to person, place and time. Eyes: Non-icteric. Pupils are equal, round, and reactive to light, and exhibit synchronous movement. Lymphatic: No palpable adenopathy. Respiratory: Lungs clear to auscultation, Normal chest excursion, No wheezes, and Non-labored breathing Cardiovascular: Regular rate and rhythm. No murmurs. and No edema, swelling or  tenderness, except as noted in detailed exam. Vascular: No edema, swelling or tenderness, except as noted in detailed exam. Integumentary: No impressive skin lesions present, except as noted in detailed exam. Musculoskeletal: Unremarkable, except as noted in detailed exam.  Heart: Examination of the heart reveals regular, rate, and rhythm. There is no murmur noted on ascultation. There is a normal apical pulse.  Lungs: Lungs are clear to auscultation. There is no wheeze, rhonchi, or crackles. There is normal expansion of bilateral chest walls.   Right knee exam: GAIT: antalgic and uses a walker. ALIGNMENT: significant varus > 10 degrees SKIN: unremarkable SWELLING: mild EFFUSION: 1+ WARMTH: no warmth TENDERNESS: moderate tenderness along medial joint line, mild tenderness along lateral joint line ROM: 5 to 100 degrees with mild pain in maximal flexion McMURRAY'S: Not evaluated PATELLOFEMORAL: normal tracking with no peri-patellar tenderness and negative apprehension sign CREPITUS: Mild patellofemoral crepitance LACHMAN'S: negative PIVOT SHIFT: Not evaluated ANTERIOR DRAWER: negative POSTERIOR DRAWER: negative VARUS/VALGUS: positive pseudolaxity to varus stressing  He is grossly neurovascularly intact to the right lower extremity and foot, but does exhibit some chronic venous stasis changes of both lower legs.  Imaging: AP weightbearing of both knees, as well as lateral and merchant views of the right knee are obtained. These films demonstrate severe degenerative changes, primarily involving the medial compartment with 100% medial joint space narrowing.  There also is moderate medial displacement of the distal femur in relation to the tibia on the AP view. Lesser degenerative changes of the lateral and patellofemoral compartments also are noted. Overall alignment is significant varus. No fractures, lytic lesions, or abnormal calcifications are identified.  Assessment:  1. Primary  osteoarthritis of right knee.  2. Type 2 diabetes mellitus with other specified complication.  3. Morbid obesity with body mass index (BMI) of 40.0 or higher.   Plan: The treatment options were discussed with the patient. In addition, patient educational materials were provided regarding the diagnosis and treatment options. The patient is quite frustrated by his symptoms and functional limitations, and would like to consider more aggressive treatment options. I have explained to him the much higher risk he would have if we proceeded with a total knee arthroplasty, given his size. However, the patient is extremely frustrated by his continued symptoms and functional limitations and is willing to take these risks. Therefore, I have recommended a surgical procedure, specifically a right total knee arthroplasty. The procedure was discussed with the patient, as were the potential risks (including bleeding, infection, nerve and/or blood vessel injury, persistent or recurrent pain, loosening and/or failure of the components, dislocation, need for further surgery, blood clots, strokes, heart attacks and/or arhythmias, pneumonia, etc.) and benefits. The patient states his understanding and wishes to proceed.    H&P reviewed and patient re-examined. No changes.

## 2023-12-18 ENCOUNTER — Encounter: Payer: Self-pay | Admitting: Surgery

## 2023-12-18 DIAGNOSIS — M1711 Unilateral primary osteoarthritis, right knee: Secondary | ICD-10-CM | POA: Diagnosis not present

## 2023-12-18 LAB — CBC
HCT: 32.7 % — ABNORMAL LOW (ref 39.0–52.0)
Hemoglobin: 10.9 g/dL — ABNORMAL LOW (ref 13.0–17.0)
MCH: 32.6 pg (ref 26.0–34.0)
MCHC: 33.3 g/dL (ref 30.0–36.0)
MCV: 97.9 fL (ref 80.0–100.0)
Platelets: 158 K/uL (ref 150–400)
RBC: 3.34 MIL/uL — ABNORMAL LOW (ref 4.22–5.81)
RDW: 13.6 % (ref 11.5–15.5)
WBC: 9.8 K/uL (ref 4.0–10.5)
nRBC: 0 % (ref 0.0–0.2)

## 2023-12-18 LAB — BASIC METABOLIC PANEL WITH GFR
Anion gap: 14 (ref 5–15)
BUN: 24 mg/dL — ABNORMAL HIGH (ref 8–23)
CO2: 22 mmol/L (ref 22–32)
Calcium: 8.6 mg/dL — ABNORMAL LOW (ref 8.9–10.3)
Chloride: 103 mmol/L (ref 98–111)
Creatinine, Ser: 1.7 mg/dL — ABNORMAL HIGH (ref 0.61–1.24)
GFR, Estimated: 42 mL/min — ABNORMAL LOW (ref >60)
Glucose, Bld: 145 mg/dL — ABNORMAL HIGH (ref 70–99)
Potassium: 4.4 mmol/L (ref 3.5–5.1)
Sodium: 139 mmol/L (ref 135–145)

## 2023-12-18 MED ORDER — ONDANSETRON HCL 4 MG PO TABS: 4.0000 mg | ORAL_TABLET | Freq: Four times a day (QID) | ORAL | 0 refills | Status: AC | PRN

## 2023-12-18 MED ORDER — APIXABAN 2.5 MG PO TABS: 2.5000 mg | ORAL_TABLET | Freq: Two times a day (BID) | ORAL | 0 refills | Status: AC

## 2023-12-18 MED ORDER — OXYCODONE HCL 5 MG PO TABS: 5.0000 mg | ORAL_TABLET | ORAL | 0 refills | Status: AC | PRN

## 2023-12-18 NOTE — Discharge Instructions (Signed)

## 2023-12-18 NOTE — Plan of Care (Signed)

## 2023-12-18 NOTE — Progress Notes (Signed)
 This patient is not able to walk the distance required to go the bathroom, or he is unable to safely negotiate stairs required to access the bathroom.  A 3-in-1 bariatric BSC will alleviate this problem.

## 2023-12-18 NOTE — Progress Notes (Signed)
 Physical Therapy Treatment Patient Details Name: Colin EHRMANN Sr. MRN: [id number] DOB: 09-28-1950 Today's Date: 12/18/2023   History of Present Illness Colin Mccoy is a 73 y.o.male who is here for right total knee arthroplasty to be done by Dr. Edie.    PT Comments  Arrived to pt sitting in recliner. Pt stated that he had enjoyed his hour long show. He stated that he was open to doing exercises, walking, and/or both. Pt was A and O and eager to strengthen his knee. Walked pt through entire TKR packet and provided pt with cues of proper positioning at home for do's/don'ts. Did 5 or 10 reps of each exercise to assure proper form. Sit to stand transfer was improved even from earlier today with PTA providing CGA. Ambulated with BRW 160 ft. Pt began with step to pattern but progressed to step through halfway through. Cadence was improved. Provided verbal cues to improve heel strike and toe off phase. Continue with plan of care. Discharge recs still apply.   If plan is discharge home, recommend the following: A little help with walking and/or transfers;A little help with bathing/dressing/bathroom;Assistance with cooking/housework;Assist for transportation;Help with stairs or ramp for entrance   Can travel by private vehicle        Equipment Recommendations  Rolling walker (2 wheels);BSC/3in1    Recommendations for Other Services       Precautions / Restrictions Precautions Precautions: Fall Recall of Precautions/Restrictions: Intact Restrictions Weight Bearing Restrictions Per Provider Order: Yes RLE Weight Bearing Per Provider Order: Weight bearing as tolerated     Mobility  Bed Mobility               General bed mobility comments: Pt was in recliner upon arrival    Transfers Overall transfer level: Needs assistance Equipment used: Rolling walker (2 wheels) (BRW) Transfers: Sit to/from Stand Sit to Stand: Supervision, Contact guard assist           General transfer  comment: Performed sit to stand well, used CGA to help patient get out of recliner due to their size    Ambulation/Gait Ambulation/Gait assistance: Supervision Gait Distance (Feet): 160 Feet Assistive device: Rolling walker (2 wheels) (BRW) Gait Pattern/deviations: Step-to pattern, Step-through pattern (Began with step-to but progressed to step-through increasing his pace)           Stairs Stairs:  (Has ramp to enter home)          Balance Overall balance assessment: Needs assistance Sitting-balance support: Feet supported, Bilateral upper extremity supported Sitting balance-Leahy Scale: Good     Standing balance support: During functional activity, Reliant on assistive device for balance Standing balance-Leahy Scale: Good Standing balance comment: Standing balance was very well within the BRW            Communication Communication Communication: No apparent difficulties  Cognition Arousal: Alert Behavior During Therapy: WFL for tasks assessed/performed   PT - Cognitive impairments: No apparent impairments         PT - Cognition Comments: Very alert and talkative Following commands: Intact      Cueing Cueing Techniques: Verbal cues  Exercises Total Joint Exercises Ankle Circles/Pumps: AROM, Right, 10 reps Quad Sets: AROM, Right, 10 reps Towel Squeeze: AROM, 5 reps Short Arc Quad: AROM, Right, 10 reps Heel Slides: AROM, 10 reps, AAROM Hip ABduction/ADduction: AROM, 5 reps Straight Leg Raises: AROM, 10 reps Long Arc Quad: AROM, 5 reps Knee Flexion: AROM, Right, 10 reps    General Comments General comments (  skin integrity, edema, etc.): Meausred pt's kneef flexion to be ~110. Went through entire TKR packet before ambulating 160 ft.      Pertinent Vitals/Pain Pain Assessment Pain Assessment: No/denies pain Pain Score: 0-No pain Pain Location: R knee Pain Descriptors / Indicators: Discomfort Pain Intervention(s): Monitored during session, Limited  activity within patient's tolerance, Ice applied     PT Goals (current goals can now be found in the care plan section) Acute Rehab PT Goals Patient Stated Goal: Stated that he was happy staying one more night but ready to go home tomorrow. Progress towards PT goals: Progressing toward goals    Frequency    BID       AM-PAC PT 6 Clicks Mobility   Outcome Measure  Help needed turning from your back to your side while in a flat bed without using bedrails?: A Little Help needed moving from lying on your back to sitting on the side of a flat bed without using bedrails?: A Little Help needed moving to and from a bed to a chair (including a wheelchair)?: A Little Help needed standing up from a chair using your arms (e.g., wheelchair or bedside chair)?: A Little Help needed to walk in hospital room?: A Little Help needed climbing 3-5 steps with a railing? : A Little 6 Click Score: 18    End of Session Equipment Utilized During Treatment: Gait belt Activity Tolerance: Patient tolerated treatment well;No increased pain Patient left: in chair;with call bell/phone within reach;with chair alarm set Nurse Communication: Mobility status;Patient requests pain meds PT Visit Diagnosis: Muscle weakness (generalized) (M62.81);Pain;Difficulty in walking, not elsewhere classified (R26.2) Pain - Right/Left: Right Pain - part of body: Knee     Time: 8483-8460 PT Time Calculation (min) (ACUTE ONLY): 23 min  Charges:    $Gait Training: 8-22 mins $Therapeutic Exercise: 8-22 mins PT General Charges $$ ACUTE PT VISIT: 1 Visit                     Signe Traveion Ruddock SPTA    Amye Grego 12/18/2023, 4:47 PM

## 2023-12-18 NOTE — Progress Notes (Signed)
 Physical Therapy Treatment Patient Details Name: Colin Mccoy. MRN: [id number] DOB: 08-28-1950 Today's Date: 12/18/2023   History of Present Illness JOSEANDRES MAZER is a 73 y.o.male who is here for right total knee arthroplasty to be done by Dr. Edie.    PT Comments  Pt was sitting in recliner upon arrival with knee flexion ~90 deg. Stated that he was having blurred vision this morning due to eye drops. BP was measured at 124/55. Pt stated that they were having no pain when at rest but complained of 9/10 in WB. He tolerated 160 ft of ambulation w/ BRW + supervision. No loss of balance or safety concerns. Pt is progressing towards current plan of care. Discharge recs remain appropriate.   If plan is discharge home, recommend the following: A little help with walking and/or transfers;A little help with bathing/dressing/bathroom;Assistance with cooking/housework;Assist for transportation;Help with stairs or ramp for entrance     Equipment Recommendations  Rolling walker (2 wheels);BSC/3in1 (BRW)       Precautions / Restrictions Precautions Precautions: Fall Recall of Precautions/Restrictions: Intact Restrictions Weight Bearing Restrictions Per Provider Order: Yes RLE Weight Bearing Per Provider Order: Weight bearing as tolerated     Mobility  Bed Mobility  General bed mobility comments: Pt was in recliner upon arrival    Transfers Overall transfer level: Needs assistance Equipment used: Rolling walker (2 wheels) (BRW) Transfers: Sit to/from Stand Sit to Stand: Min assist, Contact guard assist  General transfer comment: Performed sit to stand well, used minimal assist to help patient get out of recliner due to their size    Ambulation/Gait Ambulation/Gait assistance: Supervision   Assistive device: Rolling walker (2 wheels) (BRW) Gait Pattern/deviations: Step-through pattern    Stairs Stairs:  (Has ramp to enter home)      Balance Overall balance assessment: Needs  assistance Sitting-balance support: Feet supported, Bilateral upper extremity supported Sitting balance-Leahy Scale: Good     Standing balance support: During functional activity, Reliant on assistive device for balance Standing balance-Leahy Scale: Good       Communication Communication Communication: No apparent difficulties  Cognition Arousal: Alert Behavior During Therapy: WFL for tasks assessed/performed   PT - Cognitive impairments: No apparent impairments        PT - Cognition Comments: Very alert and talkative Following commands: Intact      Cueing Cueing Techniques: Verbal cues     General Comments General comments (skin integrity, edema, etc.): Pt was in ~ 90 deg of knee flexion, Review importance of exercise program packet. BP was taken to assure safety. Recorded as 124/55      Pertinent Vitals/Pain Pain Assessment Pain Assessment: 0-10 Pain Score: 0-No pain (No pain at rest, 10 while ambulating with BRW) Pain Location: R knee Pain Descriptors / Indicators: Discomfort Pain Intervention(s): Limited activity within patient's tolerance, Monitored during session, Repositioned, Ice applied     PT Goals (current goals can now be found in the care plan section) Acute Rehab PT Goals Patient Stated Goal: Patient can't rely on wife at home for help so he wants to be stronger and more independent Progress towards PT goals: Progressing toward goals    Frequency    BID       AM-PAC PT 6 Clicks Mobility   Outcome Measure  Help needed turning from your back to your side while in a flat bed without using bedrails?: A Little Help needed moving from lying on your back to sitting on the side of a flat bed  without using bedrails?: A Little Help needed moving to and from a bed to a chair (including a wheelchair)?: A Little Help needed standing up from a chair using your arms (e.g., wheelchair or bedside chair)?: A Little Help needed to walk in hospital room?: A  Little Help needed climbing 3-5 steps with a railing? : A Little 6 Click Score: 18    End of Session Equipment Utilized During Treatment: Gait belt Activity Tolerance: Patient tolerated treatment well;No increased pain Patient left: in chair;with call bell/phone within reach;with chair alarm set Nurse Communication: Mobility status;Patient requests pain meds PT Visit Diagnosis: Muscle weakness (generalized) (M62.81);Pain;Difficulty in walking, not elsewhere classified (R26.2) Pain - Right/Left: Right Pain - part of body: Knee     Time: 9144-9079 PT Time Calculation (min) (ACUTE ONLY): 25 min  Charges:    $Gait Training: 8-22 mins $Therapeutic Activity: 8-22 mins PT General Charges $$ ACUTE PT VISIT: 1 Visit                     Signe Havyn Ramo SPTA    Kitti Mcclish 12/18/2023, 10:14 AM

## 2023-12-18 NOTE — Progress Notes (Signed)
  Subjective: 1 Day Post-Op Procedure(s) (LRB): ARTHROPLASTY, KNEE, TOTAL (Right) Patient reports pain as mild.   Patient is well, and has had no acute complaints or problems PT and care management to assist with discharge planning. Negative for chest pain and shortness of breath Fever: no Gastrointestinal:Negative for nausea and vomiting  Objective: Vital signs in last 24 hours: Temp:  [97.8 F (36.6 C)-98.3 F (36.8 C)] 98.3 F (36.8 C) (10/30 2001) Pulse Rate:  [47-68] 51 (10/30 2001) Resp:  [15-22] 16 (10/30 2001) BP: (76-110)/(33-61) 109/59 (10/30 2001) SpO2:  [97 %-100 %] 97 % (10/30 2001)  Intake/Output from previous day:  Intake/Output Summary (Last 24 hours) at 12/18/2023 0744 Last data filed at 12/18/2023 0450 Gross per 24 hour  Intake 1474.71 ml  Output 1560 ml  Net -85.29 ml    Intake/Output this shift: No intake/output data recorded.  Labs: Recent Labs    12/18/23 0327  HGB 10.9*   Recent Labs    12/18/23 0327  WBC 9.8  RBC 3.34*  HCT 32.7*  PLT 158   Recent Labs    12/18/23 0327  NA 139  K 4.4  CL 103  CO2 22  BUN 24*  CREATININE 1.70*  GLUCOSE 145*  CALCIUM  8.6*   No results for input(s): LABPT, INR in the last 72 hours.   EXAM General - Patient is Alert, Appropriate, and Oriented Extremity - ABD soft Neurovascular intact Dorsiflexion/Plantar flexion intact Incision: scant drainage No cellulitis present Dressing/Incision - Mild bloody drainage noted distally along the right knee incision site. Motor Function - intact, moving foot and toes well on exam.  Abdomen soft with intact bowel sounds. Negative Homans test.  Past Medical History:  Diagnosis Date   Arthritis    BPH (benign prostatic hyperplasia)    Cancer of left kidney (HCC) 1996   Chronic heart failure with preserved ejection fraction (HCC)    Diabetes mellitus without complication (HCC)    Diaphragm, eventration    Full dentures    GERD (gastroesophageal  reflux disease)    Grade I diastolic dysfunction    High cholesterol    History of kidney stones    Hypertension    Obese    OSA on CPAP    Pre-diabetes    Prostate cancer (HCC) 2015   Vitamin D deficiency     Assessment/Plan: 1 Day Post-Op Procedure(s) (LRB): ARTHROPLASTY, KNEE, TOTAL (Right) Principal Problem:   Status post total knee replacement using cement, right  Estimated body mass index is 49.18 kg/m as calculated from the following:   Height as of 12/09/23: 5' 9 (1.753 m).   Weight as of 12/09/23: 151 kg. Advance diet Up with therapy D/C IV fluids when tolerating po intake.  Labs and vitals reviewed, Hg 10.9.  WBC 9.8. Up with therapy today, was able to walk 125 feet yesterday. Minimal support at home, does have a ramp to get into his house. Plan for possible d/c home with HHPT today or tomorrow pending progress with PT.  DVT Prophylaxis - Eliquis and SCDs Weight-Bearing as tolerated to right leg  J. Gustavo Level, PA-C [facility] Orthopaedic Surgery 12/18/2023, 7:44 AM

## 2023-12-18 NOTE — Anesthesia Postprocedure Evaluation (Signed)
 Anesthesia Post Note  Patient: MELROY BOUGHER Sr.  Procedure(s) Performed: ARTHROPLASTY, KNEE, TOTAL (Right: Knee)  Patient location during evaluation: Nursing Unit Anesthesia Type: Spinal Level of consciousness: oriented and awake and alert Pain management: pain level controlled Vital Signs Assessment: post-procedure vital signs reviewed and stable Respiratory status: spontaneous breathing and respiratory function stable Cardiovascular status: blood pressure returned to baseline and stable Postop Assessment: no headache, no backache and no apparent nausea or vomiting Anesthetic complications: no   No notable events documented.   Last Vitals:  Vitals:   12/17/23 1634 12/17/23 2001  BP: 110/61 (!) 109/59  Pulse: (!) 58 (!) 51  Resp: 17 16  Temp:  36.8 C  SpO2: 97% 97%    Last Pain:  Vitals:   12/18/23 0601  TempSrc:   PainSc: 1                  Jaylene Delon HERO

## 2023-12-18 NOTE — Discharge Summary (Signed)
 Physician Discharge Summary  Patient ID: Colin Colin Sr. MRN: [id number] DOB/AGE: February 09, 1951 73 y.o.  Admit date: 12/17/2023 Discharge date: 12/19/2023  Admission Diagnoses:  Primary osteoarthritis of right knee [M17.11] Status post total knee replacement using cement, right [Z96.651]  Discharge Diagnoses: Patient Active Problem List   Diagnosis Date Noted   Status post total knee replacement using cement, right 12/17/2023   BPH (benign prostatic hyperplasia) 07/30/2021   Heart failure with preserved ejection fraction (HCC) 07/30/2021   History of prostate cancer 07/30/2021   Sinus bradycardia by electrocardiogram 07/30/2021   Type 2 diabetes mellitus (HCC) 07/30/2021   Status post total left knee replacement 06/26/2017   Patellar dislocation 04/07/2017   Primary localized osteoarthritis of left knee 02/19/2017   Preop cardiovascular exam 11/27/2016   Hyperlipidemia 11/18/2016   Essential hypertension 11/18/2016   Bilateral lower extremity edema 11/18/2016   Absent pedal pulses 11/18/2016   Pedal edema 01/23/2016   SOB (shortness of breath) on exertion 01/23/2016   Vertigo 01/30/2015   Disequilibrium 01/30/2015   Dysuria 12/29/2014   Chest pain, unspecified 09/29/2014   Esophageal reflux 09/29/2014   Exogenous obesity 09/29/2014   Fatigue 09/29/2014   Malignant neoplasm of prostate (HCC) 09/29/2014   Primary osteoarthritis of both knees 04/06/2014   Elevated prostate specific antigen (PSA) 07/20/2013    Past Medical History:  Diagnosis Date   Arthritis    BPH (benign prostatic hyperplasia)    Cancer of left kidney (HCC) 1996   Chronic heart failure with preserved ejection fraction (HCC)    Diabetes mellitus without complication (HCC)    Diaphragm, eventration    Full dentures    GERD (gastroesophageal reflux disease)    Grade I diastolic dysfunction    High cholesterol    History of kidney stones    Hypertension    Obese    OSA on CPAP    Pre-diabetes     Prostate cancer (HCC) 2015   Vitamin D deficiency      Transfusion: None.   Consultants (if any):   Discharged Condition: Improved  Hospital Course: Colin DEYTON Sr. is an 73 y.o. male who was admitted 12/17/2023 with a diagnosis of primary osteoarthritis of the right knee and went to the operating room on 12/17/2023 and underwent the above named procedures.    Surgeries: Procedure(s): ARTHROPLASTY, KNEE, TOTAL on 12/17/2023 Patient tolerated the surgery well. Taken to PACU where he was stabilized and then transferred to the orthopedic floor.  Started on Eliquis 2.5mg  every 12 hrs. Heels elevated on bed with rolled towels. No evidence of DVT. Negative Homan. Physical therapy started on day #0 for gait training and transfer. OT started day #1 for ADL and assisted devices.  Patient's IV was removed on POD2.  On postop day 2 patient did well with physical therapy.  Labs and vital signs are stable.  He is stable and ready for discharge to home with home health PT.  Implants: Right TKA using all-cemented Zimmer Persona PS system with a #7 PCR femur, a(n) E-sized tibial tray with an 11 mm posterior stabilized E-poly insert, and an 8.5 x 32 mm all-poly 3-pegged domed patella.    He was given perioperative antibiotics:  Anti-infectives (From admission, onward)    Start     Dose/Rate Route Frequency Ordered Stop   12/17/23 1400  ceFAZolin  (ANCEF ) IVPB 3g/150 mL premix        3 g 300 mL/hr over 30 Minutes Intravenous Every 6 hours 12/17/23 1239 12/17/23 2247  12/17/23 0600  ceFAZolin  (ANCEF ) IVPB 2g/100 mL premix        2 g 200 mL/hr over 30 Minutes Intravenous On call to O.R. 12/17/23 0559 12/17/23 9191     .  He was given sequential compression devices, early ambulation, and Eliquis for DVT prophylaxis.  He benefited maximally from the hospital stay and there were no complications.    Recent vital signs:  Vitals:   12/19/23 0503 12/19/23 0805  BP: (!) 111/45 132/76  Pulse: (!)  56 62  Resp: 19 17  Temp:  98 F (36.7 C)  SpO2: 99% 99%    Recent laboratory studies:  Lab Results  Component Value Date   HGB 11.6 (L) 12/19/2023   HGB 10.9 (L) 12/18/2023   HGB 11.8 (L) 12/09/2023   Lab Results  Component Value Date   WBC 9.1 12/19/2023   PLT 199 12/19/2023   Lab Results  Component Value Date   INR 1.17 02/04/2017   Lab Results  Component Value Date   NA 139 12/18/2023   K 4.4 12/18/2023   CL 103 12/18/2023   CO2 22 12/18/2023   BUN 24 (H) 12/18/2023   CREATININE 1.70 (H) 12/18/2023   GLUCOSE 145 (H) 12/18/2023    Discharge Medications:   Allergies as of 12/19/2023   No Known Allergies      Medication List     TAKE these medications    acetaminophen  500 MG tablet Commonly known as: TYLENOL  Take 500 mg by mouth 2 (two) times daily.   apixaban 2.5 MG Tabs tablet Commonly known as: ELIQUIS Take 1 tablet (2.5 mg total) by mouth 2 (two) times daily.   atorvastatin  40 MG tablet Commonly known as: LIPITOR Take 40 mg by mouth at bedtime.   buPROPion 150 MG 24 hr tablet Commonly known as: WELLBUTRIN XL Take 150 mg by mouth daily.   docusate sodium  100 MG capsule Commonly known as: COLACE Take 100 mg by mouth daily.   dorzolamide-timolol 2-0.5 % ophthalmic solution Commonly known as: COSOPT Place 1 drop into both eyes 2 (two) times daily.   ezetimibe 10 MG tablet Commonly known as: ZETIA Take 10 mg by mouth at bedtime.   gabapentin 100 MG capsule Commonly known as: NEURONTIN Take 100 mg by mouth daily.   latanoprost 0.005 % ophthalmic solution Commonly known as: XALATAN Place 1 drop into both eyes at bedtime.   losartan  25 MG tablet Commonly known as: COZAAR  Take 25 mg by mouth daily.   meclizine  25 MG tablet Commonly known as: ANTIVERT  Take 1 tablet (25 mg total) by mouth 2 (two) times daily as needed for dizziness.   nitroGLYCERIN  0.4 MG SL tablet Commonly known as: NITROSTAT  Place 0.4 mg under the tongue every 5  (five) minutes as needed for chest pain.   omega-3 fish oil 1000 MG Caps capsule Commonly known as: MAXEPA Take 1 capsule by mouth daily.   omeprazole 40 MG capsule Commonly known as: PRILOSEC Take 40 mg by mouth daily.   ondansetron  4 MG tablet Commonly known as: ZOFRAN  Take 1 tablet (4 mg total) by mouth every 6 (six) hours as needed for nausea.   oxyCODONE  5 MG immediate release tablet Commonly known as: Oxy IR/ROXICODONE  Take 1-2 tablets (5-10 mg total) by mouth every 4 (four) hours as needed for moderate pain (pain score 4-6).   polyethylene glycol 17 g packet Commonly known as: MIRALAX  / GLYCOLAX  Take 17 g by mouth daily.   spironolactone 25 MG tablet Commonly  known as: ALDACTONE Take 25 mg by mouth daily.   tamsulosin  0.4 MG Caps capsule Commonly known as: FLOMAX  Take 0.4 mg by mouth daily.   torsemide 20 MG tablet Commonly known as: DEMADEX Take 40 mg by mouth 2 (two) times daily.               Durable Medical Equipment  (From admission, onward)           Start     Ordered   12/19/23 0932  DME 3 n 1  Once       Comments: bariatric   12/19/23 0931   12/19/23 0929  DME Walker rolling  Once       Comments: Bariatric walker  Question Answer Comment  Walker: With 5 Inch Wheels   Patient needs a walker to treat with the following condition Status post total knee replacement using cement, right      12/19/23 0931            Diagnostic Studies: DG Knee Right Port Result Date: 12/17/2023 EXAM: 2 VIEW(S) XRAY OF THE RIGHT KNEE 12/17/2023 10:51:00 AM COMPARISON: None available. CLINICAL HISTORY: Status post total knee replacement using cement, right. FINDINGS: BONES AND JOINTS: Right total knee arthroplasty in expected alignment. No periprosthetic lucency or fracture. SOFT TISSUES: Recent postsurgical changes include air and edema in the soft tissues and anterior skin staples in place. IMPRESSION: 1. Right total knee arthroplasty without immediate  post operative complications. Electronically signed by: Andrea Gasman MD 12/17/2023 12:30 PM EDT RP Workstation: [id number]   Disposition: Discharge disposition: 06-Home-Health Care Svc     Plan for possible d/c home today or tomorrow pending progress with PT.   Follow-up Information     Kip Lynwood Double, PA-C Follow up in 14 day(s).   Specialty: Physician Assistant Why: O[facility] [place] information: [address] [place] [address] [phone]                Signed: Debby JAYSON Amber PA-C 12/19/2023, 9:43 AM

## 2023-12-18 NOTE — Progress Notes (Signed)
 This patient has mobility impairment for daily activities. A bariatric rolling walker will resolve this and the patient is safe to use it.

## 2023-12-19 DIAGNOSIS — M1711 Unilateral primary osteoarthritis, right knee: Secondary | ICD-10-CM | POA: Diagnosis not present

## 2023-12-19 LAB — CBC
HCT: 34.3 % — ABNORMAL LOW (ref 39.0–52.0)
Hemoglobin: 11.6 g/dL — ABNORMAL LOW (ref 13.0–17.0)
MCH: 32.6 pg (ref 26.0–34.0)
MCHC: 33.8 g/dL (ref 30.0–36.0)
MCV: 96.3 fL (ref 80.0–100.0)
Platelets: 199 K/uL (ref 150–400)
RBC: 3.56 MIL/uL — ABNORMAL LOW (ref 4.22–5.81)
RDW: 13.9 % (ref 11.5–15.5)
WBC: 9.1 K/uL (ref 4.0–10.5)
nRBC: 0 % (ref 0.0–0.2)

## 2023-12-19 NOTE — Progress Notes (Signed)
 Patient is not able to walk the distance required to go the bathroom, or he is unable to safely negotiate stairs required to access the bathroom.  A 3in1 BSC will alleviate this problem.        Lollie Marrow, PA-C [facility]

## 2023-12-19 NOTE — Progress Notes (Signed)
   Subjective: 2 Days Post-Op Procedure(s) (LRB): ARTHROPLASTY, KNEE, TOTAL (Right) Patient reports pain as mild.   Patient is well, and has had no acute complaints or problems Denies any CP, SOB, ABD pain. We will continue therapy today.  Plan is to go Home after hospital stay.  Objective: Vital signs in last 24 hours: Temp:  [98 F (36.7 C)-98.7 F (37.1 C)] 98 F (36.7 C) (11/01 0805) Pulse Rate:  [52-62] 62 (11/01 0805) Resp:  [16-20] 17 (11/01 0805) BP: (111-132)/(43-76) 132/76 (11/01 0805) SpO2:  [99 %-100 %] 99 % (11/01 0805)  Intake/Output from previous day: 10/31 0701 - 11/01 0700 In: 1476.2 [P.O.:240; I.V.:1236.2] Out: 2200 [Urine:2200] Intake/Output this shift: No intake/output data recorded.  Recent Labs    12/18/23 0327 12/19/23 0520  HGB 10.9* 11.6*   Recent Labs    12/18/23 0327 12/19/23 0520  WBC 9.8 9.1  RBC 3.34* 3.56*  HCT 32.7* 34.3*  PLT 158 199   Recent Labs    12/18/23 0327  NA 139  K 4.4  CL 103  CO2 22  BUN 24*  CREATININE 1.70*  GLUCOSE 145*  CALCIUM  8.6*   No results for input(s): LABPT, INR in the last 72 hours.  EXAM General - Patient is Alert, Appropriate, and Oriented Extremity - Neurovascular intact Sensation intact distally Intact pulses distally Dorsiflexion/Plantar flexion intact No cellulitis present Compartment soft Dressing - dressing C/D/I and no drainage Motor Function - intact, moving foot and toes well on exam.   Past Medical History:  Diagnosis Date   Arthritis    BPH (benign prostatic hyperplasia)    Cancer of left kidney (HCC) 1996   Chronic heart failure with preserved ejection fraction (HCC)    Diabetes mellitus without complication (HCC)    Diaphragm, eventration    Full dentures    GERD (gastroesophageal reflux disease)    Grade I diastolic dysfunction    High cholesterol    History of kidney stones    Hypertension    Obese    OSA on CPAP    Pre-diabetes    Prostate cancer (HCC)  2015   Vitamin D deficiency     Assessment/Plan:   2 Days Post-Op Procedure(s) (LRB): ARTHROPLASTY, KNEE, TOTAL (Right) Principal Problem:   Status post total knee replacement using cement, right  Estimated body mass index is 49.18 kg/m as calculated from the following:   Height as of 12/09/23: 5' 9 (1.753 m).   Weight as of 12/09/23: 151 kg. Advance diet Up with therapy Pain well controlled Labs and VSS Good progress with PT today DC home with HHPT today    DVT Prophylaxis - TED hose and SCDs Eliquis Weight-Bearing as tolerated to right leg   T. Medford Amber, PA-C [facility] 12/19/2023, 9:32 AM

## 2023-12-19 NOTE — TOC CM/SW Note (Signed)
 Rolling Walker: The beneficiary has a mobility limitation that significantly impairs his/her ability to participate in one or more mobility-related activities of daily living (MRADL) in the home. The patient is able to safely use the walker. The functional mobility deficit can be sufficiently resolved by use of walker.

## 2023-12-19 NOTE — Plan of Care (Signed)
   Problem: Education: Goal: Knowledge of General Education information will improve Description Including pain rating scale, medication(s)/side effects and non-pharmacologic comfort measures Outcome: Progressing   Problem: Health Behavior/Discharge Planning: Goal: Ability to manage health-related needs will improve Outcome: Progressing

## 2023-12-19 NOTE — TOC CM/SW Note (Signed)
 Patient is not able to walk the distance required to go the bathroom, or he/she is unable to safely negotiate stairs required to access the bathroom.  A 3in1 BSC will alleviate this problem

## 2023-12-19 NOTE — Care Management Obs Status (Signed)
 MEDICARE OBSERVATION STATUS NOTIFICATION   Patient Details  Name: Colin PALECEK Sr. MRN: [id number] Date of Birth: 08-Apr-1950   Medicare Observation Status Notification Given:  No patient admitted    Colin Mccoy 12/19/2023, 11:32 AM

## 2023-12-19 NOTE — Progress Notes (Signed)
 Physical Therapy Treatment Patient Details Name: Colin SONTAG Sr. MRN: [id number] DOB: 1950/09/04 Today's Date: 12/19/2023   History of Present Illness Colin Mccoy is a 73 y.o.male who is here for right total knee arthroplasty to be done by Dr. Edie.    PT Comments  PT was seated EOB upon arrival. Remains A and O x 4. Does endorse increased soreness today versus previous date however pain did not limit session progression. Pt was able to stand and ambulate ~ 200 ft with BRW. No LOB or safety concerns. Pt is cleared from an acute PT standpoint  for safe DC home    If plan is discharge home, recommend the following: A little help with walking and/or transfers;A little help with bathing/dressing/bathroom;Assistance with cooking/housework;Assist for transportation;Help with stairs or ramp for entrance     Equipment Recommendations  Rolling walker (2 wheels);BSC/3in1 (Bariatric)       Precautions / Restrictions Precautions Precautions: Fall Recall of Precautions/Restrictions: Intact Restrictions Weight Bearing Restrictions Per Provider Order: Yes RLE Weight Bearing Per Provider Order: Weight bearing as tolerated     Mobility  Bed Mobility  General bed mobility comments: pt was seated EOB pre session and in recliner post session    Transfers Overall transfer level: Modified independent Equipment used: Rolling walker (2 wheels) (Bariatric) Transfers: Sit to/from Stand Sit to Stand: Supervision   Ambulation/Gait Ambulation/Gait assistance: Supervision Gait Distance (Feet): 200 Feet Assistive device: Rolling walker (2 wheels) Gait Pattern/deviations: Step-through pattern, Antalgic  General Gait Details: Pt tolerated ambul;ation ~ 200 ft with BRW without LOB or safety concern.   Stairs Stairs:  (Pt has ramp entry but was educated on safe stair performance)       Balance Overall balance assessment: Needs assistance Sitting-balance support: Feet supported, Bilateral upper  extremity supported Sitting balance-Leahy Scale: Good     Standing balance support: Bilateral upper extremity supported, During functional activity, Reliant on assistive device for balance Standing balance-Leahy Scale: Good Standing balance comment: no LOB with BUE support       Communication Communication Communication: No apparent difficulties  Cognition Arousal: Alert Behavior During Therapy: WFL for tasks assessed/performed   PT - Cognitive impairments: No apparent impairments      PT - Cognition Comments: Pt is A and O x 4 Following commands: Intact      Cueing Cueing Techniques: Verbal cues         Pertinent Vitals/Pain Pain Assessment Pain Assessment: 0-10 Pain Score: 10-Worst pain ever Pain Location: R knee Pain Descriptors / Indicators: Discomfort Pain Intervention(s): Limited activity within patient's tolerance, Monitored during session, Premedicated before session, Repositioned, Ice applied     PT Goals (current goals can now be found in the care plan section) Acute Rehab PT Goals Patient Stated Goal: go home Progress towards PT goals: Progressing toward goals    Frequency    BID       AM-PAC PT 6 Clicks Mobility   Outcome Measure  Help needed turning from your back to your side while in a flat bed without using bedrails?: A Little Help needed moving from lying on your back to sitting on the side of a flat bed without using bedrails?: A Little Help needed moving to and from a bed to a chair (including a wheelchair)?: A Little Help needed standing up from a chair using your arms (e.g., wheelchair or bedside chair)?: A Little Help needed to walk in hospital room?: A Little Help needed climbing 3-5 steps with a railing? :  A Little 6 Click Score: 18    End of Session   Activity Tolerance: Patient tolerated treatment well Patient left: in chair;with call bell/phone within reach;with chair alarm set Nurse Communication: Mobility status;Patient  requests pain meds PT Visit Diagnosis: Muscle weakness (generalized) (M62.81);Pain;Difficulty in walking, not elsewhere classified (R26.2) Pain - Right/Left: Right Pain - part of body: Knee     Time: 9145-9077 PT Time Calculation (min) (ACUTE ONLY): 28 min  Charges:    $Gait Training: 8-22 mins $Therapeutic Activity: 8-22 mins PT General Charges $$ ACUTE PT VISIT: 1 Visit                    Rankin Essex PTA 12/19/23, 9:52 AM

## 2023-12-19 NOTE — TOC Transition Note (Signed)
 Transition of Care [facility]) - Discharge Note   Patient Details  Name: Colin SARR Sr. MRN: [id number] Date of Birth: [birth year]  [facility] CM/SW Contact:  Victory Jackquline RAMAN, RN Phone Number: 12/19/2023, 12:12 PM   Clinical Narrative:   Chart reviewed. Received a secure chat from PT stating patient needs a Bariatric RW and BSC. RW note completed and sent to MD to co-sign.  Pt discharging home with DME. Spoke to patient, informed him that the RW and [facility] will be delivered to his room. He informed me that his daughter will be picking him up. No further concerns. RNCM Signing off.     Final next level of care: Home/Self Care Barriers to Discharge: Barriers Resolved   Patient Goals and CMS Choice            Discharge Placement                Patient to be transferred to facility by: Daughter Name of family member notified: [name] Patient and family notified of of transfer: 12/19/23  Discharge Plan and Services Additional resources added to the After Visit Summary for                  DME Arranged: 3-N-1, Walker rolling (Bariactric RW and [facility]) DME Agency: [employer] Date DME Agency Contacted: 12/19/23   Representative spoke with at DME Agency: Requested via Email            Social Drivers of Health (SDOH) Interventions SDOH Screenings   Food Insecurity: No Food Insecurity (12/17/2023)  Housing: Unknown (12/17/2023)  Transportation Needs: No Transportation Needs (12/17/2023)  Utilities: Not At Risk (12/17/2023)  Financial Resource Strain: Medium Risk (11/09/2023)   Received from [facility]  Social Connections: Unknown (12/17/2023)  Tobacco Use: Low Risk  (12/17/2023)     Readmission Risk Interventions     No data to display

## 2024-01-06 ENCOUNTER — Other Ambulatory Visit: Payer: Self-pay | Admitting: Student

## 2024-01-06 ENCOUNTER — Ambulatory Visit
Admission: RE | Admit: 2024-01-06 | Discharge: 2024-01-06 | Disposition: A | Discharge status: Home / Self Care | Source: Ambulatory Visit | Attending: Student | Admitting: Student

## 2024-01-06 DIAGNOSIS — Z96651 Presence of right artificial knee joint: Secondary | ICD-10-CM | POA: Diagnosis present

## 2024-01-06 DIAGNOSIS — M7989 Other specified soft tissue disorders: Secondary | ICD-10-CM
